# Patient Record
Sex: Female | Born: 1935
Health system: Southern US, Community
[De-identification: ages and names within clinical notes are randomized; demographics above are authoritative.]

## PROBLEM LIST (undated history)

## (undated) DIAGNOSIS — I639 Cerebral infarction, unspecified: Secondary | ICD-10-CM

## (undated) DIAGNOSIS — K3532 Acute appendicitis with perforation and localized peritonitis, without abscess: Secondary | ICD-10-CM

## (undated) DIAGNOSIS — T148XXA Other injury of unspecified body region, initial encounter: Secondary | ICD-10-CM

## (undated) DIAGNOSIS — I4891 Unspecified atrial fibrillation: Secondary | ICD-10-CM

## (undated) DIAGNOSIS — F419 Anxiety disorder, unspecified: Secondary | ICD-10-CM

## (undated) DIAGNOSIS — F329 Major depressive disorder, single episode, unspecified: Secondary | ICD-10-CM

## (undated) DIAGNOSIS — M810 Age-related osteoporosis without current pathological fracture: Secondary | ICD-10-CM

## (undated) HISTORY — DX: Acute appendicitis with perforation and localized peritonitis, without abscess: K35.32

## (undated) HISTORY — DX: Major depressive disorder, single episode, unspecified: F32.9

## (undated) HISTORY — PX: PACEMAKER INSERTION: SHX728

## (undated) HISTORY — DX: Age-related osteoporosis without current pathological fracture: M81.0

## (undated) HISTORY — DX: Other injury of unspecified body region, initial encounter: T14.8XXA

## (undated) HISTORY — PX: INSERT / REPLACE / REMOVE PACEMAKER: SUR710

## (undated) HISTORY — DX: Anxiety disorder, unspecified: F41.9

---
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
## 2006-07-06 DIAGNOSIS — I639 Cerebral infarction, unspecified: Secondary | ICD-10-CM

## 2006-07-06 DIAGNOSIS — I4891 Unspecified atrial fibrillation: Secondary | ICD-10-CM

## 2006-07-06 HISTORY — DX: Cerebral infarction, unspecified: I63.9

## 2006-07-06 HISTORY — DX: Unspecified atrial fibrillation: I48.91

## 2011-05-18 LAB — PROTIME-INR: INR: 2.8 — AB (ref 0.9–1.1)

## 2011-11-21 ENCOUNTER — Inpatient Hospital Stay (HOSPITAL_COMMUNITY)
Admission: EM | Admit: 2011-11-21 | Discharge: 2011-11-27 | DRG: 340 | Disposition: A | Discharge status: Home / Self Care | Payer: Medicare Other | Source: Ambulatory Visit | Attending: Surgery | Admitting: Surgery

## 2011-11-21 ENCOUNTER — Emergency Department (HOSPITAL_COMMUNITY): Payer: Medicare Other

## 2011-11-21 ENCOUNTER — Encounter (HOSPITAL_COMMUNITY): Payer: Self-pay | Admitting: *Deleted

## 2011-11-21 DIAGNOSIS — Z950 Presence of cardiac pacemaker: Secondary | ICD-10-CM

## 2011-11-21 DIAGNOSIS — Z87891 Personal history of nicotine dependence: Secondary | ICD-10-CM

## 2011-11-21 DIAGNOSIS — K37 Unspecified appendicitis: Secondary | ICD-10-CM

## 2011-11-21 DIAGNOSIS — Z79899 Other long term (current) drug therapy: Secondary | ICD-10-CM

## 2011-11-21 DIAGNOSIS — K3532 Acute appendicitis with perforation and localized peritonitis, without abscess: Secondary | ICD-10-CM

## 2011-11-21 DIAGNOSIS — I482 Chronic atrial fibrillation, unspecified: Secondary | ICD-10-CM

## 2011-11-21 DIAGNOSIS — Z7901 Long term (current) use of anticoagulants: Secondary | ICD-10-CM

## 2011-11-21 DIAGNOSIS — I4891 Unspecified atrial fibrillation: Secondary | ICD-10-CM | POA: Diagnosis present

## 2011-11-21 DIAGNOSIS — Z8673 Personal history of transient ischemic attack (TIA), and cerebral infarction without residual deficits: Secondary | ICD-10-CM

## 2011-11-21 DIAGNOSIS — K3533 Acute appendicitis with perforation and localized peritonitis, with abscess: Principal | ICD-10-CM | POA: Diagnosis present

## 2011-11-21 HISTORY — DX: Unspecified atrial fibrillation: I48.91

## 2011-11-21 HISTORY — DX: Cerebral infarction, unspecified: I63.9

## 2011-11-21 LAB — DIFFERENTIAL
Basophils Absolute: 0.1 10*3/uL (ref 0.0–0.1)
Basophils Relative: 0 % (ref 0–1)
Lymphocytes Relative: 14 % (ref 12–46)
Monocytes Absolute: 1.2 10*3/uL — ABNORMAL HIGH (ref 0.1–1.0)
Monocytes Relative: 8 % (ref 3–12)
Neutro Abs: 12.3 10*3/uL — ABNORMAL HIGH (ref 1.7–7.7)
Neutrophils Relative %: 78 % — ABNORMAL HIGH (ref 43–77)

## 2011-11-21 LAB — COMPREHENSIVE METABOLIC PANEL
AST: 17 U/L (ref 0–37)
Albumin: 3.5 g/dL (ref 3.5–5.2)
Alkaline Phosphatase: 62 U/L (ref 39–117)
CO2: 26 mEq/L (ref 19–32)
Chloride: 100 mEq/L (ref 96–112)
GFR calc non Af Amer: 44 mL/min — ABNORMAL LOW (ref >90)
Potassium: 4.1 mEq/L (ref 3.5–5.1)
Total Bilirubin: 0.6 mg/dL (ref 0.3–1.2)

## 2011-11-21 LAB — CBC
HCT: 37.6 % (ref 36.0–46.0)
Hemoglobin: 12.4 g/dL (ref 12.0–15.0)
MCHC: 33 g/dL (ref 30.0–36.0)
RDW: 12.9 % (ref 11.5–15.5)
WBC: 15.8 10*3/uL — ABNORMAL HIGH (ref 4.0–10.5)

## 2011-11-21 LAB — URINALYSIS, ROUTINE W REFLEX MICROSCOPIC
Bilirubin Urine: NEGATIVE
Glucose, UA: NEGATIVE mg/dL
Hgb urine dipstick: NEGATIVE
Ketones, ur: 15 mg/dL — AB
Protein, ur: NEGATIVE mg/dL
pH: 6 (ref 5.0–8.0)

## 2011-11-21 LAB — APTT: aPTT: 38 seconds — ABNORMAL HIGH (ref 24–37)

## 2011-11-21 LAB — DIGOXIN LEVEL: Digoxin Level: 1 ng/mL (ref 0.8–2.0)

## 2011-11-21 LAB — POCT I-STAT TROPONIN I: Troponin i, poc: 0 ng/mL (ref 0.00–0.08)

## 2011-11-21 LAB — URINE MICROSCOPIC-ADD ON

## 2011-11-21 MED ORDER — IOHEXOL 300 MG/ML  SOLN
80.0000 mL | Freq: Once | INTRAMUSCULAR | Status: AC | PRN
  Administered 2011-11-21: 80 mL via INTRAVENOUS

## 2011-11-21 MED ORDER — SODIUM CHLORIDE 0.9 % IV SOLN
1.0000 g | INTRAVENOUS | Status: AC
  Administered 2011-11-21: 1 g via INTRAVENOUS
  Filled 2011-11-21: qty 1

## 2011-11-21 MED ORDER — ONDANSETRON HCL 4 MG/2ML IJ SOLN
4.0000 mg | Freq: Once | INTRAMUSCULAR | Status: AC
  Administered 2011-11-21: 4 mg via INTRAVENOUS
  Filled 2011-11-21: qty 2

## 2011-11-21 MED ORDER — MORPHINE SULFATE 4 MG/ML IJ SOLN
4.0000 mg | Freq: Once | INTRAMUSCULAR | Status: AC
  Administered 2011-11-21: 4 mg via INTRAVENOUS
  Filled 2011-11-21: qty 1

## 2011-11-21 MED ORDER — IOHEXOL 300 MG/ML  SOLN
20.0000 mL | INTRAMUSCULAR | Status: AC
  Administered 2011-11-21: 20 mL via ORAL

## 2011-11-21 MED ORDER — SODIUM CHLORIDE 0.9 % IV BOLUS (SEPSIS)
500.0000 mL | Freq: Once | INTRAVENOUS | Status: AC
  Administered 2011-11-21: 500 mL via INTRAVENOUS

## 2011-11-21 NOTE — ED Provider Notes (Signed)
History     CSN: [id number]  Arrival date & time 11/21/11  1933   First MD Initiated Contact with Patient 11/21/11 1959      Chief Complaint  Patient presents with  . Abdominal Pain    (Consider location/radiation/quality/duration/timing/severity/associated sxs/prior treatment) HPI Pt with abd pain x 3-4 days localizing to RLQ. Anorexia. No N/V/D. No fever. Normal BM w/o blood in stool. No urinary complaints. No prev abd surgeries Past Medical History  Diagnosis Date  . CVA (cerebral infarction)   . Atrial fibrillation     Past Surgical History  Procedure Date  . Pacemaker insertion     History reviewed. No pertinent family history.  History  Substance Use Topics  . Smoking status: Never Smoker   . Smokeless tobacco: Not on file  . Alcohol Use: No    OB History    Grav Para Term Preterm Abortions TAB SAB Ect Mult Living                  Review of Systems  Constitutional: Negative for fever and chills.  Respiratory: Negative for shortness of breath.   Cardiovascular: Negative for chest pain.  Gastrointestinal: Positive for abdominal pain. Negative for nausea, vomiting, diarrhea, constipation and blood in stool.  Genitourinary: Negative for dysuria.  Neurological: Negative for weakness and numbness.    Allergies  Review of patient's allergies indicates no known allergies.  Home Medications   Current Outpatient Rx  Name Route Sig Dispense Refill  . AMIODARONE HCL PO Oral Take 1 tablet by mouth daily.    Marland Kitchen VITAMIN D 1000 UNITS PO TABS Oral Take 1,000 Units by mouth daily.    Marland Kitchen CO-ENZYME Q-10 50 MG PO CAPS Oral Take 50 mg by mouth daily.    Marland Kitchen DIGOXIN PO Oral Take 1 tablet by mouth daily.    Marland Kitchen METOPROLOL TARTRATE PO Oral Take 1 tablet by mouth 2 (two) times daily.    Marland Kitchen VITAMIN C 500 MG PO TABS Oral Take 500 mg by mouth daily.    Tomi Bamberger SODIUM PO Oral Take 1 tablet by mouth daily.      BP 95/84  Pulse 78  Temp(Src) 99.1 F (37.3 C) (Oral)  Resp 25   SpO2 94%  Physical Exam  Nursing note and vitals reviewed. Constitutional: She is oriented to person, place, and time. She appears well-developed and well-nourished. No distress.  HENT:  Head: Normocephalic and atraumatic.  Mouth/Throat: Oropharynx is clear and moist.  Eyes: EOM are normal. Pupils are equal, round, and reactive to light.  Neck: Normal range of motion. Neck supple.  Cardiovascular: Normal rate and regular rhythm.   Pulmonary/Chest: Effort normal and breath sounds normal. No respiratory distress. She has no wheezes. She has no rales.  Abdominal: Soft. Bowel sounds are normal. She exhibits distension. There is tenderness (point tnederness in RLQ without R/G). There is no rebound and no guarding.  Musculoskeletal: Normal range of motion. She exhibits no edema and no tenderness.  Neurological: She is alert and oriented to person, place, and time.  Skin: Skin is warm and dry. No rash noted. No erythema.  Psychiatric: She has a normal mood and affect. Her behavior is normal.    ED Course  Procedures (including critical care time)  Labs Reviewed  CBC - Abnormal; Notable for the following:    WBC 15.8 (*)    All other components within normal limits  DIFFERENTIAL - Abnormal; Notable for the following:    Neutrophils Relative 78 (*)  Neutro Abs 12.3 (*)    Monocytes Absolute 1.2 (*)    All other components within normal limits  COMPREHENSIVE METABOLIC PANEL - Abnormal; Notable for the following:    Glucose, Bld 101 (*)    Creatinine, Ser 1.15 (*)    GFR calc non Af Amer 44 (*)    GFR calc Af Amer 51 (*)    All other components within normal limits  URINALYSIS, ROUTINE W REFLEX MICROSCOPIC - Abnormal; Notable for the following:    Ketones, ur 15 (*)    Leukocytes, UA SMALL (*)    All other components within normal limits  PROTIME-INR - Abnormal; Notable for the following:    Prothrombin Time 21.1 (*)    INR 1.79 (*)    All other components within normal limits    APTT - Abnormal; Notable for the following:    aPTT 38 (*)    All other components within normal limits  URINE MICROSCOPIC-ADD ON - Abnormal; Notable for the following:    Squamous Epithelial / LPF FEW (*)    Bacteria, UA FEW (*)    All other components within normal limits  LIPASE, BLOOD  DIGOXIN LEVEL  POCT I-STAT TROPONIN I  PREPARE FRESH FROZEN PLASMA   Ct Abdomen Pelvis W Contrast  11/21/2011  *RADIOLOGY REPORT*  Clinical Data: Right lower quadrant pain  CT ABDOMEN AND PELVIS WITH CONTRAST  Technique:  Multidetector CT imaging of the abdomen and pelvis was performed following the standard protocol during bolus administration of intravenous contrast.  Contrast: 45mL OMNIPAQUE IOHEXOL 300 MG/ML  SOLN  Comparison: None.  Findings: Minimal dependent scarring versus atelectasis.  Partially imaged pacer wires within the right atrium and right ventricle. Mild cardiomegaly.  No pleural or pericardial effusion.  Unremarkable liver.  Layering high attenuation within the gallbladder may reflect stones or sludge.  Unremarkable spleen, pancreas, adrenal glands.  Lobular renal contours with bilateral too small further characterize hypodensities.  Mild pelvicaliectasis bilaterally without overt hydroureter.  Findings are suggestive of acute appendicitis with perforation. 3.2 x 2.2 cm debris/fluid collection within the right lower quadrant may reflect early abscess formation.  There are reactive ileocolic lymph nodes.  No lymphadenopathy.  There is scattered atherosclerotic calcification of the aorta and its branches. No aneurysmal dilatation.  Thin-walled bladder.  Fat containing right inguinal hernia. Nonspecific CT appearance to the uterus and adnexa. Endometrial cavity appears mildly prominent.  IMPRESSION: Acute appendicitis with perforation.  Extraluminal loculated collection within the right lower quadrant is most in keeping with early abscess formation.  Endometrial prominence, nonspecific on CT. Can be  further evaluated with a non emergent pelvic ultrasound follow-up.  Discussed via telephone with Dr. Lita Mains at 10:20 p.m. on 11/21/2011.  Original Report Authenticated By: Suanne Marker, M.D.     1. Appendicitis       MDM   Reviewed results with pt and will discuss with surgery.   Discussed with Dr Barry Dienes. Asked to have 2 units FFP given and 1 gm Ertapenem in ED. Will see in ED.   Julianne Rice, MD 11/21/11 [phone]

## 2011-11-21 NOTE — ED Notes (Signed)
Pt returned to room from CT

## 2011-11-21 NOTE — ED Notes (Signed)
Patient with abdominal pain for about a week.  Patient abdomen is distended and pain is generalized.  Patient is moving her bowel fine.  Denies diarrhea and her appetite has decreased.  Feels like she has pressure and denies nausea or vomiting

## 2011-11-21 NOTE — ED Notes (Signed)
Pt to CT scan.

## 2011-11-22 ENCOUNTER — Encounter (HOSPITAL_COMMUNITY): Payer: Self-pay | Admitting: Anesthesiology

## 2011-11-22 ENCOUNTER — Encounter (HOSPITAL_COMMUNITY): Admission: EM | Disposition: A | Discharge status: Home / Self Care | Payer: Self-pay | Source: Ambulatory Visit

## 2011-11-22 ENCOUNTER — Emergency Department (HOSPITAL_COMMUNITY): Payer: Medicare Other | Admitting: Anesthesiology

## 2011-11-22 ENCOUNTER — Encounter (HOSPITAL_COMMUNITY): Payer: Self-pay | Admitting: *Deleted

## 2011-11-22 DIAGNOSIS — K352 Acute appendicitis with generalized peritonitis, without abscess: Secondary | ICD-10-CM

## 2011-11-22 DIAGNOSIS — K35209 Acute appendicitis with generalized peritonitis, without abscess, unspecified as to perforation: Secondary | ICD-10-CM

## 2011-11-22 DIAGNOSIS — K3532 Acute appendicitis with perforation and localized peritonitis, without abscess: Secondary | ICD-10-CM

## 2011-11-22 DIAGNOSIS — I482 Chronic atrial fibrillation, unspecified: Secondary | ICD-10-CM

## 2011-11-22 HISTORY — PX: LAPAROSCOPIC APPENDECTOMY: SHX408

## 2011-11-22 HISTORY — DX: Acute appendicitis with perforation, localized peritonitis, and gangrene, without abscess: K35.32

## 2011-11-22 LAB — CBC
HCT: 32.9 % — ABNORMAL LOW (ref 36.0–46.0)
Hemoglobin: 10.6 g/dL — ABNORMAL LOW (ref 12.0–15.0)
MCV: 95.9 fL (ref 78.0–100.0)
WBC: 12.2 10*3/uL — ABNORMAL HIGH (ref 4.0–10.5)

## 2011-11-22 LAB — CREATININE, SERUM
GFR calc Af Amer: 66 mL/min — ABNORMAL LOW (ref >90)
GFR calc non Af Amer: 57 mL/min — ABNORMAL LOW (ref >90)

## 2011-11-22 LAB — TYPE AND SCREEN: Antibody Screen: NEGATIVE

## 2011-11-22 SURGERY — APPENDECTOMY, LAPAROSCOPIC
Anesthesia: General | Site: Abdomen | Wound class: Dirty or Infected

## 2011-11-22 MED ORDER — LORAZEPAM 2 MG/ML IJ SOLN: 1.0000 mg | Freq: Once | INTRAMUSCULAR | Status: DC | PRN

## 2011-11-22 MED ORDER — SUCCINYLCHOLINE CHLORIDE 20 MG/ML IJ SOLN
INTRAMUSCULAR | Status: DC | PRN
  Administered 2011-11-22: 80 mg via INTRAVENOUS

## 2011-11-22 MED ORDER — SODIUM CHLORIDE 0.9 % IV SOLN
1.0000 g | INTRAVENOUS | Status: DC
  Administered 2011-11-23 – 2011-11-26 (×5): 1 g via INTRAVENOUS
  Filled 2011-11-22 (×6): qty 1

## 2011-11-22 MED ORDER — HYDROCODONE-ACETAMINOPHEN 5-325 MG PO TABS
1.0000 | ORAL_TABLET | ORAL | Status: DC | PRN
  Administered 2011-11-22: 1 via ORAL
  Administered 2011-11-23: 2 via ORAL
  Administered 2011-11-24: 1 via ORAL
  Administered 2011-11-25 – 2011-11-26 (×4): 2 via ORAL
  Filled 2011-11-22 (×2): qty 2
  Filled 2011-11-22 (×2): qty 1
  Filled 2011-11-22 (×3): qty 2

## 2011-11-22 MED ORDER — HYDROMORPHONE HCL PF 1 MG/ML IJ SOLN
0.2500 mg | INTRAMUSCULAR | Status: DC | PRN
  Administered 2011-11-22 (×2): 0.5 mg via INTRAVENOUS

## 2011-11-22 MED ORDER — ONDANSETRON HCL 4 MG/2ML IJ SOLN: 4.0000 mg | Freq: Four times a day (QID) | INTRAMUSCULAR | Status: DC | PRN

## 2011-11-22 MED ORDER — BUPIVACAINE-EPINEPHRINE 0.25% -1:200000 IJ SOLN
INTRAMUSCULAR | Status: DC | PRN
  Administered 2011-11-22: 6 mL

## 2011-11-22 MED ORDER — PROPOFOL 10 MG/ML IV BOLUS
INTRAVENOUS | Status: DC | PRN
  Administered 2011-11-22: 100 mg via INTRAVENOUS

## 2011-11-22 MED ORDER — FENTANYL CITRATE 0.05 MG/ML IJ SOLN: 50.0000 ug | INTRAMUSCULAR | Status: DC | PRN

## 2011-11-22 MED ORDER — GLYCOPYRROLATE 0.2 MG/ML IJ SOLN
INTRAMUSCULAR | Status: DC | PRN
  Administered 2011-11-22: .4 mg via INTRAVENOUS

## 2011-11-22 MED ORDER — METOPROLOL TARTRATE 25 MG PO TABS
25.0000 mg | ORAL_TABLET | Freq: Two times a day (BID) | ORAL | Status: DC
  Administered 2011-11-22 – 2011-11-27 (×10): 25 mg via ORAL
  Filled 2011-11-22 (×12): qty 1

## 2011-11-22 MED ORDER — DIGOXIN 125 MCG PO TABS
0.1250 mg | ORAL_TABLET | Freq: Every day | ORAL | Status: DC
  Administered 2011-11-22 – 2011-11-27 (×6): 0.125 mg via ORAL
  Filled 2011-11-22 (×6): qty 1

## 2011-11-22 MED ORDER — ROCURONIUM BROMIDE 100 MG/10ML IV SOLN
INTRAVENOUS | Status: DC | PRN
  Administered 2011-11-22: 40 mg via INTRAVENOUS

## 2011-11-22 MED ORDER — DEXTROSE IN LACTATED RINGERS 5 % IV SOLN: INTRAVENOUS | Status: DC

## 2011-11-22 MED ORDER — NEOSTIGMINE METHYLSULFATE 1 MG/ML IJ SOLN
INTRAMUSCULAR | Status: DC | PRN
  Administered 2011-11-22: 2.5 mg via INTRAVENOUS

## 2011-11-22 MED ORDER — ONDANSETRON HCL 4 MG PO TABS: 4.0000 mg | ORAL_TABLET | Freq: Four times a day (QID) | ORAL | Status: DC | PRN

## 2011-11-22 MED ORDER — DEXTROSE 5 % IV SOLN: 1.0000 g | Freq: Once | INTRAVENOUS | Status: DC

## 2011-11-22 MED ORDER — MIDAZOLAM HCL 2 MG/2ML IJ SOLN: 1.0000 mg | INTRAMUSCULAR | Status: DC | PRN

## 2011-11-22 MED ORDER — ENOXAPARIN SODIUM 40 MG/0.4ML ~~LOC~~ SOLN
40.0000 mg | SUBCUTANEOUS | Status: DC
  Administered 2011-11-23 – 2011-11-27 (×5): 40 mg via SUBCUTANEOUS
  Filled 2011-11-22 (×6): qty 0.4

## 2011-11-22 MED ORDER — SODIUM CHLORIDE 0.9 % IV SOLN
INTRAVENOUS | Status: DC | PRN
  Administered 2011-11-22 (×2): via INTRAVENOUS

## 2011-11-22 MED ORDER — ONDANSETRON HCL 4 MG/2ML IJ SOLN
INTRAMUSCULAR | Status: DC | PRN
  Administered 2011-11-22: 4 mg via INTRAVENOUS

## 2011-11-22 MED ORDER — AMIODARONE HCL 100 MG PO TABS
100.0000 mg | ORAL_TABLET | Freq: Every day | ORAL | Status: DC
  Administered 2011-11-22 – 2011-11-27 (×6): 100 mg via ORAL
  Filled 2011-11-22 (×6): qty 1

## 2011-11-22 MED ORDER — MORPHINE SULFATE 2 MG/ML IJ SOLN
1.0000 mg | INTRAMUSCULAR | Status: DC | PRN
  Administered 2011-11-22 – 2011-11-23 (×3): 2 mg via INTRAVENOUS
  Administered 2011-11-23: 1 mg via INTRAVENOUS
  Filled 2011-11-22 (×4): qty 1

## 2011-11-22 MED ORDER — SODIUM CHLORIDE 0.9 % IR SOLN
Status: DC | PRN
  Administered 2011-11-22: 1000 mL

## 2011-11-22 MED ORDER — ACETAMINOPHEN 10 MG/ML IV SOLN
1000.0000 mg | Freq: Four times a day (QID) | INTRAVENOUS | Status: AC
  Administered 2011-11-22 (×3): 1000 mg via INTRAVENOUS
  Filled 2011-11-22 (×4): qty 100

## 2011-11-22 MED ORDER — PHENYLEPHRINE HCL 10 MG/ML IJ SOLN
INTRAMUSCULAR | Status: DC | PRN
  Administered 2011-11-22: 80 ug via INTRAVENOUS

## 2011-11-22 MED ORDER — KCL IN DEXTROSE-NACL 20-5-0.45 MEQ/L-%-% IV SOLN
INTRAVENOUS | Status: AC
  Filled 2011-11-22: qty 1000

## 2011-11-22 MED ORDER — FENTANYL CITRATE 0.05 MG/ML IJ SOLN
INTRAMUSCULAR | Status: DC | PRN
  Administered 2011-11-22: 50 ug via INTRAVENOUS

## 2011-11-22 MED ORDER — KCL IN DEXTROSE-NACL 20-5-0.45 MEQ/L-%-% IV SOLN
INTRAVENOUS | Status: DC
  Administered 2011-11-22 – 2011-11-24 (×4): via INTRAVENOUS
  Filled 2011-11-22 (×12): qty 1000

## 2011-11-22 SURGICAL SUPPLY — 43 items
APPLICATOR COTTON TIP 6IN STRL (MISCELLANEOUS) ×2 IMPLANT
APPLIER CLIP ROT 10 11.4 M/L (STAPLE)
BLADE SURG ROTATE 9660 (MISCELLANEOUS) IMPLANT
CANISTER SUCTION 2500CC (MISCELLANEOUS) ×2 IMPLANT
CHLORAPREP W/TINT 26ML (MISCELLANEOUS) ×2 IMPLANT
CLIP APPLIE ROT 10 11.4 M/L (STAPLE) IMPLANT
CLOTH BEACON ORANGE TIMEOUT ST (SAFETY) ×2 IMPLANT
COVER SURGICAL LIGHT HANDLE (MISCELLANEOUS) ×2 IMPLANT
CUTTER FLEX LINEAR 45M (STAPLE) ×2 IMPLANT
DERMABOND ADVANCED (GAUZE/BANDAGES/DRESSINGS) ×1
DERMABOND ADVANCED .7 DNX12 (GAUZE/BANDAGES/DRESSINGS) ×1 IMPLANT
DRAPE UTILITY 15X26 W/TAPE STR (DRAPE) ×4 IMPLANT
DRAPE WARM FLUID 44X44 (DRAPE) ×2 IMPLANT
ELECT REM PT RETURN 9FT ADLT (ELECTROSURGICAL) ×2
ELECTRODE REM PT RTRN 9FT ADLT (ELECTROSURGICAL) ×1 IMPLANT
ENDOLOOP SUT PDS II  0 18 (SUTURE)
ENDOLOOP SUT PDS II 0 18 (SUTURE) IMPLANT
EVACUATOR SILICONE 100CC (DRAIN) ×2 IMPLANT
GLOVE BIO SURGEON STRL SZ 6 (GLOVE) ×2 IMPLANT
GLOVE BIOGEL PI IND STRL 6.5 (GLOVE) ×1 IMPLANT
GLOVE BIOGEL PI INDICATOR 6.5 (GLOVE) ×1
GOWN PREVENTION PLUS XXLARGE (GOWN DISPOSABLE) ×4 IMPLANT
GOWN STRL NON-REIN LRG LVL3 (GOWN DISPOSABLE) ×4 IMPLANT
KIT BASIN OR (CUSTOM PROCEDURE TRAY) ×2 IMPLANT
KIT ROOM TURNOVER OR (KITS) ×2 IMPLANT
NS IRRIG 1000ML POUR BTL (IV SOLUTION) ×2 IMPLANT
PAD ARMBOARD 7.5X6 YLW CONV (MISCELLANEOUS) ×4 IMPLANT
POUCH SPECIMEN RETRIEVAL 10MM (ENDOMECHANICALS) ×2 IMPLANT
RELOAD STAPLE TA45 3.5 REG BLU (ENDOMECHANICALS) ×4 IMPLANT
SCALPEL HARMONIC ACE (MISCELLANEOUS) ×2 IMPLANT
SET IRRIG TUBING LAPAROSCOPIC (IRRIGATION / IRRIGATOR) ×2 IMPLANT
SLEEVE ENDOPATH XCEL 5M (ENDOMECHANICALS) ×2 IMPLANT
SPECIMEN JAR SMALL (MISCELLANEOUS) ×2 IMPLANT
SUT ETHILON 2 0 FS 18 (SUTURE) ×2 IMPLANT
SUT MNCRL AB 4-0 PS2 18 (SUTURE) ×2 IMPLANT
TOWEL OR 17X24 6PK STRL BLUE (TOWEL DISPOSABLE) ×2 IMPLANT
TOWEL OR 17X26 10 PK STRL BLUE (TOWEL DISPOSABLE) ×2 IMPLANT
TRAY FOLEY CATH 14FR (SET/KITS/TRAYS/PACK) ×2 IMPLANT
TRAY LAPAROSCOPIC (CUSTOM PROCEDURE TRAY) ×2 IMPLANT
TROCAR BLADELESS 12MM (ENDOMECHANICALS) ×2 IMPLANT
TROCAR XCEL BLUNT TIP 100MML (ENDOMECHANICALS) ×2 IMPLANT
TROCAR XCEL NON-BLD 5MMX100MML (ENDOMECHANICALS) ×2 IMPLANT
WATER STERILE IRR 1000ML POUR (IV SOLUTION) IMPLANT

## 2011-11-22 NOTE — Op Note (Signed)
Laparoscopic Appendectomy Procedure Note  Indications: The patient presented with a history of right-sided abdominal pain. A CT scan revealed findings consistent with acute appendicitis with abscess.  Pre-operative Diagnosis: Acute appendicitis with peritoneal abscess  Post-operative Diagnosis: perforated gangrenous appendicitis with abscess  Surgeon: Stark Klein   Assistants: none  Anesthesia: General endotracheal anesthesia and Local anesthesia 1% buffered lidocaine, 0.25.% bupivacaine, with epinephrine  ASA Class: 3E  Procedure Details  The patient was seen again in the Holding Room. The risks, benefits, complications, treatment options, and expected outcomes were discussed with the patient and/or family. The possibilities of perforation of viscus, bleeding, recurrent infection, the need for additional procedures, failure to diagnose a condition, and creating a complication requiring transfusion or operation were discussed. There was concurrence with the proposed plan and informed consent was obtained. The site of surgery was properly noted. The patient was taken to Operating Room, identified as [name] and the procedure verified as Appendectomy. A Time Out was held and the above information confirmed.  The patient was placed in the supine position and general anesthesia was induced, along with placement of orogastric tube, Venodyne boots, and a Foley catheter. The abdomen was prepped and draped in a sterile fashion. The patient was placed into reverse trendelenburg position and rotated to the right.  Local anesthetic was infiltrated into the left costal margin.  A 5 mm incision was made with the #11 blade.  The 5 mm optiview port was used to access the abdominal cavity.  The pneumoperitoneum was then established to steady pressure of 15 mmHg.  Additional 5 mm cannulas then placed in the left lower quadrant of the abdomen and the suprapubic region under direct visualization. The left  subcostal incision was upsized to a 12 mm port.  A careful evaluation of the entire abdomen was carried out. The patient was placed in Trendelenburg and rotated to the left.  . The small intestines were retracted in the cephalad and left lateral direction away from the pelvis and right lower quadrant. The patient's omentum was adherent to the RLQ abdominal wall.  When the omentum was dissected free with the harmonic scalpel and with blunt dissection, an abscess was entered.  This was aspirated and irrigated.  The appendiceal tip was visible with multiple fecaliths present in the abscess.  The appendiceal tip and appendicaliths were removed in an Endocatch bag.  The appendiceal artery was cauterized with the Harmonic scalpel.  The remainder of the appendix was then identified.   The appendix was carefully dissected, but was very friable.  . The appendix was was skeletonized with the harmonic scalpel.   The appendix was divided close to its base using an endo-GIA stapler. Some necrotic appendiceal stump was still present.  The cecum was dissected free some more and an additional staple load was fired across the base of the appendix. This additional appendiceal tissue was removed from the abdomen with an Endocatch bag through the left subcostal port.  There was no evidence of bleeding, leakage, or complication after division of the appendix. Irrigation was also performed and irrigate suctioned from the abdomen as well. A 19 Fr Blake drain was placed in the RLQ and pulled out the LLQ port site. This was secured with a 2-0 Nylon.  The 5 mm trocars were removed.  The pneumoperitoneum was evacuated from the abdomen.    The trocar site skin wounds were closed with 4-0 Monocryl and dressed with Dermabond.  Instrument, sponge, and needle counts were correct at the  conclusion of the case.   Findings: The appendix was found to be inflamed. There were signs of necrosis.  There was perforation. There was abscess  formation.  Estimated Blood Loss:  less than 50 mL         Drains: 19 Fr Blake         Specimens: Appendix in pieces         Complications:  None; patient tolerated the procedure well.         Disposition: PACU - hemodynamically stable.         Condition: stable

## 2011-11-22 NOTE — Anesthesia Procedure Notes (Signed)
Procedure Name: Intubation Date/Time: 11/22/2011 3:46 AM Performed by: Valetta Fuller Pre-anesthesia Checklist: Patient identified, Emergency Drugs available, Suction available and Patient being monitored Patient Re-evaluated:Patient Re-evaluated prior to inductionOxygen Delivery Method: Circle system utilized Preoxygenation: Pre-oxygenation with 100% oxygen Intubation Type: IV induction, Rapid sequence and Cricoid Pressure applied Laryngoscope Size: Miller and 2 Grade View: Grade II Tube type: Oral Tube size: 7.5 mm Number of attempts: 1 Airway Equipment and Method: Stylet Placement Confirmation: ETT inserted through vocal cords under direct vision,  positive ETCO2 and breath sounds checked- equal and bilateral Secured at: 21 cm Tube secured with: Tape Dental Injury: Teeth and Oropharynx as per pre-operative assessment

## 2011-11-22 NOTE — Anesthesia Postprocedure Evaluation (Signed)
  Anesthesia Post-op Note  Patient: Tracy Vasquez  Procedure(s) Performed: Procedure(s) (LRB): APPENDECTOMY LAPAROSCOPIC (N/A)  Patient Location: PACU  Anesthesia Type: General  Level of Consciousness: awake  Airway and Oxygen Therapy: Patient Spontanous Breathing  Post-op Pain: mild  Post-op Assessment: Post-op Vital signs reviewed, Patient's Cardiovascular Status Stable, Respiratory Function Stable, Patent Airway, No signs of Nausea or vomiting and Pain level controlled  Post-op Vital Signs: stable  Complications: No apparent anesthesia complications

## 2011-11-22 NOTE — Progress Notes (Signed)
Day of Surgery  Subjective: She feels okay today. No nausea. Pain well controlled. She was wondering where the incisions were for her surgery but they're all small since this was done laparoscopically. No particular complaints. No nausea so far.  Objective: Vital signs in last 24 hours: Temp:  [96.8 F (36 C)-99.6 F (37.6 C)] 98 F (36.7 C) (05/19 0645) Pulse Rate:  [70-89] 80  (05/19 0645) Resp:  [11-27] 14  (05/19 0645) BP: (95-133)/(39-84) 131/45 mmHg (05/19 0645) SpO2:  [90 %-100 %] 96 % (05/19 0645)   Intake/Output from previous day: 05/18 0701 - 05/19 0700 In: 1200 [I.V.:900; Blood:300] Out: 360 [Urine:300; Drains:60] Intake/Output this shift: Total I/O In: 120 [P.O.:120] Out: 0    General appearance: alert, cooperative, appears stated age and no distress Resp: clear to auscultation bilaterally GI: he had a soft but somewhat distended. Not particularly tender. Rare bowel sound is heard.  Incision: healing well, Drain has a fair amount of serous material  Lab Results:   Basename 11/22/11 0921 11/21/11 2007  WBC 12.2* 15.8*  HGB 10.6* 12.4  HCT 32.9* 37.6  PLT 205 249   BMET  Basename 11/22/11 0921 11/21/11 2007  NA -- 137  K -- 4.1  CL -- 100  CO2 -- 26  GLUCOSE -- 101*  BUN -- 15  CREATININE 0.93 1.15*  CALCIUM -- 9.1   PT/INR  Basename 11/21/11 2007  LABPROT 21.1*  INR 1.79*   ABG No results found for this basename: PHART:2,PCO2:2,PO2:2,HCO3:2 in the last 72 hours  MEDS, Scheduled    . acetaminophen  1,000 mg Intravenous Q6H  . amiodarone  100 mg Oral Daily  . dextrose 5 % and 0.45 % NaCl with KCl 20 mEq/L      . digoxin  0.125 mg Oral Daily  . enoxaparin  40 mg Subcutaneous Q24H  . ertapenem  1 g Intravenous To Major  . ertapenem [facility]) IV  1 g Intravenous Q24H  . iohexol  20 mL Oral Q1 Hr x 2  . metoprolol tartrate  25 mg Oral BID  .  morphine injection  4 mg Intravenous Once  . ondansetron  4 mg Intravenous Once  . sodium  chloride  500 mL Intravenous Once  . DISCONTD: cefOXitin  1 g Intravenous Once    Studies/Results: Ct Abdomen Pelvis W Contrast  11/21/2011  *RADIOLOGY REPORT*  Clinical Data: Right lower quadrant pain  CT ABDOMEN AND PELVIS WITH CONTRAST  Technique:  Multidetector CT imaging of the abdomen and pelvis was performed following the standard protocol during bolus administration of intravenous contrast.  Contrast: 27mL OMNIPAQUE IOHEXOL 300 MG/ML  SOLN  Comparison: None.  Findings: Minimal dependent scarring versus atelectasis.  Partially imaged pacer wires within the right atrium and right ventricle. Mild cardiomegaly.  No pleural or pericardial effusion.  Unremarkable liver.  Layering high attenuation within the gallbladder may reflect stones or sludge.  Unremarkable spleen, pancreas, adrenal glands.  Lobular renal contours with bilateral too small further characterize hypodensities.  Mild pelvicaliectasis bilaterally without overt hydroureter.  Findings are suggestive of acute appendicitis with perforation. 3.2 x 2.2 cm debris/fluid collection within the right lower quadrant may reflect early abscess formation.  There are reactive ileocolic lymph nodes.  No lymphadenopathy.  There is scattered atherosclerotic calcification of the aorta and its branches. No aneurysmal dilatation.  Thin-walled bladder.  Fat containing right inguinal hernia. Nonspecific CT appearance to the uterus and adnexa. Endometrial cavity appears mildly prominent.  IMPRESSION: Acute appendicitis with perforation.  Extraluminal loculated collection within the right lower quadrant is most in keeping with early abscess formation.  Endometrial prominence, nonspecific on CT. Can be further evaluated with a non emergent pelvic ultrasound follow-up.  Discussed via telephone with Dr. Lita Mains at 10:20 p.m. on 11/21/2011.  Original Report Authenticated By: Suanne Marker, M.D.    Assessment: s/p Procedure(s): APPENDECTOMY  LAPAROSCOPIC Stable postop but need to go slow on diet advancement since she had perforation and continues to have abdominal distention  Plan: no change today. We'll continue her on IV fluids and antibiotics.we will try to ambulate her more today.   LOS: 1 day     Haywood Lasso, MD, [facility], [place] [phone]   11/22/2011 10:41 AM

## 2011-11-22 NOTE — H&P (Signed)
Tracy Vasquez is an 78 y.o. female.   Chief Complaint: Abdominal pain HPI:  78 year old female presents with around 1 week of abdominal symptoms.  She had a "stomach ache" around 7 days ago.  This was accompanied by anorexia and mild nausea.  She denies fevers/ chills.  She has not had vomiting.  She stated that the pain gradually migrated to the right lower quadrant.  She did not want to come in to the hospital, because she never gets sick, but the pain today became unbearable.  She has not had intraabdominal surgery before.    Past Medical History  Diagnosis Date  . CVA (cerebral infarction)   . Atrial fibrillation     Past Surgical History  Procedure Date  . Pacemaker insertion     History reviewed. No pertinent family history. Social History: Pt quit smoking 40 years ago.   She does not have any smokeless tobacco history on file. She reports that she does not drink alcohol. Her drug history not on file.  Allergies: No Known Allergies  MedicationsLong-Term  Prescriptions Show Facility-Administered Medications    AMIODARONE HCL PO   cholecalciferol (VITAMIN D) 1000 UNITS tablet   co-enzyme Q-10 50 MG capsule   DIGOXIN PO   METOPROLOL TARTRATE PO   vitamin C (ASCORBIC ACID) 500 MG tablet   WARFARIN SODIUM PO     (Not in a hospital admission)  Results for orders placed during the hospital encounter of 11/21/11 (from the past 48 hour(s))  CBC     Status: Abnormal   Collection Time   11/21/11  8:07 PM      Component Value Range Comment   WBC 15.8 (*) 4.0 - 10.5 (K/uL)    RBC 3.96  3.87 - 5.11 (MIL/uL)    Hemoglobin 12.4  12.0 - 15.0 (g/dL)    HCT 37.6  36.0 - 46.0 (%)    MCV 94.9  78.0 - 100.0 (fL)    MCH 31.3  26.0 - 34.0 (pg)    MCHC 33.0  30.0 - 36.0 (g/dL)    RDW 12.9  11.5 - 15.5 (%)    Platelets 249  150 - 400 (K/uL)   DIFFERENTIAL     Status: Abnormal   Collection Time   11/21/11  8:07 PM      Component Value Range Comment   Neutrophils Relative 78 (*) 43 - 77  (%)    Neutro Abs 12.3 (*) 1.7 - 7.7 (K/uL)    Lymphocytes Relative 14  12 - 46 (%)    Lymphs Abs 2.1  0.7 - 4.0 (K/uL)    Monocytes Relative 8  3 - 12 (%)    Monocytes Absolute 1.2 (*) 0.1 - 1.0 (K/uL)    Eosinophils Relative 1  0 - 5 (%)    Eosinophils Absolute 0.1  0.0 - 0.7 (K/uL)    Basophils Relative 0  0 - 1 (%)    Basophils Absolute 0.1  0.0 - 0.1 (K/uL)   COMPREHENSIVE METABOLIC PANEL     Status: Abnormal   Collection Time   11/21/11  8:07 PM      Component Value Range Comment   Sodium 137  135 - 145 (mEq/L)    Potassium 4.1  3.5 - 5.1 (mEq/L)    Chloride 100  96 - 112 (mEq/L)    CO2 26  19 - 32 (mEq/L)    Glucose, Bld 101 (*) 70 - 99 (mg/dL)    BUN 15  6 -  23 (mg/dL)    Creatinine, Ser 1.15 (*) 0.50 - 1.10 (mg/dL)    Calcium 9.1  8.4 - 10.5 (mg/dL)    Total Protein 7.5  6.0 - 8.3 (g/dL)    Albumin 3.5  3.5 - 5.2 (g/dL)    AST 17  0 - 37 (U/L)    ALT 16  0 - 35 (U/L)    Alkaline Phosphatase 62  39 - 117 (U/L)    Total Bilirubin 0.6  0.3 - 1.2 (mg/dL)    GFR calc non Af Amer 44 (*) >90 (mL/min)    GFR calc Af Amer 51 (*) >90 (mL/min)   LIPASE, BLOOD     Status: Normal   Collection Time   11/21/11  8:07 PM      Component Value Range Comment   Lipase 23  11 - 59 (U/L)   PROTIME-INR     Status: Abnormal   Collection Time   11/21/11  8:07 PM      Component Value Range Comment   Prothrombin Time 21.1 (*) 11.6 - 15.2 (seconds)    INR 1.79 (*) 0.00 - 1.49    APTT     Status: Abnormal   Collection Time   11/21/11  8:07 PM      Component Value Range Comment   aPTT 38 (*) 24 - 37 (seconds)   DIGOXIN LEVEL     Status: Normal   Collection Time   11/21/11  8:07 PM      Component Value Range Comment   Digoxin Level 1.0  0.8 - 2.0 (ng/mL)   POCT I-STAT TROPONIN I     Status: Normal   Collection Time   11/21/11  8:18 PM      Component Value Range Comment   Troponin i, poc 0.00  0.00 - 0.08 (ng/mL)    Comment 3            URINALYSIS, ROUTINE W REFLEX MICROSCOPIC     Status:  Abnormal   Collection Time   11/21/11  9:14 PM      Component Value Range Comment   Color, Urine YELLOW  YELLOW     APPearance CLEAR  CLEAR     Specific Gravity, Urine 1.012  1.005 - 1.030     pH 6.0  5.0 - 8.0     Glucose, UA NEGATIVE  NEGATIVE (mg/dL)    Hgb urine dipstick NEGATIVE  NEGATIVE     Bilirubin Urine NEGATIVE  NEGATIVE     Ketones, ur 15 (*) NEGATIVE (mg/dL)    Protein, ur NEGATIVE  NEGATIVE (mg/dL)    Urobilinogen, UA 0.2  0.0 - 1.0 (mg/dL)    Nitrite NEGATIVE  NEGATIVE     Leukocytes, UA SMALL (*) NEGATIVE    URINE MICROSCOPIC-ADD ON     Status: Abnormal   Collection Time   11/21/11  9:14 PM      Component Value Range Comment   Squamous Epithelial / LPF FEW (*) RARE     WBC, UA 3-6  <3 (WBC/hpf)    RBC / HPF 0-2  <3 (RBC/hpf)    Bacteria, UA FEW (*) RARE    PREPARE FRESH FROZEN PLASMA     Status: Normal (Preliminary result)   Collection Time   11/21/11 11:00 PM      Component Value Range Comment   Unit Number [id number]      Blood Component Type THAWED PLASMA      Unit division 00  Status of Unit ALLOCATED      Transfusion Status OK TO TRANSFUSE      Unit Number [id number]      Blood Component Type THAWED PLASMA      Unit division 00      Status of Unit ALLOCATED      Transfusion Status OK TO TRANSFUSE     TYPE AND SCREEN     Status: Normal (Preliminary result)   Collection Time   11/21/11 11:13 PM      Component Value Range Comment   ABO/RH(D) O POS      Antibody Screen PENDING      Sample Expiration 11/24/2011      Ct Abdomen Pelvis W Contrast  11/21/2011  *RADIOLOGY REPORT*  Clinical Data: Right lower quadrant pain  CT ABDOMEN AND PELVIS WITH CONTRAST  Technique:  Multidetector CT imaging of the abdomen and pelvis was performed following the standard protocol during bolus administration of intravenous contrast.  Contrast: 48mL OMNIPAQUE IOHEXOL 300 MG/ML  SOLN  Comparison: None.  Findings: Minimal dependent scarring versus atelectasis.  Partially imaged  pacer wires within the right atrium and right ventricle. Mild cardiomegaly.  No pleural or pericardial effusion.  Unremarkable liver.  Layering high attenuation within the gallbladder may reflect stones or sludge.  Unremarkable spleen, pancreas, adrenal glands.  Lobular renal contours with bilateral too small further characterize hypodensities.  Mild pelvicaliectasis bilaterally without overt hydroureter.  Findings are suggestive of acute appendicitis with perforation. 3.2 x 2.2 cm debris/fluid collection within the right lower quadrant may reflect early abscess formation.  There are reactive ileocolic lymph nodes.  No lymphadenopathy.  There is scattered atherosclerotic calcification of the aorta and its branches. No aneurysmal dilatation.  Thin-walled bladder.  Fat containing right inguinal hernia. Nonspecific CT appearance to the uterus and adnexa. Endometrial cavity appears mildly prominent.  IMPRESSION: Acute appendicitis with perforation.  Extraluminal loculated collection within the right lower quadrant is most in keeping with early abscess formation.  Endometrial prominence, nonspecific on CT. Can be further evaluated with a non emergent pelvic ultrasound follow-up.  Discussed via telephone with Dr. Lita Mains at 10:20 p.m. on 11/21/2011.  Original Report Authenticated By: Suanne Marker, M.D.    Review of Systems  Constitutional: Negative.   HENT: Negative.   Eyes: Negative.   Respiratory: Negative.   Cardiovascular: Negative.        Has pacer  Gastrointestinal: Positive for nausea and abdominal pain. Negative for diarrhea and constipation.       Anorexia  Genitourinary: Negative.   Musculoskeletal: Negative.   Skin: Negative.   Neurological: Negative.   Endo/Heme/Allergies:       On coumadin   Psychiatric/Behavioral: Negative.     Blood pressure 109/46, pulse 76, temperature 99.1 F (37.3 C), temperature source Oral, resp. rate 20, SpO2 95.00%. Physical Exam  Constitutional: She  is oriented to person, place, and time. She appears well-developed and well-nourished. She appears distressed (looks uncomfortable).  HENT:  Head: Normocephalic and atraumatic.  Eyes: Pupils are equal, round, and reactive to light. No scleral icterus.       Conjunctiva slightly injected.  Neck: No tracheal deviation present. No thyromegaly present.  Cardiovascular: Normal rate, regular rhythm, normal heart sounds and intact distal pulses.  Exam reveals no gallop and no friction rub.   No murmur heard. Respiratory: Breath sounds normal. No respiratory distress. She has no wheezes. She has no rales. She exhibits no tenderness.  GI: Soft. She exhibits distension (moderate). She exhibits no  mass. There is tenderness (RLQ). There is guarding (voluntary guarding RLQ only). There is no rebound.  Musculoskeletal: Normal range of motion. She exhibits no edema and no tenderness.  Neurological: She is alert and oriented to person, place, and time. Coordination normal.  Skin: Skin is warm and dry. No rash noted. She is not diaphoretic. No erythema. No pallor.  Psychiatric: She has a normal mood and affect. Her behavior is normal. Judgment and thought content normal.     Assessment/Plan 1.  Acute appendicitis  IVF  IV antibiotics  NPO  To OR for lap/open appendectomy. Appendectomy was described to the patient.  The incisions and surgical technique were explained.  The patient was advised that some of the hair on the abdomen would be clipped, and that a foley catheter would be placed.  I advised the patient of the risks of surgery including, but not limited to, bleeding, infection, damage to other structures, risk of an open operation, risk of abscess, and risk of blood clot.  The recovery was also described to the patient.  She was advised that he will have lifting restrictions for 2 weeks.    2.  Anticoagulation  Transfuse 2 u FFP to get INR below 1.5.  3.  Afib - continue home medications.  Pacer  functioning   Posey Petrik 11/22/2011, 12:17 AM

## 2011-11-22 NOTE — Transfer of Care (Signed)
Immediate Anesthesia Transfer of Care Note  Patient: Tracy Vasquez  Procedure(s) Performed: Procedure(s) (LRB): APPENDECTOMY LAPAROSCOPIC (N/A)  Patient Location: PACU  Anesthesia Type: General  Level of Consciousness: sedated  Airway & Oxygen Therapy: Patient connected to nasal cannula oxygen  Post-op Assessment: Report given to PACU RN and Post -op Vital signs reviewed and stable  Post vital signs: Reviewed and stable  Complications: No apparent anesthesia complications

## 2011-11-22 NOTE — Anesthesia Preprocedure Evaluation (Addendum)
Anesthesia Evaluation  Patient identified by MRN, date of birth, ID band Patient awake    Reviewed: Allergy & Precautions, H&P , NPO status , Patient's Chart, lab work & pertinent test results  History of Anesthesia Complications Negative for: history of anesthetic complications  Airway Mallampati: II TM Distance: <3 FB Neck ROM: Full    Dental  (+) Teeth Intact and Dental Advisory Given   Pulmonary neg pulmonary ROS, former smoker breath sounds clear to auscultation        Cardiovascular + dysrhythmias + pacemaker Rhythm:Irregular Rate:Normal + Systolic murmurs    Neuro/Psych CVA, No Residual Symptoms    GI/Hepatic Patient received Oral Contrast Agents,  Endo/Other    Renal/GU      Musculoskeletal   Abdominal (+)  Abdomen: tender.    Peds  Hematology   Anesthesia Other Findings   Reproductive/Obstetrics                         Anesthesia Physical Anesthesia Plan  ASA: III and Emergent  Anesthesia Plan: General   Post-op Pain Management:    Induction: Intravenous, Rapid sequence and Cricoid pressure planned  Airway Management Planned: Oral ETT  Additional Equipment:   Intra-op Plan:   Post-operative Plan: Extubation in OR  Informed Consent: I have reviewed the patients History and Physical, chart, labs and discussed the procedure including the risks, benefits and alternatives for the proposed anesthesia with the patient or authorized representative who has indicated his/her understanding and acceptance.   Dental advisory given  Plan Discussed with: Surgeon and CRNA  Anesthesia Plan Comments:        Anesthesia Quick Evaluation

## 2011-11-23 ENCOUNTER — Encounter (HOSPITAL_COMMUNITY): Payer: Self-pay | Admitting: General Surgery

## 2011-11-23 LAB — PREPARE FRESH FROZEN PLASMA: Unit division: 0

## 2011-11-23 LAB — BASIC METABOLIC PANEL
Chloride: 101 mEq/L (ref 96–112)
GFR calc Af Amer: 66 mL/min — ABNORMAL LOW (ref >90)
Potassium: 4.1 mEq/L (ref 3.5–5.1)
Sodium: 134 mEq/L — ABNORMAL LOW (ref 135–145)

## 2011-11-23 LAB — CBC
HCT: 32.5 % — ABNORMAL LOW (ref 36.0–46.0)
Hemoglobin: 10.5 g/dL — ABNORMAL LOW (ref 12.0–15.0)
RBC: 3.36 MIL/uL — ABNORMAL LOW (ref 3.87–5.11)
RDW: 13.1 % (ref 11.5–15.5)
WBC: 14 10*3/uL — ABNORMAL HIGH (ref 4.0–10.5)

## 2011-11-23 LAB — PROTIME-INR: INR: 1.85 — ABNORMAL HIGH (ref 0.00–1.49)

## 2011-11-23 MED ORDER — ACETAMINOPHEN 10 MG/ML IV SOLN
1000.0000 mg | Freq: Once | INTRAVENOUS | Status: AC
  Administered 2011-11-23: 1000 mg via INTRAVENOUS

## 2011-11-23 NOTE — Progress Notes (Signed)
Utilization review completed.  

## 2011-11-23 NOTE — Progress Notes (Signed)
Patient ID: Tracy Vasquez, female   DOB: 02/28/1930, 78 y.o.   MRN: [id number] 1 Day Post-Op  Subjective: Pt c/o abdominal pain.  Had to take morphine which helped some.  No flatus yet.  Tolerating clear liquids without nausea.  Objective: Vital signs in last 24 hours: Temp:  [97.8 F (36.6 C)-99 F (37.2 C)] 98.1 F (36.7 C) (05/20 0636) Pulse Rate:  [60-78] 60  (05/20 0636) Resp:  [18-20] 20  (05/20 0636) BP: (91-123)/(38-64) 104/39 mmHg (05/20 0636) SpO2:  [95 %-100 %] 95 % (05/20 0636) Weight:  [137 lb 2 oz (62.2 kg)] 137 lb 2 oz (62.2 kg) (05/20 0636) Last BM Date: 11/21/11  Intake/Output from previous day: 05/19 0701 - 05/20 0700 In: 2538.8 [P.O.:940; I.V.:1248.8; IV Piggyback:350] Out: 1360 [Urine:950; Drains:410] Intake/Output this shift: Total I/O In: 0  Out: 30 [Drains:30]  PE: Abd: soft, distended, few BS, tender, JP with serosang Heart: regular Lungs: CTAB  Lab Results:   Basename 11/23/11 0500 11/22/11 0921  WBC 14.0* 12.2*  HGB 10.5* 10.6*  HCT 32.5* 32.9*  PLT 223 205   BMET  Basename 11/23/11 0500 11/22/11 0921 11/21/11 2007  NA 134* -- 137  K 4.1 -- 4.1  CL 101 -- 100  CO2 24 -- 26  GLUCOSE 116* -- 101*  BUN 10 -- 15  CREATININE 0.92 0.93 --  CALCIUM 7.6* -- 9.1   PT/INR  Basename 11/23/11 0500 11/21/11 2007  LABPROT 21.7* 21.1*  INR 1.85* 1.79*   CMP     Component Value Date/Time   NA 134* 11/23/2011 0500   K 4.1 11/23/2011 0500   CL 101 11/23/2011 0500   CO2 24 11/23/2011 0500   GLUCOSE 116* 11/23/2011 0500   BUN 10 11/23/2011 0500   CREATININE 0.92 11/23/2011 0500   CALCIUM 7.6* 11/23/2011 0500   PROT 7.5 11/21/2011 2007   ALBUMIN 3.5 11/21/2011 2007   AST 17 11/21/2011 2007   ALT 16 11/21/2011 2007   ALKPHOS 62 11/21/2011 2007   BILITOT 0.6 11/21/2011 2007   GFRNONAA 57* 11/23/2011 0500   GFRAA 66* 11/23/2011 0500   Lipase     Component Value Date/Time   LIPASE 23 11/21/2011 2007       Studies/Results: Ct Abdomen Pelvis W  Contrast  11/21/2011  *RADIOLOGY REPORT*  Clinical Data: Right lower quadrant pain  CT ABDOMEN AND PELVIS WITH CONTRAST  Technique:  Multidetector CT imaging of the abdomen and pelvis was performed following the standard protocol during bolus administration of intravenous contrast.  Contrast: 41mL OMNIPAQUE IOHEXOL 300 MG/ML  SOLN  Comparison: None.  Findings: Minimal dependent scarring versus atelectasis.  Partially imaged pacer wires within the right atrium and right ventricle. Mild cardiomegaly.  No pleural or pericardial effusion.  Unremarkable liver.  Layering high attenuation within the gallbladder may reflect stones or sludge.  Unremarkable spleen, pancreas, adrenal glands.  Lobular renal contours with bilateral too small further characterize hypodensities.  Mild pelvicaliectasis bilaterally without overt hydroureter.  Findings are suggestive of acute appendicitis with perforation. 3.2 x 2.2 cm debris/fluid collection within the right lower quadrant may reflect early abscess formation.  There are reactive ileocolic lymph nodes.  No lymphadenopathy.  There is scattered atherosclerotic calcification of the aorta and its branches. No aneurysmal dilatation.  Thin-walled bladder.  Fat containing right inguinal hernia. Nonspecific CT appearance to the uterus and adnexa. Endometrial cavity appears mildly prominent.  IMPRESSION: Acute appendicitis with perforation.  Extraluminal loculated collection within the right lower quadrant is most in  keeping with early abscess formation.  Endometrial prominence, nonspecific on CT. Can be further evaluated with a non emergent pelvic ultrasound follow-up.  Discussed via telephone with Dr. Lita Mains at 10:20 p.m. on 11/21/2011.  Original Report Authenticated By: Suanne Marker, M.D.    Anti-infectives: Anti-infectives     Start     Dose/Rate Route Frequency Ordered Stop   11/22/11 2200   ertapenem (INVANZ) 1 g in sodium chloride 0.9 % 50 mL IVPB        1 g 100  mL/hr over 30 Minutes Intravenous Every 24 hours 11/22/11 0650     11/22/11 0700   cefOXitin (MEFOXIN) 1 g in dextrose 5 % 50 mL IVPB  Status:  Discontinued        1 g 100 mL/hr over 30 Minutes Intravenous  Once 11/22/11 0650 11/22/11 0654   11/21/11 2245   ertapenem (INVANZ) 1 g in sodium chloride 0.9 % 50 mL IVPB        1 g 100 mL/hr over 30 Minutes Intra[facility]ous [facility] 11/21/11 2236 11/21/11 2337           Assessment/Plan  1. S/p lap appy for perf appy 2. A fib, currently in NSR  Plan:  1.  Cont on clear liquids for now as patient has not had flatus yet and still has some distention. 2. Cont IV abx therapy.  Will recheck CBC in the morning as her WBC trended back up some today. 3. Cont to mobilize and pulm toilet.  LOS: 2 days    Najmo Pardue E 11/23/2011

## 2011-11-24 LAB — CBC
HCT: 33.4 % — ABNORMAL LOW (ref 36.0–46.0)
Hemoglobin: 10.7 g/dL — ABNORMAL LOW (ref 12.0–15.0)
RBC: 3.46 MIL/uL — ABNORMAL LOW (ref 3.87–5.11)
RDW: 13.1 % (ref 11.5–15.5)
WBC: 14.7 10*3/uL — ABNORMAL HIGH (ref 4.0–10.5)

## 2011-11-24 MED ORDER — WARFARIN - PHARMACIST DOSING INPATIENT
Freq: Every day | Status: DC
  Administered 2011-11-24 – 2011-11-26 (×2)

## 2011-11-24 MED ORDER — WARFARIN 0.5 MG HALF TABLET
1.5000 mg | ORAL_TABLET | Freq: Once | ORAL | Status: AC
  Administered 2011-11-24: 1.5 mg via ORAL
  Filled 2011-11-24: qty 1

## 2011-11-24 NOTE — Progress Notes (Signed)
ANTICOAGULATION CONSULT NOTE - Initial Consult  Pharmacy Consult for Coumadin Indication: afib/stroke  No Known Allergies  Patient Measurements: Weight: 122 lb 12.7 oz (55.7 kg) (a scale)  Vital Signs: Temp: 99.3 F (37.4 C) (05/21 0500) Temp src: Oral (05/21 0500) BP: 131/46 mmHg (05/21 1015) Pulse Rate: 74  (05/21 1015)  Labs:  Basename 11/24/11 0455 11/23/11 0500 11/22/11 0921 11/21/11 2007  HGB 10.7* 10.5* -- --  HCT 33.4* 32.5* 32.9* --  PLT 256 223 205 --  APTT -- -- -- 38*  LABPROT -- 21.7* -- 21.1*  INR -- 1.85* -- 1.79*  HEPARINUNFRC -- -- -- --  CREATININE -- 0.92 0.93 1.15*  CKTOTAL -- -- -- --  CKMB -- -- -- --  TROPONINI -- -- -- --    CrCl is unknown because there is no height on file for the current visit.   Medical History: Past Medical History  Diagnosis Date  . CVA (cerebral infarction)   . Atrial fibrillation   . Pacemaker     Medications:  Prescriptions prior to admission  Medication Sig Dispense Refill  . amiodarone (PACERONE) 200 MG tablet Take 200 mg by mouth daily.      . cholecalciferol (VITAMIN D) 1000 UNITS tablet Take 1,000 Units by mouth daily.      Marland Kitchen co-enzyme Q-10 50 MG capsule Take 50 mg by mouth daily.      . digoxin (LANOXIN) 0.125 MG tablet Take 125 mcg by mouth daily.      . metoprolol succinate (TOPROL-XL) 25 MG 24 hr tablet Take 25 mg by mouth 2 (two) times daily.      . vitamin C (ASCORBIC ACID) 500 MG tablet Take 500 mg by mouth daily.      Marland Kitchen warfarin (COUMADIN) 1 MG tablet Take 1 mg by mouth daily.        Assessment: 78 y/o female patient s/p appendectomy for perforated appendix on chronic coumadin, has been on hold since admission. Resume today, receiving lovenox 40mg  daily.  Goal of Therapy:  INR 2-3 Monitor platelets by anticoagulation protocol: Yes   Plan:  Coumadin 1.5mg  today and f/u daily protime.  Davonna Belling, PharmD, BCPS Pager [phone] 11/24/2011,11:26 AM

## 2011-11-24 NOTE — Progress Notes (Signed)
Patient ID: Tracy Vasquez, female   DOB: [birth year], 78 y.o.   MRN: [id number] 2 Days Post-Op  Subjective: Pt feels much better today.  She is passing flatus.  She is tolerating her clear liquids well without any complaints.  Objective: Vital signs in last 24 hours: Temp:  [98.9 F (37.2 C)-99.3 F (37.4 C)] 99.3 F (37.4 C) (05/21 0500) Pulse Rate:  [71-74] 74  (05/21 1015) Resp:  [18-19] 18  (05/21 0500) BP: (91-131)/(42-65) 131/46 mmHg (05/21 1015) SpO2:  [91 %-93 %] 93 % (05/21 0500) Weight:  [122 lb 12.7 oz (55.7 kg)] 122 lb 12.7 oz (55.7 kg) (05/21 0500) Last BM Date: 11/21/11  Intake/Output from previous day: 05/20 0701 - 05/21 0700 In: 2116.3 [P.O.:490; I.V.:1526.3; IV Piggyback:100] Out: 1316 [Urine:1201; Drains:115] Intake/Output this shift: Total I/O In: 120 [P.O.:120] Out: 250 [Urine:250]  PE: Abd: softer, less distended, incisions c/d/i, JP with serosang output.  Lab Results:   Basename 11/24/11 0455 11/23/11 0500  WBC 14.7* 14.0*  HGB 10.7* 10.5*  HCT 33.4* 32.5*  PLT 256 223   BMET  Basename 11/23/11 0500 11/22/11 0921 11/21/11 2007  NA 134* -- 137  K 4.1 -- 4.1  CL 101 -- 100  CO2 24 -- 26  GLUCOSE 116* -- 101*  BUN 10 -- 15  CREATININE 0.92 0.93 --  CALCIUM 7.6* -- 9.1   PT/INR  Basename 11/23/11 0500 11/21/11 2007  LABPROT 21.7* 21.1*  INR 1.85* 1.79*   CMP     Component Value Date/Time   NA 134* 11/23/2011 0500   K 4.1 11/23/2011 0500   CL 101 11/23/2011 0500   CO2 24 11/23/2011 0500   GLUCOSE 116* 11/23/2011 0500   BUN 10 11/23/2011 0500   CREATININE 0.92 11/23/2011 0500   CALCIUM 7.6* 11/23/2011 0500   PROT 7.5 11/21/2011 2007   ALBUMIN 3.5 11/21/2011 2007   AST 17 11/21/2011 2007   ALT 16 11/21/2011 2007   ALKPHOS 62 11/21/2011 2007   BILITOT 0.6 11/21/2011 2007   GFRNONAA 57* 11/23/2011 0500   GFRAA 66* 11/23/2011 0500   Lipase     Component Value Date/Time   LIPASE 23 11/21/2011 2007       Studies/Results: No results  found.  Anti-infectives: Anti-infectives     Start     Dose/Rate Route Frequency Ordered Stop   11/22/11 2200   ertapenem (INVANZ) 1 g in sodium chloride 0.9 % 50 mL IVPB        1 g 100 mL/hr over 30 Minutes Intravenous Every 24 hours 11/22/11 0650     11/22/11 0700   cefOXitin (MEFOXIN) 1 g in dextrose 5 % 50 mL IVPB  Status:  Discontinued        1 g 100 mL/hr over 30 Minutes Intravenous  Once 11/22/11 0650 11/22/11 0654   11/21/11 2245   ertapenem (INVANZ) 1 g in sodium chloride 0.9 % 50 mL IVPB        1 g 100 mL/hr over 30 Minutes Intra[facility]ous [facility] 11/21/11 2236 11/21/11 2337           Assessment/Plan  1. S/p lap appy for perf appy 2. A fib  Plan: 1. Advance to full liquid diet 2. Check daily PT/INR, will restart coumadin per pharmacy today 3. Check cbc in am to follow WBC. 4. Cont IV abx therapy   LOS: 3 days    Chanel Mckesson E 11/24/2011

## 2011-11-25 LAB — CBC
HCT: 33.4 % — ABNORMAL LOW (ref 36.0–46.0)
Hemoglobin: 10.7 g/dL — ABNORMAL LOW (ref 12.0–15.0)
MCH: 30.6 pg (ref 26.0–34.0)
RBC: 3.5 MIL/uL — ABNORMAL LOW (ref 3.87–5.11)

## 2011-11-25 LAB — PROTIME-INR: Prothrombin Time: 21.2 seconds — ABNORMAL HIGH (ref 11.6–15.2)

## 2011-11-25 MED ORDER — WARFARIN SODIUM 1 MG PO TABS
1.5000 mg | ORAL_TABLET | Freq: Once | ORAL | Status: AC
  Administered 2011-11-25: 1.5 mg via ORAL
  Filled 2011-11-25: qty 1

## 2011-11-25 NOTE — Progress Notes (Signed)
Patient ID: Tracy Vasquez, female   DOB: 11-17-1929, 78 y.o.   MRN: [id number] 3 Days Post-Op  Subjective: Pt feeling much better.  Tolerating full liquids.  Had BM yesterday.  Objective: Vital signs in last 24 hours: Temp:  [98.5 F (36.9 C)-99.7 F (37.6 C)] 99.7 F (37.6 C) (05/22 0527) Pulse Rate:  [73-89] 74  (05/22 0527) Resp:  [18-20] 18  (05/22 0527) BP: (116-144)/(41-73) 144/73 mmHg (05/22 0527) SpO2:  [96 %-97 %] 96 % (05/22 0527) Weight:  [131 lb 6.4 oz (59.603 kg)] 131 lb 6.4 oz (59.603 kg) (05/22 0527) Last BM Date: 11/24/11  Intake/Output from previous day: 05/21 0701 - 05/22 0700 In: 1851.3 [P.O.:360; I.V.:1491.3] Out: 2038 [Urine:2000; Drains:38] Intake/Output this shift: Total I/O In: 120 [P.O.:120] Out: -   PE: Abd: soft, less tender, JP with serous output, this was pulled per Dr. Hassell Done.  +BS, ND, incisions c/d/i Heart: regular  Lab Results:   Basename 11/25/11 0640 11/24/11 0455  WBC 13.7* 14.7*  HGB 10.7* 10.7*  HCT 33.4* 33.4*  PLT 288 256   BMET  Basename 11/23/11 0500  NA 134*  K 4.1  CL 101  CO2 24  GLUCOSE 116*  BUN 10  CREATININE 0.92  CALCIUM 7.6*   PT/INR  Basename 11/25/11 0640 11/23/11 0500  LABPROT 21.2* 21.7*  INR 1.80* 1.85*   CMP     Component Value Date/Time   NA 134* 11/23/2011 0500   K 4.1 11/23/2011 0500   CL 101 11/23/2011 0500   CO2 24 11/23/2011 0500   GLUCOSE 116* 11/23/2011 0500   BUN 10 11/23/2011 0500   CREATININE 0.92 11/23/2011 0500   CALCIUM 7.6* 11/23/2011 0500   PROT 7.5 11/21/2011 2007   ALBUMIN 3.5 11/21/2011 2007   AST 17 11/21/2011 2007   ALT 16 11/21/2011 2007   ALKPHOS 62 11/21/2011 2007   BILITOT 0.6 11/21/2011 2007   GFRNONAA 57* 11/23/2011 0500   GFRAA 66* 11/23/2011 0500   Lipase     Component Value Date/Time   LIPASE 23 11/21/2011 2007       Studies/Results: No results found.  Anti-infectives: Anti-infectives     Start     Dose/Rate Route Frequency Ordered Stop   11/22/11 2200    ertapenem (INVANZ) 1 g in sodium chloride 0.9 % 50 mL IVPB        1 g 100 mL/hr over 30 Minutes Intravenous Every 24 hours 11/22/11 0650     11/22/11 0700   cefOXitin (MEFOXIN) 1 g in dextrose 5 % 50 mL IVPB  Status:  Discontinued        1 g 100 mL/hr over 30 Minutes Intravenous  Once 11/22/11 0650 11/22/11 0654   11/21/11 2245   ertapenem (INVANZ) 1 g in sodium chloride 0.9 % 50 mL IVPB        1 g 100 mL/hr over 30 Minutes Intra[facility]ous [facility] 11/21/11 2236 11/21/11 2337           Assessment/Plan  1. S/p lap appy for perf appy 2. A fib  Plan: 1. Advance diet 2. Dc drain prior to being therapeutic on coumadin per Dr. Earlie Server request.  Drain removed without any difficulties. 3. Hopefully home in 1-2 days.   LOS: 4 days    Dany Harten E 11/25/2011

## 2011-11-25 NOTE — Progress Notes (Signed)
ANTICOAGULATION CONSULT NOTE - Follow Up Consult  Pharmacy Consult for Coumadin Indication: Afib, hx stroke  No Known Allergies  Patient Measurements: Weight: 131 lb 6.4 oz (59.603 kg) (a scale; reweighed pt twice)  Vital Signs: Temp: 99.7 F (37.6 C) (05/22 0527) Temp src: Oral (05/22 0527) BP: 144/73 mmHg (05/22 0527) Pulse Rate: 74  (05/22 0527)  Labs:  Basename 11/25/11 0640 11/24/11 0455 11/23/11 0500 11/22/11 0921  HGB 10.7* 10.7* -- --  HCT 33.4* 33.4* 32.5* --  PLT 288 256 223 --  APTT -- -- -- --  LABPROT 21.2* -- 21.7* --  INR 1.80* -- 1.85* --  HEPARINUNFRC -- -- -- --  CREATININE -- -- 0.92 0.93  CKTOTAL -- -- -- --  CKMB -- -- -- --  TROPONINI -- -- -- --    CrCl is unknown because there is no height on file for the current visit.   Medications:  Scheduled:    . amiodarone  100 mg Oral Daily  . digoxin  0.125 mg Oral Daily  . enoxaparin  40 mg Subcutaneous Q24H  . ertapenem (INVANZ) IV  1 g Intravenous Q24H  . metoprolol tartrate  25 mg Oral BID  . warfarin  1.5 mg Oral ONCE-1800  . Warfarin - Pharmacist Dosing Inpatient   Does not apply q1800    Assessment: 78 yo female s/p appendectomy for perforated appendix on chronic Coumadin for Afib and hx stroke. INR is subtherapeutic. On Lovenox 40 mg/day until INR therapeutic. No bleeding noted and CBC is stable.  Home Coumadin dose: 1 mg daily  Goal of Therapy:  INR 2-3 Monitor platelets by anticoagulation protocol: Yes   Plan:  Coumadin 1.5 mg po tonight INR daily  [facility], [place].D., BCPS Clinical Pharmacist 11/25/2011 8:37 AM

## 2011-11-26 LAB — PROTIME-INR
INR: 1.88 — ABNORMAL HIGH (ref 0.00–1.49)
Prothrombin Time: 21.9 seconds — ABNORMAL HIGH (ref 11.6–15.2)

## 2011-11-26 MED ORDER — WARFARIN SODIUM 2 MG PO TABS
2.0000 mg | ORAL_TABLET | Freq: Once | ORAL | Status: AC
  Administered 2011-11-26: 2 mg via ORAL
  Filled 2011-11-26: qty 1

## 2011-11-26 NOTE — Consult Note (Signed)
ANTICOAGULATION CONSULT NOTE - Follow Up Consult  Pharmacy Consult for Coumadin Indication: atrial fibrillation and stroke  No Known Allergies  Patient Measurements: Height: 5\' 1"  (154.9 cm) Weight: 129 lb 3.2 oz (58.605 kg) (a scale) IBW/kg (Calculated) : 47.8    Vital Signs: Temp: 98.2 F (36.8 C) (05/23 1003) Temp src: Oral (05/23 1003) BP: 109/54 mmHg (05/23 1003) Pulse Rate: 68  (05/23 1003)  Labs:  Basename 11/26/11 0525 11/25/11 0640 11/24/11 0455  HGB -- 10.7* 10.7*  HCT -- 33.4* 33.4*  PLT -- 288 256  APTT -- -- --  LABPROT 21.9* 21.2* --  INR 1.88* 1.80* --  HEPARINUNFRC -- -- --  CREATININE -- -- --  CKTOTAL -- -- --  CKMB -- -- --  TROPONINI -- -- --   Estimated Creatinine Clearance: 40.1 ml/min (by C-G formula based on Cr of 0.92).   Medications:  Scheduled:    . amiodarone  100 mg Oral Daily  . digoxin  0.125 mg Oral Daily  . enoxaparin  40 mg Subcutaneous Q24H  . ertapenem (INVANZ) IV  1 g Intravenous Q24H  . metoprolol tartrate  25 mg Oral BID  . warfarin  1.5 mg Oral ONCE-1800  . Warfarin - Pharmacist Dosing Inpatient   Does not apply q1800   Anti-infectives     Start     Dose/Rate Route Frequency Ordered Stop   11/22/11 2200   ertapenem (INVANZ) 1 g in sodium chloride 0.9 % 50 mL IVPB        1 g 100 mL/hr over 30 Minutes Intravenous Every 24 hours 11/22/11 0650     11/22/11 0700   cefOXitin (MEFOXIN) 1 g in dextrose 5 % 50 mL IVPB  Status:  Discontinued        1 g 100 mL/hr over 30 Minutes Intravenous  Once 11/22/11 0650 11/22/11 0654   11/21/11 2245   ertapenem (INVANZ) 1 g in sodium chloride 0.9 % 50 mL IVPB        1 g 100 mL/hr over 30 Minutes Intra[facility]ous [facility] 11/21/11 2236 11/21/11 2337          Assessment: 4 Days Post-Op appendectomy for perforated appendix. Patient on chronic Coumadin therapy for A-fib and history of stroke. INR SUBtherapeutic 1.88.  Patient remains on Lovenox bridging until INR  therapeutic.  Goal of Therapy:   INR 2-3   Plan:   Continue Lovenox 40 mg sq daily.  Coumadin 2 mg po today. Daily INR's, CBC.  Leianne Callins, Craig Guess, Pharm.D. 11/26/2011 2:26 PM

## 2011-11-26 NOTE — Progress Notes (Signed)
Patient ID: Tracy Vasquez, female   DOB: [birth year], 78 y.o.   MRN: [id number] [facility] Surgery Progress Note:   4 Days Post-Op  Subjective: Mental status is clear. Patient is feeling better but still weak. Objective: Vital signs in last 24 hours: Temp:  [97.6 F (36.4 C)-98 F (36.7 C)] 98 F (36.7 C) (05/23 0459) Pulse Rate:  [70-74] 71  (05/23 0459) Resp:  [18] 18  (05/23 0459) BP: (114-149)/(52-64) 149/64 mmHg (05/23 0459) SpO2:  [95 %-97 %] 97 % (05/23 0459) Weight:  [129 lb 3.2 oz (58.605 kg)] 129 lb 3.2 oz (58.605 kg) (05/23 0459)  Intake/Output from previous day: 05/22 0701 - 05/23 0700 In: 770 [P.O.:720; IV Piggyback:50] Out: 900 [Urine:900] Intake/Output this shift: Total I/O In: 480 [P.O.:480] Out: -   Physical Exam: Work of breathing is  nonlabored. Abdomen less tender. JP drain out  Lab Results:  Results for orders placed during the hospital encounter of 11/21/11 (from the past 48 hour(s))  CBC     Status: Abnormal   Collection Time   11/25/11  6:40 AM      Component Value Range Comment   WBC 13.7 (*) 4.0 - 10.5 (K/uL)    RBC 3.50 (*) 3.87 - 5.11 (MIL/uL)    Hemoglobin 10.7 (*) 12.0 - 15.0 (g/dL)    HCT 33.4 (*) 36.0 - 46.0 (%)    MCV 95.4  78.0 - 100.0 (fL)    MCH 30.6  26.0 - 34.0 (pg)    MCHC 32.0  30.0 - 36.0 (g/dL)    RDW 13.0  11.5 - 15.5 (%)    Platelets 288  150 - 400 (K/uL)   PROTIME-INR     Status: Abnormal   Collection Time   11/25/11  6:40 AM      Component Value Range Comment   Prothrombin Time 21.2 (*) 11.6 - 15.2 (seconds)    INR 1.80 (*) 0.00 - 1.49    PROTIME-INR     Status: Abnormal   Collection Time   11/26/11  5:25 AM      Component Value Range Comment   Prothrombin Time 21.9 (*) 11.6 - 15.2 (seconds)    INR 1.88 (*) 0.00 - 1.49      Radiology/Results: No results found.  Anti-infectives: Anti-infectives     Start     Dose/Rate Route Frequency Ordered Stop   11/22/11 2200   ertapenem (INVANZ) 1 g in sodium chloride 0.9 %  50 mL IVPB        1 g 100 mL/hr over 30 Minutes Intravenous Every 24 hours 11/22/11 0650     11/22/11 0700   cefOXitin (MEFOXIN) 1 g in dextrose 5 % 50 mL IVPB  Status:  Discontinued        1 g 100 mL/hr over 30 Minutes Intravenous  Once 11/22/11 0650 11/22/11 0654   11/21/11 2245   ertapenem (INVANZ) 1 g in sodium chloride 0.9 % 50 mL IVPB        1 g 100 mL/hr over 30 Minutes Intra[facility]ous [facility] 11/21/11 2236 11/21/11 2337          Assessment/Plan: Problem List: Patient Active Problem List  Diagnoses  . Acute gangrenous appendicitis with perforation and peritonitis  . Atrial fibrillation    Will consider discharge tomorrow pending INR and will recheck CBC. 4 Days Post-Op    LOS: 5 days   Matt B. Hassell Done, MD, [facility] [phone] beeper [phone]  11/26/2011  9:54 AM

## 2011-11-27 LAB — CBC
HCT: 35.4 % — ABNORMAL LOW (ref 36.0–46.0)
MCH: 30.4 pg (ref 26.0–34.0)
MCV: 94.4 fL (ref 78.0–100.0)
Platelets: 347 10*3/uL (ref 150–400)
RBC: 3.75 MIL/uL — ABNORMAL LOW (ref 3.87–5.11)
WBC: 10.1 10*3/uL (ref 4.0–10.5)

## 2011-11-27 MED ORDER — SODIUM CHLORIDE 0.9 % IJ SOLN
3.0000 mL | Freq: Two times a day (BID) | INTRAMUSCULAR | Status: DC
  Administered 2011-11-27: 3 mL via INTRAVENOUS

## 2011-11-27 MED ORDER — HYDROCODONE-ACETAMINOPHEN 5-325 MG PO TABS: 1.0000 | ORAL_TABLET | ORAL | Status: AC | PRN

## 2011-11-27 MED ORDER — WARFARIN SODIUM 1 MG PO TABS
1.5000 mg | ORAL_TABLET | Freq: Once | ORAL | Status: DC
  Filled 2011-11-27: qty 1

## 2011-11-27 MED ORDER — AMOXICILLIN-POT CLAVULANATE 875-125 MG PO TABS: 1.0000 | ORAL_TABLET | Freq: Two times a day (BID) | ORAL | Status: AC

## 2011-11-27 NOTE — Discharge Summary (Signed)
  Patient ID: Tracy Vasquez MRN: [id number] DOB/AGE: 76-Feb-1931 76 y.o.  Admit date: 11/21/2011 Discharge date: 11/27/2011  Procedures: lap appy  Consults: None  Reason for Admission: this is a 78 yo female who presented to the [facility] with c/o RLQ abdominal pain.  She was found to have acute appendicitis.  Admission Diagnoses:  1. Acute perforated appendicitis  Hospital Course: The patient was admitted and taken to the operating room where she underwent a laparoscopic appendectomy.  She was found to have a gangrenous ruptured appendix.  She had a JP drain left in place.  Postoperatively, she developed an ileus for a couple of days.  She finally started passing flatus and her diet was able to be advanced as tolerated.  She was ultimately restarted on her coumadin.  At discharge, her INR was 2.3.  Her JP drain remained serous and was discontinued prior to discharge.  Discharge Diagnoses:  Principal Problem:  *Acute gangrenous appendicitis with perforation and peritonitis Active Problems:  Atrial fibrillation s/p lap appy  Discharge Medications: Medication List  As of 11/27/2011 12:15 PM   TAKE these medications         amiodarone 200 MG tablet   Commonly known as: PACERONE   Take 200 mg by mouth daily.      amoxicillin-clavulanate 875-125 MG per tablet   Commonly known as: AUGMENTIN   Take 1 tablet by mouth 2 (two) times daily.      cholecalciferol 1000 UNITS tablet   Commonly known as: VITAMIN D   Take 1,000 Units by mouth daily.      co-enzyme Q-10 50 MG capsule   Take 50 mg by mouth daily.      digoxin 0.125 MG tablet   Commonly known as: LANOXIN   Take 125 mcg by mouth daily.      HYDROcodone-acetaminophen 5-325 MG per tablet   Commonly known as: NORCO   Take 1-2 tablets by mouth every 4 (four) hours as needed.      metoprolol succinate 25 MG 24 hr tablet   Commonly known as: TOPROL-XL   Take 25 mg by mouth 2 (two) times daily.      vitamin C 500 MG tablet   Commonly known as: ASCORBIC ACID   Take 500 mg by mouth daily.      warfarin 1 MG tablet   Commonly known as: COUMADIN   Take 1 mg by mouth daily.            Discharge Instructions: Follow-up Information    Follow up with cardiologist . (next week for INR check)        Dr. Stark Klein in 2 weeks [phone]  Signed: Saverio Danker E 11/27/2011, 12:15 PM

## 2011-11-27 NOTE — Progress Notes (Signed)
ANTICOAGULATION CONSULT NOTE - Follow Up Consult  Pharmacy Consult for Coumadin Indication: Afib, hx stroke  No Known Allergies  Patient Measurements: Height: 5\' 1"  (154.9 cm) Weight: 127 lb 3.3 oz (57.7 kg) IBW/kg (Calculated) : 47.8   Vital Signs: Temp: 97.7 F (36.5 C) (05/24 0940) Temp src: Oral (05/24 0940) BP: 111/45 mmHg (05/24 0940) Pulse Rate: 77  (05/24 0940)  Labs:  Basename 11/27/11 0614 11/26/11 0525 11/25/11 0640  HGB 11.4* -- 10.7*  HCT 35.4* -- 33.4*  PLT 347 -- 288  APTT -- -- --  LABPROT 25.8* 21.9* 21.2*  INR 2.31* 1.88* 1.80*  HEPARINUNFRC -- -- --  CREATININE -- -- --  CKTOTAL -- -- --  CKMB -- -- --  TROPONINI -- -- --    Estimated Creatinine Clearance: 39.9 ml/min (by C-G formula based on Cr of 0.92).   Medications:  Scheduled:     . amiodarone  100 mg Oral Daily  . digoxin  0.125 mg Oral Daily  . enoxaparin  40 mg Subcutaneous Q24H  . ertapenem (INVANZ) IV  1 g Intravenous Q24H  . metoprolol tartrate  25 mg Oral BID  . sodium chloride  3 mL Intravenous Q12H  . warfarin  2 mg Oral ONCE-1800  . Warfarin - Pharmacist Dosing Inpatient   Does not apply q1800    Assessment: 78 yo female s/p appendectomy for perforated appendix on chronic Coumadin for Afib and hx stroke. INR is therapeutic. On Lovenox 40 mg/day until INR therapeutic. No bleeding noted and CBC is stable.  Home Coumadin dose: 1 mg daily- but sub-therapeutic on admission.  Coumadin education completed this admission.   Goal of Therapy:  INR 2-3 Monitor platelets by anticoagulation protocol: Yes (347 this am)  Plan:  Coumadin 1.5 mg po tonight Recommend going home on 1.5mg  po daily of Coumadin 2/2 to INR being sub-therapeutic on admission with dose of 1mg  daily.  Recommend follow-up INR in 1-2 weeks if discharges home.  INR daily  Sloan Leiter, PharmD, BCPS Clinical Pharmacist [phone] 11/27/2011 10:23 AM

## 2011-11-27 NOTE — Progress Notes (Signed)
Patient has been discharged and has left unit________________________________________D. Owens Shark RN

## 2011-11-27 NOTE — Progress Notes (Signed)
Patient's tele and IV has been discontinued for discharge; patient verbalizes understanding of discharge instructions___________________________________________________________________________________D. Owens Shark RN

## 2011-11-27 NOTE — Progress Notes (Signed)
Patient ID: Tracy Vasquez, female   DOB: 06-08-1930, 78 y.o.   MRN: [id number] 5 Days Post-Op  Subjective: Pt without complaints.  Eating well.  Pain tolerated  Objective: Vital signs in last 24 hours: Temp:  [97.7 F (36.5 C)-98.6 F (37 C)] 97.7 F (36.5 C) (05/24 0940) Pulse Rate:  [70-77] 77  (05/24 0940) Resp:  [18-20] 18  (05/24 0940) BP: (111-140)/(45-59) 111/45 mmHg (05/24 0940) SpO2:  [93 %-98 %] 96 % (05/24 0940) Weight:  [127 lb 3.3 oz (57.7 kg)] 127 lb 3.3 oz (57.7 kg) (05/24 0455) Last BM Date: 11/25/11  Intake/Output from previous day: 05/23 0701 - 05/24 0700 In: 1650 [P.O.:1600; IV Piggyback:50] Out: 750 [Urine:750] Intake/Output this shift: Total I/O In: 243 [P.O.:240; I.V.:3] Out: -   PE: Abd: soft, appropriately tender, +BS, ND, incisions c/d/i  Lab Results:   Basename 11/27/11 0614 11/25/11 0640  WBC 10.1 13.7*  HGB 11.4* 10.7*  HCT 35.4* 33.4*  PLT 347 288   BMET No results found for this basename: NA:2,K:2,CL:2,CO2:2,GLUCOSE:2,BUN:2,CREATININE:2,CALCIUM:2 in the last 72 hours PT/INR  Basename 11/27/11 0614 11/26/11 0525  LABPROT 25.8* 21.9*  INR 2.31* 1.88*   CMP     Component Value Date/Time   NA 134* 11/23/2011 0500   K 4.1 11/23/2011 0500   CL 101 11/23/2011 0500   CO2 24 11/23/2011 0500   GLUCOSE 116* 11/23/2011 0500   BUN 10 11/23/2011 0500   CREATININE 0.92 11/23/2011 0500   CALCIUM 7.6* 11/23/2011 0500   PROT 7.5 11/21/2011 2007   ALBUMIN 3.5 11/21/2011 2007   AST 17 11/21/2011 2007   ALT 16 11/21/2011 2007   ALKPHOS 62 11/21/2011 2007   BILITOT 0.6 11/21/2011 2007   GFRNONAA 57* 11/23/2011 0500   GFRAA 66* 11/23/2011 0500   Lipase     Component Value Date/Time   LIPASE 23 11/21/2011 2007       Studies/Results: No results found.  Anti-infectives: Anti-infectives     Start     Dose/Rate Route Frequency Ordered Stop   11/22/11 2200   ertapenem (INVANZ) 1 g in sodium chloride 0.9 % 50 mL IVPB        1 g 100 mL/hr over 30  Minutes Intravenous Every 24 hours 11/22/11 0650     11/22/11 0700   cefOXitin (MEFOXIN) 1 g in dextrose 5 % 50 mL IVPB  Status:  Discontinued        1 g 100 mL/hr over 30 Minutes Intravenous  Once 11/22/11 0650 11/22/11 0654   11/21/11 2245   ertapenem (INVANZ) 1 g in sodium chloride 0.9 % 50 mL IVPB        1 g 100 mL/hr over 30 Minutes Intra[facility]ous [facility] 11/21/11 2236 11/21/11 2337           Assessment/Plan  1. S/p lap appy for perf appy  Plan: 1. Ok to [employer] home. 2. Coumadin therapeutic. Will have her follow up next week for INR check given she will be ok abx therapy.   LOS: 6 days    Tracy Vasquez E 11/27/2011

## 2011-11-27 NOTE — Discharge Instructions (Signed)
[facility] ______[facility] LAPAROSCOPIC SURGERY: POST OP INSTRUCTIONS Always review your discharge instruction sheet given to you by the facility where your surgery was performed. IF YOU HAVE DISABILITY OR FAMILY LEAVE FORMS, YOU MUST BRING THEM TO THE OFFICE FOR PROCESSING.   DO NOT GIVE THEM TO YOUR DOCTOR.  1. A prescription for pain medication may be given to you upon discharge.  Take your pain medication as prescribed, if needed.  If narcotic pain medicine is not needed, then you may take acetaminophen (Tylenol) or ibuprofen (Advil) as needed. 2. Take your usually prescribed medications unless otherwise directed. 3. If you need a refill on your pain medication, please contact your pharmacy.  They will contact our office to request authorization. Prescriptions will not be filled after 5pm or on week-ends. 4. You should follow a light diet the first few days after arrival home, such as soup and crackers, etc.  Be sure to include lots of fluids daily. 5. Most patients will experience some swelling and bruising in the area of the incisions.  Ice packs will help.  Swelling and bruising can take several days to resolve.  6. It is common to experience some constipation if taking pain medication after surgery.  Increasing fluid intake and taking a stool softener (such as Colace) will usually help or prevent this problem from occurring.  A mild laxative (Milk of Magnesia or Miralax) should be taken according to package instructions if there are no bowel movements after 48 hours. 7. Unless discharge instructions indicate otherwise, you may remove your bandages 24-48 hours after surgery, and you may shower at that time.  You may have steri-strips (small skin tapes) in place directly over the incision.  These strips should be left on the skin for 7-10 days.  If your surgeon used skin glue on the incision, you may shower in 24 hours.  The glue will flake off over the next 2-3 weeks.  Any sutures or  staples will be removed at the office during your follow-up visit. 8. ACTIVITIES:  You may resume regular (light) daily activities beginning the next day--such as daily self-care, walking, climbing stairs--gradually increasing activities as tolerated.  You may have sexual intercourse when it is comfortable.  Refrain from any heavy lifting or straining until approved by your doctor. a. You may drive when you are no longer taking prescription pain medication, you can comfortably wear a seatbelt, and you can safely maneuver your car and apply brakes. b. RETURN TO WORK:  __________________________________________________________ 9. You should see your doctor in the office for a follow-up appointment approximately 2-3 weeks after your surgery.  Make sure that you call for this appointment within a day or two after you arrive home to insure a convenient appointment time. 10. OTHER INSTRUCTIONS: __________________________________________________________________________________________________________________________ __________________________________________________________________________________________________________________________ WHEN TO CALL YOUR DOCTOR: 1. Fever over 101.0 2. Inability to urinate 3. Continued bleeding from incision. 4. Increased pain, redness, or drainage from the incision. 5. Increasing abdominal pain  The clinic staff is available to answer your questions during regular business hours.  Please don't hesitate to call and ask to speak to one of the nurses for clinical concerns.  If you have a medical emergency, go to the nearest emergency room or call 911.  A surgeon from [facility] is always on call at the hospital. [address] [place], [place], [place]  [address] ? [address], [place], [place]   [address] [phone] ? [phone] ? FAX [phone] Web site:  [email or web address]   Please call your cardiologist to set up an INR check for  next week

## 2011-12-02 ENCOUNTER — Telehealth (INDEPENDENT_AMBULATORY_CARE_PROVIDER_SITE_OTHER): Payer: Self-pay

## 2011-12-02 NOTE — Telephone Encounter (Signed)
Unsuccessful attempt to reach patient about post op appointment.  Voicemail box is full.

## 2011-12-18 ENCOUNTER — Encounter (INDEPENDENT_AMBULATORY_CARE_PROVIDER_SITE_OTHER): Payer: Self-pay | Admitting: General Surgery

## 2011-12-18 ENCOUNTER — Ambulatory Visit (INDEPENDENT_AMBULATORY_CARE_PROVIDER_SITE_OTHER): Payer: Medicare Other | Admitting: General Surgery

## 2011-12-18 VITALS — BP 106/84 | HR 70 | Temp 97.2°F | Resp 14 | Ht 64.0 in | Wt 124.0 lb

## 2011-12-18 DIAGNOSIS — K35209 Acute appendicitis with generalized peritonitis, without abscess, unspecified as to perforation: Secondary | ICD-10-CM

## 2011-12-18 DIAGNOSIS — K3532 Acute appendicitis with perforation and localized peritonitis, without abscess: Secondary | ICD-10-CM

## 2011-12-18 DIAGNOSIS — K352 Acute appendicitis with generalized peritonitis, without abscess: Secondary | ICD-10-CM

## 2011-12-18 NOTE — Assessment & Plan Note (Signed)
Doing well after lap appy  Has left periumbilical mass/thickening.  Does not seem to be mobile like stool. Will follow this up in 4-6 weeks.  If still present, would work up as potential malignancy.  Likely inflammatory given post surgical nature.

## 2011-12-18 NOTE — Progress Notes (Signed)
HISTORY: Doing well after lap appy for perforated gangrenous appendicitis.  No fevers/ chills.  Appetite still low, but improving.  Energy level low, but improving as well.  She is having normal bowel movements and is off narcotics.      EXAM: General:  Alert and oriented.  Well dressed/ groomed.   Incision:  Incisions well healed.  Left 3 cm periumbilical mass/ abdominal wall thickening.     PATHOLOGY: Acute gangrenous appendicitis.     ASSESSMENT AND PLAN:   Acute gangrenous appendicitis with perforation and peritonitis Doing well after lap appy  Has left periumbilical mass/thickening.  Does not seem to be mobile like stool. Will follow this up in 4-6 weeks.  If still present, would work up as potential malignancy.  Likely inflammatory given post surgical nature.        Milus Height, MD Surgical Oncology, General & Endocrine Surgery [facility]  Provider Not In System No ref. provider found

## 2011-12-18 NOTE — Patient Instructions (Signed)
Ok to liberalize activity.  Follow up in 4-6 weeks if abdominal wall thickening/mass does not go away.  Otherwise follow up as needed.

## 2012-01-22 ENCOUNTER — Encounter (INDEPENDENT_AMBULATORY_CARE_PROVIDER_SITE_OTHER): Payer: Medicare Other | Admitting: General Surgery

## 2012-02-22 ENCOUNTER — Encounter (INDEPENDENT_AMBULATORY_CARE_PROVIDER_SITE_OTHER): Payer: Medicare Other | Admitting: General Surgery

## 2012-05-27 ENCOUNTER — Encounter (INDEPENDENT_AMBULATORY_CARE_PROVIDER_SITE_OTHER): Payer: Medicare Other | Admitting: General Surgery

## 2012-07-11 LAB — PROTIME-INR: INR: 3 — AB (ref 0.9–1.1)

## 2012-08-15 ENCOUNTER — Emergency Department (INDEPENDENT_AMBULATORY_CARE_PROVIDER_SITE_OTHER): Payer: Medicare Other

## 2012-08-15 ENCOUNTER — Ambulatory Visit (HOSPITAL_COMMUNITY)
Admit: 2012-08-15 | Discharge: 2012-08-15 | Disposition: A | Discharge status: Home / Self Care | Payer: Medicare Other | Attending: Emergency Medicine | Admitting: Emergency Medicine

## 2012-08-15 ENCOUNTER — Emergency Department (HOSPITAL_COMMUNITY)
Admission: EM | Admit: 2012-08-15 | Discharge: 2012-08-15 | Disposition: A | Discharge status: Home / Self Care | Payer: Medicare Other | Source: Home / Self Care | Attending: Emergency Medicine | Admitting: Emergency Medicine

## 2012-08-15 ENCOUNTER — Encounter (HOSPITAL_COMMUNITY): Payer: Self-pay | Admitting: *Deleted

## 2012-08-15 DIAGNOSIS — M7989 Other specified soft tissue disorders: Secondary | ICD-10-CM | POA: Insufficient documentation

## 2012-08-15 DIAGNOSIS — T148XXA Other injury of unspecified body region, initial encounter: Secondary | ICD-10-CM

## 2012-08-15 DIAGNOSIS — Z950 Presence of cardiac pacemaker: Secondary | ICD-10-CM

## 2012-08-15 DIAGNOSIS — I4891 Unspecified atrial fibrillation: Secondary | ICD-10-CM

## 2012-08-15 DIAGNOSIS — M79609 Pain in unspecified limb: Secondary | ICD-10-CM | POA: Insufficient documentation

## 2012-08-15 LAB — CBC WITH DIFFERENTIAL/PLATELET
Basophils Absolute: 0.1 10*3/uL (ref 0.0–0.1)
Basophils Relative: 1 % (ref 0–1)
Eosinophils Absolute: 0.1 10*3/uL (ref 0.0–0.7)
Eosinophils Relative: 1 % (ref 0–5)
HCT: 40.3 % (ref 36.0–46.0)
Hemoglobin: 13.4 g/dL (ref 12.0–15.0)
MCH: 30.8 pg (ref 26.0–34.0)
MCHC: 33.3 g/dL (ref 30.0–36.0)
Monocytes Absolute: 0.5 10*3/uL (ref 0.1–1.0)
Monocytes Relative: 6 % (ref 3–12)
Neutro Abs: 5.5 10*3/uL (ref 1.7–7.7)
RDW: 13.8 % (ref 11.5–15.5)

## 2012-08-15 MED ORDER — ACETAMINOPHEN 325 MG PO TABS
650.0000 mg | ORAL_TABLET | Freq: Once | ORAL | Status: AC
  Administered 2012-08-15: 650 mg via ORAL

## 2012-08-15 MED ORDER — HYDROCODONE-ACETAMINOPHEN 5-325 MG PO TABS
1.0000 | ORAL_TABLET | Freq: Once | ORAL | Status: AC
  Administered 2012-08-15: 1 via ORAL

## 2012-08-15 MED ORDER — HYDROCODONE-ACETAMINOPHEN 5-325 MG PO TABS: ORAL_TABLET | ORAL | Status: DC

## 2012-08-15 MED ORDER — HYDROCODONE-ACETAMINOPHEN 5-325 MG PO TABS
ORAL_TABLET | ORAL | Status: AC
  Filled 2012-08-15: qty 1

## 2012-08-15 MED ORDER — PHYTONADIONE 5 MG PO TABS: 2.5000 mg | ORAL_TABLET | Freq: Once | ORAL | Status: DC

## 2012-08-15 MED ORDER — ACETAMINOPHEN 325 MG PO TABS
ORAL_TABLET | ORAL | Status: AC
  Filled 2012-08-15: qty 2

## 2012-08-15 NOTE — Progress Notes (Signed)
*  Preliminary Results* Right upper extremity venous duplex completed. Right upper extremity is negative for deep vein thrombosis. There is a 5.6 x 3.8cm hematoma noted in the right forearm. Preliminary results discussed with Shonna Chock of urgent care. Patient was sent back to urgent care to see Dr.Keller.  08/15/2012 3:20 PM Maudry Mayhew, RDMS, RDCS

## 2012-08-15 NOTE — ED Notes (Signed)
Pt  Has  Redness   And  Swelling  Of  The  r  Arm  X  sev  Days    Began  Becoming  painfull  One  Day   Ago        Pt     Is         Awake  And  Alert        She takes  Coumadin

## 2012-08-15 NOTE — ED Provider Notes (Signed)
Chief Complaint  Patient presents with  . Arm Injury    History of Present Illness:   Tracy Vasquez is an 79 year old female who presents with a two-day history of right arm swelling and pain. She denies any injury to the arm. The swelling involves the proximal forearm. It's tender to touch. It also hurts to move the wrist and elbow. There is no numbness or tingling. No discoloration of the fingers. She is able to move all her digits well. She's had no fever or chills. She is on Coumadin for atrial fibrillation.  Review of Systems:  Other than noted above, the patient denies any of the following symptoms: Systemic:  No fevers, chills, sweats, or aches.  No fatigue or tiredness. Musculoskeletal:  No joint pain, arthritis, bursitis, swelling, back pain, or neck pain. Neurological:  No muscular weakness, paresthesias, headache, or trouble with speech or coordination.  No dizziness.  Vina:  Past medical history, family history, social history, meds, and allergies were reviewed.  Physical Exam:   Vital signs:  BP 123/76  Pulse 87  Temp(Src) 98.6 F (37 C) (Oral)  Resp 16  SpO2 99% Gen:  Alert and oriented times 3.  In no distress. Musculoskeletal: There is swelling, erythema, bruising, and pain to palpation over the dorsal aspect of the proximal forearm extending to the elbow and about halfway down the forearm. This is tender to touch.  Otherwise, all joints had a full a ROM with no swelling, bruising or deformity.  No edema, pulses full. Extremities were warm and pink.  Capillary refill was brisk.  Skin:  Clear, warm and dry.  No rash. Neuro:  Alert and oriented times 3.  Muscle strength was normal.  Sensation was intact to light touch.   Results for orders placed during the hospital encounter of 08/15/12  CBC WITH DIFFERENTIAL      Result Value Range   WBC 8.5  4.0 - 10.5 K/uL   RBC 4.35  3.87 - 5.11 MIL/uL   Hemoglobin 13.4  12.0 - 15.0 g/dL   HCT 40.3  36.0 - 46.0 %   MCV 92.6  78.0  - 100.0 fL   MCH 30.8  26.0 - 34.0 pg   MCHC 33.3  30.0 - 36.0 g/dL   RDW 13.8  11.5 - 15.5 %   Platelets 201  150 - 400 K/uL   Neutrophils Relative 65  43 - 77 %   Neutro Abs 5.5  1.7 - 7.7 K/uL   Lymphocytes Relative 27  12 - 46 %   Lymphs Abs 2.3  0.7 - 4.0 K/uL   Monocytes Relative 6  3 - 12 %   Monocytes Absolute 0.5  0.1 - 1.0 K/uL   Eosinophils Relative 1  0 - 5 %   Eosinophils Absolute 0.1  0.0 - 0.7 K/uL   Basophils Relative 1  0 - 1 %   Basophils Absolute 0.1  0.0 - 0.1 K/uL  PROTIME-INR      Result Value Range   Prothrombin Time 40.4 (*) 11.6 - 15.2 seconds   INR 4.57 (*) 0.00 - 1.49   Radiology:  Dg Forearm Right  08/15/2012  *RADIOLOGY REPORT*  Clinical Data: Right arm injury and forearm pain.  RIGHT FOREARM - 2 VIEW  Comparison: None.  Findings: Soft tissue swelling is present over the dorsum of proximal forearm without an underlying fracture.  The elbow and wrist joints are intact.  There is no significant elbow effusion.  IMPRESSION:  1.  Soft tissue swelling about the proximal forearm without an underlying fracture.   Original Report Authenticated By: San Morelle, M.D.    I reviewed the images independently and personally and concur with the radiologist's findings.  Right Upper Extremity Venous Duplex:  Right upper extremity venous duplex completed.  Right upper extremity is negative for deep vein thrombosis. There is a 5.6 x 3.8cm hematoma noted in the right forearm.  Preliminary results discussed with Shonna Chock of urgent care. Patient was sent back to urgent care to see Dr.Chisom Aust.   Assessment:  The encounter diagnosis was Hematoma.  This is probably due to being over anticoagulated. There is no evidence of compartment syndrome right now, but obviously she is at risk for this.  Plan:   1.  The following meds were prescribed:   Discharge Medication List as of 08/15/2012  3:33 PM    START taking these medications   Details  HYDROcodone-acetaminophen  (NORCO/VICODIN) 5-325 MG per tablet 1 to 2 tabs every 4 to 6 hours as needed for pain., Print    phytonadione (VITAMIN K) 5 MG tablet Take 0.5 tablets (2.5 mg total) by mouth once., Starting 08/15/2012, Normal       2.  The patient was instructed in symptomatic care, including rest and activity, elevation, application of ice and compression.  Appropriate handouts were given. 3.  The patient was told to return if becoming worse in any way, if no better in 3 or 4 days, and given some red flag symptoms that would indicate earlier return.   4.  The patient was told to follow up with her cardiologist, Dr. Crissie Sickles tomorrow. She does have an appointment to see him already. She'll need to have her pro time rechecked. I also outlined with her the symptoms of compartment syndrome, and she was given handouts of the same. I told her if she should develop any of these symptoms to go directly to the emergency room.    Harden Mo, MD 08/15/12 2119

## 2012-08-16 ENCOUNTER — Ambulatory Visit (INDEPENDENT_AMBULATORY_CARE_PROVIDER_SITE_OTHER): Payer: Medicare Other | Admitting: *Deleted

## 2012-08-16 ENCOUNTER — Telehealth: Payer: Self-pay | Admitting: Internal Medicine

## 2012-08-16 ENCOUNTER — Encounter: Payer: Self-pay | Admitting: Internal Medicine

## 2012-08-16 ENCOUNTER — Ambulatory Visit (INDEPENDENT_AMBULATORY_CARE_PROVIDER_SITE_OTHER): Payer: Medicare Other | Admitting: Internal Medicine

## 2012-08-16 ENCOUNTER — Other Ambulatory Visit: Payer: Self-pay | Admitting: Internal Medicine

## 2012-08-16 VITALS — BP 140/82 | HR 70 | Ht 64.0 in | Wt 125.4 lb

## 2012-08-16 DIAGNOSIS — I4891 Unspecified atrial fibrillation: Secondary | ICD-10-CM

## 2012-08-16 DIAGNOSIS — Z950 Presence of cardiac pacemaker: Secondary | ICD-10-CM | POA: Insufficient documentation

## 2012-08-16 DIAGNOSIS — T148XXA Other injury of unspecified body region, initial encounter: Secondary | ICD-10-CM

## 2012-08-16 LAB — PACEMAKER DEVICE OBSERVATION
AL AMPLITUDE: 2.2 mv
AL IMPEDENCE PM: 520 Ohm
ATRIAL PACING PM: 94
RV LEAD IMPEDENCE PM: 520 Ohm
RV LEAD THRESHOLD: 0.5 V
VENTRICULAR PACING PM: 1

## 2012-08-16 MED ORDER — RIVAROXABAN 15 MG PO TABS: 15.0000 mg | ORAL_TABLET | Freq: Every day | ORAL | Status: DC

## 2012-08-16 NOTE — Patient Instructions (Addendum)
Stop Coumadin, in 3 days start Xarelto 15mg  daily with your evening meal.  Your physician wants you to follow-up in: 6 months with Dr. Lovena Le.  You will receive a reminder letter in the mail two months in advance. If you don't receive a letter, please call our office to schedule the follow-up appointment.  Your physician recommends that you schedule a follow-up appointment in: 1 months as a new Xarelto patient in the Coumadin Clinic.

## 2012-08-16 NOTE — Assessment & Plan Note (Signed)
Distal pulses in her right arm are intact. I expect resolution of hematoma over the next several weeks.

## 2012-08-16 NOTE — Progress Notes (Signed)
HPI Tracy Vasquez is referred today by Dr. Chuck Hint in [facility] for ongoing evaluation and management of symptomatic tachycardia bradycardia syndrome, atrial fibrillation and hypertension. She is status post permanent pacemaker insertion. She is recently moved to [place] from [place] times [place] to be closer to family. She is felt well for many years after undergoing permanent pacemaker insertion and initiation of amiodarone therapy. She denies chest pain or shortness of breath. She has been on warfarin, and recently had a spontaneous hematoma in the right forearm in the setting of an INR of 5. Her warfarin has been on hold. The patient is interested in one of the novel agents. She denies syncope or near-syncope. No peripheral edema. No Known Allergies    Current Outpatient Prescriptions  Medication Sig Dispense Refill  . amiodarone (PACERONE) 200 MG tablet Take 200 mg by mouth daily.      . cholecalciferol (VITAMIN D) 1000 UNITS tablet Take 1,000 Units by mouth daily.      Marland Kitchen co-enzyme Q-10 50 MG capsule Take 50 mg by mouth daily.      . digoxin (LANOXIN) 0.125 MG tablet Take 125 mcg by mouth daily.      . metoprolol succinate (TOPROL-XL) 25 MG 24 hr tablet Take 25 mg by mouth 2 (two) times daily.      . phytonadione (VITAMIN K) 5 MG tablet Take 0.5 tablets (2.5 mg total) by mouth once.  0.5 tablet  0  . vitamin C (ASCORBIC ACID) 500 MG tablet Take 500 mg by mouth daily.      Marland Kitchen warfarin (COUMADIN) 1 MG tablet Take 1 mg by mouth daily.      Marland Kitchen HYDROcodone-acetaminophen (NORCO/VICODIN) 5-325 MG per tablet 1 to 2 tabs every 4 to 6 hours as needed for pain.  20 tablet  0   No current facility-administered medications for this visit.     Past Medical History  Diagnosis Date  . CVA (cerebral infarction)   . Atrial fibrillation   . Pacemaker     ROS:   All systems reviewed and negative except as noted in the HPI.   Past Surgical History  Procedure Laterality Date  .  Pacemaker insertion    . Insert / replace / remove pacemaker    . Laparoscopic appendectomy  11/22/2011    Procedure: APPENDECTOMY LAPAROSCOPIC;  Surgeon: Stark Klein, MD;  Location: [facility];  Service: General;  Laterality: N/A;  . Appendectomy       No family history on file.   History   Social History  . Marital Status: Widowed    Spouse Name: N/A    Number of Children: N/A  . Years of Education: N/A   Occupational History  . Not on file.   Social History Main Topics  . Smoking status: Never Smoker   . Smokeless tobacco: Never Used  . Alcohol Use: No  . Drug Use: No  . Sexually Active:    Other Topics Concern  . Not on file   Social History Narrative  . No narrative on file     BP 140/82  Pulse 70  Ht 5\' 4"  (1.626 m)  Wt 125 lb 6.4 oz (56.881 kg)  BMI 21.51 kg/m2  Physical Exam:  Well appearing elderly woman, NAD HEENT: Unremarkable Neck:  No JVD, no thyromegally Lungs:  Clear with no wheezes, rales, or rhonchi. HEART:  Regular rate rhythm, no murmurs, no rubs, no clicks Abd:  soft, positive bowel sounds, no organomegally, no rebound, no guarding  Ext:  2 plus pulses, no edema, no cyanosis, no clubbing, her right arm with modest hematoma under the forearm Skin:  No rashes no nodules Neuro:  CN II through XII intact, motor grossly intact  EKG sinus rhythm with atrial pacing  DEVICE  Normal device function.  See PaceArt for details.   Assess/Plan:

## 2012-08-16 NOTE — Assessment & Plan Note (Signed)
The patient's atrial fibrillation appears to be well-controlled. She has had a spontaneous bleed on warfarin therapy. I've asked the patient to hold warfarin for 3 days. We will start Xarelto 15 mg daily. We'll have her back to her anticoagulation clinic in approximately a month. She will continue low-dose amiodarone. No signs of amiodarone toxicity at this point.

## 2012-08-16 NOTE — Progress Notes (Signed)
Pacer check in clinic  

## 2012-08-16 NOTE — Assessment & Plan Note (Signed)
Pacemaker interrogation demonstrates a IT trainer which is working normally.

## 2012-08-16 NOTE — Telephone Encounter (Signed)
Records REceived From [facility] Gave these to [facility]  08/16/12/KM

## 2012-08-16 NOTE — Telephone Encounter (Signed)
ROI faxed To [facility] @ [phone]  08/16/12/KM

## 2012-08-19 ENCOUNTER — Encounter (HOSPITAL_COMMUNITY): Payer: Self-pay

## 2012-08-19 ENCOUNTER — Emergency Department (HOSPITAL_COMMUNITY)
Admission: EM | Admit: 2012-08-19 | Discharge: 2012-08-19 | Disposition: A | Discharge status: Home / Self Care | Payer: Medicare Other | Attending: Emergency Medicine | Admitting: Emergency Medicine

## 2012-08-19 DIAGNOSIS — Z7982 Long term (current) use of aspirin: Secondary | ICD-10-CM | POA: Insufficient documentation

## 2012-08-19 DIAGNOSIS — R110 Nausea: Secondary | ICD-10-CM | POA: Insufficient documentation

## 2012-08-19 DIAGNOSIS — Y939 Activity, unspecified: Secondary | ICD-10-CM | POA: Insufficient documentation

## 2012-08-19 DIAGNOSIS — Z8673 Personal history of transient ischemic attack (TIA), and cerebral infarction without residual deficits: Secondary | ICD-10-CM | POA: Insufficient documentation

## 2012-08-19 DIAGNOSIS — X58XXXA Exposure to other specified factors, initial encounter: Secondary | ICD-10-CM | POA: Insufficient documentation

## 2012-08-19 DIAGNOSIS — R630 Anorexia: Secondary | ICD-10-CM | POA: Insufficient documentation

## 2012-08-19 DIAGNOSIS — R42 Dizziness and giddiness: Secondary | ICD-10-CM | POA: Insufficient documentation

## 2012-08-19 DIAGNOSIS — T148XXA Other injury of unspecified body region, initial encounter: Secondary | ICD-10-CM

## 2012-08-19 DIAGNOSIS — S40029A Contusion of unspecified upper arm, initial encounter: Secondary | ICD-10-CM | POA: Insufficient documentation

## 2012-08-19 DIAGNOSIS — Z8679 Personal history of other diseases of the circulatory system: Secondary | ICD-10-CM | POA: Insufficient documentation

## 2012-08-19 DIAGNOSIS — Z79899 Other long term (current) drug therapy: Secondary | ICD-10-CM | POA: Insufficient documentation

## 2012-08-19 DIAGNOSIS — Z950 Presence of cardiac pacemaker: Secondary | ICD-10-CM | POA: Insufficient documentation

## 2012-08-19 DIAGNOSIS — E860 Dehydration: Secondary | ICD-10-CM

## 2012-08-19 DIAGNOSIS — R791 Abnormal coagulation profile: Secondary | ICD-10-CM

## 2012-08-19 DIAGNOSIS — Y929 Unspecified place or not applicable: Secondary | ICD-10-CM | POA: Insufficient documentation

## 2012-08-19 LAB — CBC WITH DIFFERENTIAL/PLATELET
Basophils Absolute: 0.1 K/uL (ref 0.0–0.1)
Basophils Relative: 0 % (ref 0–1)
Eosinophils Absolute: 0.1 10*3/uL (ref 0.0–0.7)
Eosinophils Relative: 0 % (ref 0–5)
HCT: 37.2 % (ref 36.0–46.0)
Hemoglobin: 12.2 g/dL (ref 12.0–15.0)
Lymphocytes Relative: 8 % — ABNORMAL LOW (ref 12–46)
Lymphs Abs: 1 10*3/uL (ref 0.7–4.0)
MCH: 30.5 pg (ref 26.0–34.0)
MCHC: 32.8 g/dL (ref 30.0–36.0)
MCV: 93 fL (ref 78.0–100.0)
Monocytes Absolute: 0.5 10*3/uL (ref 0.1–1.0)
Monocytes Relative: 4 % (ref 3–12)
Neutro Abs: 10.8 K/uL — ABNORMAL HIGH (ref 1.7–7.7)
Neutrophils Relative %: 87 % — ABNORMAL HIGH (ref 43–77)
Platelets: 225 K/uL (ref 150–400)
RBC: 4 MIL/uL (ref 3.87–5.11)
RDW: 13.8 % (ref 11.5–15.5)
WBC: 12.4 K/uL — ABNORMAL HIGH (ref 4.0–10.5)

## 2012-08-19 LAB — BASIC METABOLIC PANEL
BUN: 23 mg/dL (ref 6–23)
Calcium: 9.2 mg/dL (ref 8.4–10.5)
Creatinine, Ser: 1.12 mg/dL — ABNORMAL HIGH (ref 0.50–1.10)
GFR calc non Af Amer: 45 mL/min — ABNORMAL LOW (ref >90)
Glucose, Bld: 114 mg/dL — ABNORMAL HIGH (ref 70–99)

## 2012-08-19 LAB — PROTIME-INR
INR: 4.56 — ABNORMAL HIGH (ref 0.00–1.49)
Prothrombin Time: 40.3 s — ABNORMAL HIGH (ref 11.6–15.2)

## 2012-08-19 LAB — HEPATIC FUNCTION PANEL
ALT: 24 U/L (ref 0–35)
AST: 35 U/L (ref 0–37)
Alkaline Phosphatase: 66 U/L (ref 39–117)
Bilirubin, Direct: 0.1 mg/dL (ref 0.0–0.3)

## 2012-08-19 LAB — URINALYSIS, ROUTINE W REFLEX MICROSCOPIC
Bilirubin Urine: NEGATIVE
Glucose, UA: NEGATIVE mg/dL
Hgb urine dipstick: NEGATIVE
Ketones, ur: NEGATIVE mg/dL
Leukocytes, UA: NEGATIVE
pH: 6 (ref 5.0–8.0)

## 2012-08-19 LAB — BASIC METABOLIC PANEL WITH GFR
CO2: 30 meq/L (ref 19–32)
Chloride: 103 meq/L (ref 96–112)
GFR calc Af Amer: 52 mL/min — ABNORMAL LOW (ref >90)
Potassium: 4.9 meq/L (ref 3.5–5.1)
Sodium: 141 meq/L (ref 135–145)

## 2012-08-19 MED ORDER — SODIUM CHLORIDE 0.9 % IV SOLN
INTRAVENOUS | Status: DC
  Administered 2012-08-19: 23:00:00 via INTRAVENOUS

## 2012-08-19 MED ORDER — ONDANSETRON 4 MG PO TBDP
4.0000 mg | ORAL_TABLET | Freq: Once | ORAL | Status: AC
  Administered 2012-08-19: 4 mg via ORAL
  Filled 2012-08-19: qty 1

## 2012-08-19 MED ORDER — HYDROCODONE-ACETAMINOPHEN 5-325 MG PO TABS: 1.0000 | ORAL_TABLET | Freq: Four times a day (QID) | ORAL | Status: DC | PRN

## 2012-08-19 MED ORDER — ONDANSETRON 4 MG PO TBDP: 4.0000 mg | ORAL_TABLET | Freq: Four times a day (QID) | ORAL | Status: DC | PRN

## 2012-08-19 MED ORDER — HYDROCODONE-ACETAMINOPHEN 5-325 MG PO TABS
1.0000 | ORAL_TABLET | Freq: Once | ORAL | Status: AC
  Administered 2012-08-19: 1 via ORAL
  Filled 2012-08-19: qty 1

## 2012-08-19 MED ORDER — SODIUM CHLORIDE 0.9 % IV BOLUS (SEPSIS)
500.0000 mL | Freq: Once | INTRAVENOUS | Status: AC
  Administered 2012-08-19: 500 mL via INTRAVENOUS

## 2012-08-19 NOTE — ED Notes (Signed)
Pt states she noticed swelling to right posterior forearm on Saturday. Pt was seen at [facility] on Monday and diagnosed with a hematoma. Tuesday pt saw cardiologist for routine visit and he discontinued her coumadin and put her on xeralto as he thought it was the cause of the hematoma. Pt noticed increased nausea on Tuesday and has not been able to eat or drink for the past 2 days. Pt also c/o some chest "tightness" and SOB on and off with exertion.

## 2012-08-19 NOTE — ED Notes (Addendum)
Pt was seen at [facility] Monday for bruising and swelling to right forearm since Saturday and has progressively been getting worse. Pt reports nausea and unable to eat anything. Was seen at the cardiologist on Tuesday morning and switched to Xarelto. Reports some chest tightness that started today. Daughter reports pt has been dizzy when she changes position and is unsteady on her feet. Has been very tired recently.

## 2012-08-19 NOTE — ED Provider Notes (Signed)
History     CSN: [id number]  Arrival date & time 08/19/12  1752   First MD Initiated Contact with Patient 08/19/12 1958      Chief Complaint  Patient presents with  . Arm Swelling  . Bleeding/Bruising  . on coumadin     (Consider location/radiation/quality/duration/timing/severity/associated sxs/prior treatment) HPI Comments: Tracy Vasquez is a 79 y.o. female with a history of atrial fibrillation, CVA and pacemaker presents emergency department complaining of worsened right upper extremity swelling and pain.  Patient was recently diagnosed on February 10 via a right upper extremity Doppler with a 5.6 x 3.8 hematoma with likely etiology of super therapeutic INR.  At that time patient was switched from Coumadin to [facility] and started on oral potassium.  Patient was evaluated the next day by new cardiologist Dr. Crissie Sickles who urinated her pacemaker with good findings.  Note the patient just moved to the area from [place] and is currently establishing a relationship with cardiologist and primary care physicians.  Patient states that the swelling of her right upper extremity is slowly worsening and she feels "tiredness in her chest" associated with anorexia and nausea.  Patient denies any stroke like symptoms including weakness, slurred speech, ataxia, disequilibrium and syncope, urinary symptoms melena, hematochezia, emesis, fevers, night sweats or chills.  Patient states she does not want to be admitted to the hospital she is just concerned that she is dehydrated because she feels generally weak all over.  Patient has no other complaints at this time.  Patient verbalizes that she has not been taking her Coumadin for the last 4 days.  The history is provided by the patient.    Past Medical History  Diagnosis Date  . CVA (cerebral infarction)   . Atrial fibrillation   . Pacemaker     Past Surgical History  Procedure Laterality Date  . Pacemaker insertion    . Insert / replace / remove  pacemaker    . Laparoscopic appendectomy  11/22/2011    Procedure: APPENDECTOMY LAPAROSCOPIC;  Surgeon: Stark Klein, MD;  Location: [facility];  Service: General;  Laterality: N/A;  . Appendectomy      No family history on file.  History  Substance Use Topics  . Smoking status: Never Smoker   . Smokeless tobacco: Never Used  . Alcohol Use: No    OB History   Grav Para Term Preterm Abortions TAB SAB Ect Mult Living                  Review of Systems  All other systems reviewed and are negative.    Allergies  Review of patient's allergies indicates no known allergies.  Home Medications   Current Outpatient Rx  Name  Route  Sig  Dispense  Refill  . amiodarone (PACERONE) 200 MG tablet   Oral   Take 200 mg by mouth daily.         Marland Kitchen aspirin EC 81 MG tablet   Oral   Take 81 mg by mouth daily.         . cholecalciferol (VITAMIN D) 1000 UNITS tablet   Oral   Take 1,000 Units by mouth daily.         Marland Kitchen co-enzyme Q-10 50 MG capsule   Oral   Take 50 mg by mouth daily.         . digoxin (LANOXIN) 0.125 MG tablet   Oral   Take 125 mcg by mouth daily.         Marland Kitchen  metoprolol succinate (TOPROL-XL) 25 MG 24 hr tablet   Oral   Take 25 mg by mouth 2 (two) times daily.         . Rivaroxaban (XARELTO) 15 MG TABS tablet   Oral   Take 15 mg by mouth daily.           BP 127/54  Pulse 72  Temp(Src) 98.5 F (36.9 C) (Oral)  Resp 18  SpO2 96%  Physical Exam  Nursing note and vitals reviewed. Constitutional: She is oriented to person, place, and time. She appears well-developed and well-nourished. No distress.  HENT:  Head: Normocephalic and atraumatic.  Eyes: Conjunctivae and EOM are normal.  Neck: Normal range of motion.  Cardiovascular:  Intact distal pulses.  No pitting edema.  Pulmonary/Chest: Effort normal.  Musculoskeletal: Normal range of motion.  Inability to perform a complete flexion and extension do to pain of right upper extremity.  Tenderness to  palpation throughout.  Normal range of motion of fingers  Neurological: She is alert and oriented to person, place, and time.  Skin: Skin is warm and dry. No rash noted. She is not diaphoretic.  Hyperpigmentation of skin located in the right upper extremity. Primarily swelling over hematoma location of the dorsal elbow.  Psychiatric: She has a normal mood and affect. Her behavior is normal.    ED Course  Procedures (including critical care time)  Labs Reviewed  CBC WITH DIFFERENTIAL - Abnormal; Notable for the following:    WBC 12.4 (*)    Neutrophils Relative 87 (*)    Neutro Abs 10.8 (*)    Lymphocytes Relative 8 (*)    All other components within normal limits  BASIC METABOLIC PANEL - Abnormal; Notable for the following:    Glucose, Bld 114 (*)    Creatinine, Ser 1.12 (*)    GFR calc non Af Amer 45 (*)    GFR calc Af Amer 52 (*)    All other components within normal limits  PROTIME-INR - Abnormal; Notable for the following:    Prothrombin Time 40.3 (*)    INR 4.56 (*)    All other components within normal limits  URINALYSIS, ROUTINE W REFLEX MICROSCOPIC  HEPATIC FUNCTION PANEL   No results found.   No diagnosis found.   Consult with our cardiology: Discussed patient's supratherapeutic INR he regardless of stopping Coumadin x4 days.  Patient will be advised to discontinue taking his or although over the weekend with close clinical followup first thing Monday or Tuesday morning for PT INR recheck and possible restart of Xeralto.   MDM  Right upper extremity hematoma Supratherapeutic INR  79 year old female presents emergency department complaining of right upper trauma the worsened pain and swelling from the hematoma that was diagnosed on February 10.  She stopped eating her Coumadin for 4 days and still has a supratherapeutic INR.  Consult Mary[facility] Shape cardiology as above.  Case discussed with attending who was advised to check liver function tests as patient's INR is still  supratherapeutic.  Patient has been given IV fluids for rehydration.  A likely plan if liver function tests are normal is to advised patient to discontinue taking Xeralto and have strict followup for outpatient clinic with cardiologist Monday or Tuesday morning.      Verl Dicker, [place] 08/19/12 2250

## 2012-08-20 NOTE — ED Provider Notes (Signed)
Medical screening examination/treatment/procedure(s) were performed by non-physician practitioner and as supervising physician I was immediately available for consultation/collaboration.   Saddie Benders. Trustin Chapa, MD 08/20/12 0040

## 2012-08-22 ENCOUNTER — Encounter: Payer: Self-pay | Admitting: Internal Medicine

## 2012-08-23 ENCOUNTER — Ambulatory Visit (INDEPENDENT_AMBULATORY_CARE_PROVIDER_SITE_OTHER): Payer: Medicare Other | Admitting: *Deleted

## 2012-08-23 DIAGNOSIS — I4891 Unspecified atrial fibrillation: Secondary | ICD-10-CM

## 2012-08-23 DIAGNOSIS — Z950 Presence of cardiac pacemaker: Secondary | ICD-10-CM

## 2012-08-23 NOTE — Patient Instructions (Addendum)
PT instructed to restart Xarelto 15mg  daily on 08/24/2012 and appt made for her to return to clinic on 09/20/2012 at  11:15am

## 2012-08-23 NOTE — Progress Notes (Signed)
Pt was started on Xarelto  15mg  daily  for Atrial Fib on 08/16/2012.    Reviewed patients medication list.  Pt is not currently on any combined P-gp and strong CYP3A4 inhibitors/inducers (ketoconazole, traconazole, ritonavir, carbamazepine, phenytoin, rifampin, St. John's wort).  Reviewed labs.  SCr 1.12, Weight  56.88 kg, CrCl 36.01.  Dose is  appropriate based on CrCl.   Hgb  12.2 and HCT 37.2  A full discussion of the nature of anticoagulants has been carried out.  A benefit/risk analysis has been presented to the patient, so that they understand the justification for choosing anticoagulation with Xarelto at this time.  The need for compliance is stressed.  Pt is aware to take the medication once daily with the largest meal of the day.  Side effects of potential bleeding are discussed, including unusual colored urine or stools, coughing up blood or coffee ground emesis, nose bleeds or serious fall or head trauma.  Discussed signs and symptoms of stroke. The patient should avoid any OTC items containing aspirin or ibuprofen.  Avoid alcohol consumption.   Call if any signs of abnormal bleeding.  Discussed financial obligations and resolved any difficulty in obtaining medication.  Next lab test  in 1 month Appt made  Pt states that she was seen  By Dr Lovena Le on 08/16/2012 had  large hematoma right arm and INR was 5  coumadin on hold for 3 days and instructed  to start Xarelto 15mg  daily  on 08/16/2012. Pt states her arm became more painful and she went to ER on 08/19/2012 and was instructed by ER physician to hold Xarelto until seen in clinic on 08/23/2012, Bonnita Nasuti Pharmacist talked with Richardson Dopp and he states to have her to hold Xarelto one more day making total of 5 days and then to restart Xarelto 15mg  daily .Pt given these instructions and she verbalized understanding. Marland Kitchen

## 2012-09-19 ENCOUNTER — Other Ambulatory Visit: Payer: Self-pay | Admitting: *Deleted

## 2012-09-19 MED ORDER — DIGOXIN 125 MCG PO TABS: 125.0000 ug | ORAL_TABLET | Freq: Every day | ORAL | Status: DC

## 2012-09-19 MED ORDER — RIVAROXABAN 15 MG PO TABS: 15.0000 mg | ORAL_TABLET | Freq: Every day | ORAL | Status: DC

## 2012-09-19 MED ORDER — AMIODARONE HCL 200 MG PO TABS: 200.0000 mg | ORAL_TABLET | Freq: Every day | ORAL | Status: DC

## 2012-09-19 MED ORDER — METOPROLOL SUCCINATE ER 25 MG PO TB24: 25.0000 mg | ORAL_TABLET | Freq: Two times a day (BID) | ORAL | Status: DC

## 2012-09-21 ENCOUNTER — Ambulatory Visit (INDEPENDENT_AMBULATORY_CARE_PROVIDER_SITE_OTHER): Payer: Medicare Other | Admitting: *Deleted

## 2012-09-21 DIAGNOSIS — I4891 Unspecified atrial fibrillation: Secondary | ICD-10-CM

## 2012-09-21 LAB — BASIC METABOLIC PANEL
BUN: 23 mg/dL (ref 6–23)
Creatinine, Ser: 1.6 mg/dL — ABNORMAL HIGH (ref 0.4–1.2)
GFR: 32.4 mL/min — ABNORMAL LOW (ref >60.00)
Potassium: 4.8 mEq/L (ref 3.5–5.1)

## 2012-09-21 LAB — CBC
MCHC: 33.4 g/dL (ref 30.0–36.0)
MCV: 92.3 fl (ref 78.0–100.0)
Platelets: 222 10*3/uL (ref 150.0–400.0)
RDW: 14.6 % (ref 11.5–14.6)

## 2012-09-21 NOTE — Patient Instructions (Signed)
   A full discussion of the nature of anticoagulants has been carried out.  A benefit/risk analysis has been presented to the patient, so that they understand the justification for choosing anticoagulation with Xarelto at this time.  The need for compliance is stressed.  Pt is aware to take the medication once daily with the largest meal of the day.  Side effects of potential bleeding are discussed, including unusual colored urine or stools, coughing up blood or coffee ground emesis, nose bleeds or serious fall or head trauma.  Discussed signs and symptoms of stroke. The patient should avoid any OTC items containing aspirin or ibuprofen.  Avoid alcohol consumption.   Call if any signs of abnormal bleeding.  Discussed financial obligations and resolved any difficulty in obtaining medication.  Next lab test test in 6 months. Call [phone] with any problems

## 2012-09-21 NOTE — Progress Notes (Signed)
Pt was started on Xarelto for Atrrial Fib on 08/16/2012.    Reviewed patients medication list.  Pt is not  currently on any combined P-gp and strong CYP3A4 inhibitors/inducers (ketoconazole, traconazole, ritonavir, carbamazepine, phenytoin, rifampin, St. John's wort).  Reviewed labs.  SCr 1.6, Weight 56.36kg , CrCl  24.53.  Dose is appropriate  based on CrCl   Xarelto 15mg  daily   Hgb 12.2  and HCT 36.5  A full discussion of the nature of anticoagulants has been carried out.  A benefit/risk analysis has been presented to the patient, so that they understand the justification for choosing anticoagulation with Xarelto at this time.  The need for compliance is stressed.  Pt is aware to take the medication once daily with the largest meal of the day.  Side effects of potential bleeding are discussed, including unusual colored urine or stools, coughing up blood or coffee ground emesis, nose bleeds or serious fall or head trauma.  Discussed signs and symptoms of stroke. The patient should avoid any OTC items containing aspirin or ibuprofen.  Avoid alcohol consumption.   Call if any signs of abnormal bleeding.  Discussed financial obligations and resolved any difficulty in obtaining medication.  Next lab test test in 6 months. CBC and BMET done on this visit 09/22/2012  Called and spoke with pt and informed of labs and that she is on correct dose of Xarelto regarding creatinine clearance and staff message sent to Dr Asa Lente for her to note  pts bmet done on 09/21/2012.

## 2012-10-03 ENCOUNTER — Telehealth: Payer: Self-pay | Admitting: *Deleted

## 2012-10-03 NOTE — Telephone Encounter (Signed)
Talked with pt and made her an appt to be seen last of April to recheck her bmet and cbc  Due to increase in creatinine and pt states understanding

## 2012-11-02 ENCOUNTER — Ambulatory Visit (INDEPENDENT_AMBULATORY_CARE_PROVIDER_SITE_OTHER): Payer: Medicare Other | Admitting: *Deleted

## 2012-11-02 DIAGNOSIS — K3532 Acute appendicitis with perforation and localized peritonitis, without abscess: Secondary | ICD-10-CM

## 2012-11-02 DIAGNOSIS — I4891 Unspecified atrial fibrillation: Secondary | ICD-10-CM

## 2012-11-02 DIAGNOSIS — K352 Acute appendicitis with generalized peritonitis, without abscess: Secondary | ICD-10-CM

## 2012-11-02 DIAGNOSIS — T148XXA Other injury of unspecified body region, initial encounter: Secondary | ICD-10-CM

## 2012-11-02 LAB — CBC WITH DIFFERENTIAL/PLATELET
Basophils Absolute: 0 10*3/uL (ref 0.0–0.1)
Hemoglobin: 13 g/dL (ref 12.0–15.0)
Lymphocytes Relative: 24 % (ref 12.0–46.0)
Monocytes Relative: 7.4 % (ref 3.0–12.0)
Platelets: 209 10*3/uL (ref 150.0–400.0)
RDW: 13.6 % (ref 11.5–14.6)

## 2012-11-02 LAB — BASIC METABOLIC PANEL
CO2: 31 mEq/L (ref 19–32)
Chloride: 104 mEq/L (ref 96–112)
Potassium: 5.6 mEq/L — ABNORMAL HIGH (ref 3.5–5.1)
Sodium: 139 mEq/L (ref 135–145)

## 2012-11-02 NOTE — Progress Notes (Signed)
Pt was started on Xarelto for  15mg  daily on 08/16/2012 for Atrial Fib.    Reviewed patients medication list.  Pt is not  currently on any combined P-gp and strong CYP3A4 inhibitors/inducers (ketoconazole, traconazole, ritonavir, carbamazepine, phenytoin, rifampin, St. John's wort).  Reviewed labs.  SCr 1.2, Weight  56.09kg, CrCl-32.55.  Dose is appropriate based on CrCl.   Hgb 13.0 and HCT 38.6  A full discussion of the nature of anticoagulants has been carried out.  A benefit/risk analysis has been presented to the patient, so that they understand the justification for choosing anticoagulation with Xarelto at this time.  The need for compliance is stressed.  Pt is aware to take the medication once daily with the largest meal of the day.  Side effects of potential bleeding are discussed, including unusual colored urine or stools, coughing up blood or coffee ground emesis, nose bleeds or serious fall or head trauma.  Discussed signs and symptoms of stroke. The patient should avoid any OTC items containing aspirin or ibuprofen.  Avoid alcohol consumption.   Call if any signs of abnormal bleeding.  Discussed financial obligations and resolved any difficulty in obtaining medication.  Next lab test test in 6 months. 11/02/2012 called pt and informed her that her Xarelto dose is correct based on CrCl  But her potassium is 5.6 and that this nurse had talked with Dr Lovena Le and he instructed her to avoid foods with potassium and that she is to have repeat potassium in one week and appt made and pt states understanding

## 2012-11-03 ENCOUNTER — Telehealth: Payer: Self-pay | Admitting: *Deleted

## 2012-11-03 NOTE — Telephone Encounter (Signed)
Pt called and informed nurse that she had been eating a lot of strawberries this past week and she states they are rich in potassium so she wanted to inform us of this

## 2012-11-08 NOTE — Progress Notes (Signed)
Reduce potassium in diet and repeat BMP in 2 weeks.

## 2012-11-09 ENCOUNTER — Other Ambulatory Visit: Payer: Medicare Other

## 2012-11-09 ENCOUNTER — Ambulatory Visit: Payer: Medicare Other | Admitting: Lab

## 2012-11-09 ENCOUNTER — Other Ambulatory Visit: Payer: Self-pay

## 2012-11-09 DIAGNOSIS — E875 Hyperkalemia: Secondary | ICD-10-CM

## 2012-11-09 DIAGNOSIS — T148XXA Other injury of unspecified body region, initial encounter: Secondary | ICD-10-CM

## 2012-11-09 DIAGNOSIS — I4891 Unspecified atrial fibrillation: Secondary | ICD-10-CM

## 2012-11-09 DIAGNOSIS — K3532 Acute appendicitis with perforation and localized peritonitis, without abscess: Secondary | ICD-10-CM

## 2012-11-10 ENCOUNTER — Other Ambulatory Visit (INDEPENDENT_AMBULATORY_CARE_PROVIDER_SITE_OTHER): Payer: Medicare Other

## 2012-11-10 DIAGNOSIS — E875 Hyperkalemia: Secondary | ICD-10-CM

## 2012-11-10 LAB — BASIC METABOLIC PANEL
CO2: 27 mEq/L (ref 19–32)
GFR: 40.67 mL/min — ABNORMAL LOW (ref >60.00)
Glucose, Bld: 88 mg/dL (ref 70–99)
Potassium: 5 mEq/L (ref 3.5–5.1)
Sodium: 138 mEq/L (ref 135–145)

## 2012-11-30 ENCOUNTER — Telehealth: Payer: Self-pay | Admitting: Internal Medicine

## 2012-11-30 NOTE — Telephone Encounter (Signed)
New Prob      Pt would like to know when she needs to come in again for a coumadin check. Please call.

## 2012-11-30 NOTE — Telephone Encounter (Signed)
Called pt and talked with her and she is on Xarelto and needs 6 months appt and an appt made for May 01, 2013 at 11:30am and pt states understanding

## 2012-12-28 ENCOUNTER — Encounter: Payer: Self-pay | Admitting: Internal Medicine

## 2012-12-29 ENCOUNTER — Ambulatory Visit (INDEPENDENT_AMBULATORY_CARE_PROVIDER_SITE_OTHER): Payer: Medicare Other | Admitting: Internal Medicine

## 2012-12-29 ENCOUNTER — Encounter: Payer: Self-pay | Admitting: Internal Medicine

## 2012-12-29 VITALS — BP 132/78 | HR 71 | Temp 97.5°F | Ht 64.0 in | Wt 124.1 lb

## 2012-12-29 DIAGNOSIS — Z0000 Encounter for general adult medical examination without abnormal findings: Secondary | ICD-10-CM

## 2012-12-29 DIAGNOSIS — I4891 Unspecified atrial fibrillation: Secondary | ICD-10-CM

## 2012-12-29 DIAGNOSIS — M810 Age-related osteoporosis without current pathological fracture: Secondary | ICD-10-CM

## 2012-12-29 MED ORDER — ALENDRONATE SODIUM 70 MG PO TABS: 70.0000 mg | ORAL_TABLET | ORAL | Status: DC

## 2012-12-29 NOTE — Patient Instructions (Signed)
It was good to see you today. We have reviewed your prior records including labs and tests today Health Maintenance reviewed - all recommended immunizations and age-appropriate screenings are up-to-date or declined. we will send to your prior provider(s) for "release of records" as discussed today. Medications reviewed and updated, no changes recommended at this time. Please schedule followup in 12 months for annual visit, call sooner if problems.  Health Maintenance, Females A healthy lifestyle and preventative care can promote health and wellness.  Maintain regular health, dental, and eye exams.  Eat a healthy diet. Foods like vegetables, fruits, whole grains, low-fat dairy products, and lean protein foods contain the nutrients you need without too many calories. Decrease your intake of foods high in solid fats, added sugars, and salt. Get information about a proper diet from your caregiver, if necessary.  Regular physical exercise is one of the most important things you can do for your health. Most adults should get at least 150 minutes of moderate-intensity exercise (any activity that increases your heart rate and causes you to sweat) each week. In addition, most adults need muscle-strengthening exercises on 2 or more days a week.   Maintain a healthy weight. The body mass index (BMI) is a screening tool to identify possible weight problems. It provides an estimate of body fat based on height and weight. Your caregiver can help determine your BMI, and can help you achieve or maintain a healthy weight. For adults 20 years and older:  A BMI below 18.5 is considered underweight.  A BMI of 18.5 to 24.9 is normal.  A BMI of 25 to 29.9 is considered overweight.  A BMI of 30 and above is considered obese.  Maintain normal blood lipids and cholesterol by exercising and minimizing your intake of saturated fat. Eat a balanced diet with plenty of fruits and vegetables. Blood tests for lipids and  cholesterol should begin at age 10 and be repeated every 5 years. If your lipid or cholesterol levels are high, you are over 50, or you are a high risk for heart disease, you may need your cholesterol levels checked more frequently.Ongoing high lipid and cholesterol levels should be treated with medicines if diet and exercise are not effective.  If you smoke, find out from your caregiver how to quit. If you do not use tobacco, do not start.  If you are pregnant, do not drink alcohol. If you are breastfeeding, be very cautious about drinking alcohol. If you are not pregnant and choose to drink alcohol, do not exceed 1 drink per day. One drink is considered to be 12 ounces (355 mL) of beer, 5 ounces (148 mL) of wine, or 1.5 ounces (44 mL) of liquor.  Avoid use of street drugs. Do not share needles with anyone. Ask for help if you need support or instructions about stopping the use of drugs.  High blood pressure causes heart disease and increases the risk of stroke. Blood pressure should be checked at least every 1 to 2 years. Ongoing high blood pressure should be treated with medicines, if weight loss and exercise are not effective.  If you are 33 to 77 years old, ask your caregiver if you should take aspirin to prevent strokes.  Diabetes screening involves taking a blood sample to check your fasting blood sugar level. This should be done once every 3 years, after age 84, if you are within normal weight and without risk factors for diabetes. Testing should be considered at a younger age or  be carried out more frequently if you are overweight and have at least 1 risk factor for diabetes.  Breast cancer screening is essential preventative care for women. You should practice "breast self-awareness." This means understanding the normal appearance and feel of your breasts and may include breast self-examination. Any changes detected, no matter how small, should be reported to a caregiver. Women in their 58s  and 30s should have a clinical breast exam (CBE) by a caregiver as part of a regular health exam every 1 to 3 years. After age 13, women should have a CBE every year. Starting at age 30, women should consider having a mammogram (breast X-ray) every year. Women who have a family history of breast cancer should talk to their caregiver about genetic screening. Women at a high risk of breast cancer should talk to their caregiver about having an MRI and a mammogram every year.  The Pap test is a screening test for cervical cancer. Women should have a Pap test starting at age 34. Between ages 12 and 86, Pap tests should be repeated every 2 years. Beginning at age 2, you should have a Pap test every 3 years as long as the past 3 Pap tests have been normal. If you had a hysterectomy for a problem that was not cancer or a condition that could lead to cancer, then you no longer need Pap tests. If you are between ages 84 and 83, and you have had normal Pap tests going back 10 years, you no longer need Pap tests. If you have had past treatment for cervical cancer or a condition that could lead to cancer, you need Pap tests and screening for cancer for at least 20 years after your treatment. If Pap tests have been discontinued, risk factors (such as a new sexual partner) need to be reassessed to determine if screening should be resumed. Some women have medical problems that increase the chance of getting cervical cancer. In these cases, your caregiver may recommend more frequent screening and Pap tests.  The human papillomavirus (HPV) test is an additional test that may be used for cervical cancer screening. The HPV test looks for the virus that can cause the cell changes on the cervix. The cells collected during the Pap test can be tested for HPV. The HPV test could be used to screen women aged 18 years and older, and should be used in women of any age who have unclear Pap test results. After the age of 45, women should  have HPV testing at the same frequency as a Pap test.  Colorectal cancer can be detected and often prevented. Most routine colorectal cancer screening begins at the age of 69 and continues through age 74. However, your caregiver may recommend screening at an earlier age if you have risk factors for colon cancer. On a yearly basis, your caregiver may provide home test kits to check for hidden blood in the stool. Use of a small camera at the end of a tube, to directly examine the colon (sigmoidoscopy or colonoscopy), can detect the earliest forms of colorectal cancer. Talk to your caregiver about this at age 44, when routine screening begins. Direct examination of the colon should be repeated every 5 to 10 years through age 9, unless early forms of pre-cancerous polyps or small growths are found.  Hepatitis C blood testing is recommended for all people born from [birth year] through 1965 and any individual with known risks for hepatitis C.  Practice safe sex. Use  condoms and avoid high-risk sexual practices to reduce the spread of sexually transmitted infections (STIs). Sexually active women aged 57 and younger should be checked for Chlamydia, which is a common sexually transmitted infection. Older women with new or multiple partners should also be tested for Chlamydia. Testing for other STIs is recommended if you are sexually active and at increased risk.  Osteoporosis is a disease in which the bones lose minerals and strength with aging. This can result in serious bone fractures. The risk of osteoporosis can be identified using a bone density scan. Women ages 40 and over and women at risk for fractures or osteoporosis should discuss screening with their caregivers. Ask your caregiver whether you should be taking a calcium supplement or vitamin D to reduce the rate of osteoporosis.  Menopause can be associated with physical symptoms and risks. Hormone replacement therapy is available to decrease symptoms and  risks. You should talk to your caregiver about whether hormone replacement therapy is right for you.  Use sunscreen with a sun protection factor (SPF) of 30 or greater. Apply sunscreen liberally and repeatedly throughout the day. You should seek shade when your shadow is shorter than you. Protect yourself by wearing long sleeves, pants, a wide-brimmed hat, and sunglasses year round, whenever you are outdoors.  Notify your caregiver of new moles or changes in moles, especially if there is a change in shape or color. Also notify your caregiver if a mole is larger than the size of a pencil eraser.  Stay current with your immunizations. Document Released: 01/05/2011 Document Revised: 09/14/2011 Document Reviewed: 01/05/2011 [facility] Patient Information 2014 [place].

## 2012-12-29 NOTE — Progress Notes (Signed)
Subjective:    Patient ID: Tracy Vasquez, female    DOB: [birth year], 79 y.o.   MRN: [id number]  HPI  New patient to me and our division, here to establish with primary care provider Known to our cardiology division   Here for medicare wellness  Diet: heart healthy  Physical activity: active Depression/mood screen: negative Hearing: intact to whispered voice Visual acuity: grossly normal, performs annual eye exam  ADLs: capable Fall risk: none Home safety: good Cognitive evaluation: intact to orientation, naming, recall and repetition EOL planning: adv directives, full code/ I agree  I have personally reviewed and have noted 1. The patient's medical and social history 2. Their use of alcohol, tobacco or illicit drugs 3. Their current medications and supplements 4. The patient's functional ability including ADL's, fall risks, home safety risks and hearing or visual impairment. 5. Diet and physical activities 6. Evidence for depression or mood disorders   Reviewed chronic medical issues today:  History of stroke 2008 - no residual deficits, transient L HP at time of embolic event - no recurrence or symptoms -   Atrial fibrillation - on chronic anticoagulation, complicated by spontaneous hematoma/bleed February 2014 prompting change from warfarin to Xarelto. Rate controlled on amiodarone, digoxin and beta blocker - follows with cardiology for same  Osteoporosis. Takes Fosamax weekly - no history of fracture   Past Medical History  Diagnosis Date  . CVA (cerebral infarction)   . Atrial fibrillation     chronic anticoag, amio and dig  . Hematoma 08/16/2012    spont bleed on warfarin 08/2012  . Acute gangrenous appendicitis with perforation and peritonitis 11/22/2011  . Osteoporosis    History reviewed. No pertinent family history.  History  Substance Use Topics  . Smoking status: Never Smoker   . Smokeless tobacco: Never Used  . Alcohol Use: No    Review of  Systems Constitutional: Negative for fever or weight change.  Respiratory: Negative for cough and shortness of breath.   Cardiovascular: Negative for chest pain or palpitations.  Gastrointestinal: Negative for abdominal pain, no bowel changes.  Musculoskeletal: Negative for gait problem or joint swelling.  Skin: Negative for rash.  Neurological: Negative for dizziness or headache.  No other specific complaints in a complete review of systems (except as listed in HPI above).     Objective:   Physical Exam BP 132/78  Pulse 71  Temp(Src) 97.5 F (36.4 C) (Oral)  Ht 5\' 4"  (1.626 m)  Wt 124 lb 1.9 oz (56.3 kg)  BMI 21.29 kg/m2  SpO2 97% Wt Readings from Last 3 Encounters:  12/29/12 124 lb 1.9 oz (56.3 kg)  08/16/12 125 lb 6.4 oz (56.881 kg)  12/18/11 124 lb (56.246 kg)   Constitutional: She appears well-developed and well-nourished. No distress.  HENT: Head: Normocephalic and atraumatic. Ears: B TMs ok, no erythema or effusion; Nose: Nose normal. Mouth/Throat: Oropharynx is clear and moist. No oropharyngeal exudate.  Eyes: Conjunctivae and EOM are normal. Pupils are equal, round, and reactive to light. No scleral icterus.  Neck: Normal range of motion. Neck supple. No JVD present. No thyromegaly present.  Cardiovascular: Normal rate, regular rhythm and normal heart sounds.  No murmur heard. No BLE edema. Pulmonary/Chest: Effort normal and breath sounds normal. No respiratory distress. She has no wheezes.  Abdominal: Soft. Bowel sounds are normal. She exhibits no distension. There is no tenderness. no masses Musculoskeletal: Normal range of motion, no joint effusions. No gross deformities Neurological: She is alert and oriented to person,  place, and time. No cranial nerve deficit. Coordination, balance, strength, speech and gait are normal.  Skin: Skin is warm and dry. No rash noted. No erythema.  Psychiatric: She has a normal mood and affect. Her behavior is normal. Judgment and  thought content normal.   Lab Results  Component Value Date   WBC 9.3 11/02/2012   HGB 13.0 11/02/2012   HCT 38.6 11/02/2012   PLT 209.0 11/02/2012   GLUCOSE 88 11/10/2012   ALT 24 08/19/2012   AST 35 08/19/2012   NA 138 11/10/2012   K 5.0 11/10/2012   CL 104 11/10/2012   CREATININE 1.3* 11/10/2012   BUN 21 11/10/2012   CO2 27 11/10/2012   INR 4.56* 08/19/2012        Assessment & Plan:   AWV/CPX -v70.0 - Today patient counseled on age appropriate routine health concerns for screening and prevention, each reviewed and up to date or declined. Immunizations reviewed and up to date or declined. Labs reviewed. Risk factors for depression reviewed and negative. Hearing function and visual acuity are intact. ADLs screened and addressed as needed. Functional ability and level of safety reviewed and appropriate. Education, counseling and referrals performed based on assessed risks today. Patient provided with a copy of personalized plan for preventive services.  See problem list. Medications and labs reviewed today.  Time spent with pt today 45 minutes, greater than 50% time spent counseling patient on CVA hx, AF and osteoporosis; also medication review and review of prior records + plans to obtain ROI from prior PCP

## 2013-02-06 ENCOUNTER — Encounter: Payer: Self-pay | Admitting: Internal Medicine

## 2013-03-01 ENCOUNTER — Encounter: Payer: Medicare Other | Admitting: Internal Medicine

## 2013-03-14 ENCOUNTER — Encounter: Payer: Self-pay | Admitting: Internal Medicine

## 2013-03-17 ENCOUNTER — Encounter: Payer: Medicare Other | Admitting: Internal Medicine

## 2013-04-04 ENCOUNTER — Ambulatory Visit (INDEPENDENT_AMBULATORY_CARE_PROVIDER_SITE_OTHER): Payer: Medicare Other | Admitting: Cardiology

## 2013-04-04 ENCOUNTER — Encounter: Payer: Self-pay | Admitting: Cardiology

## 2013-04-04 VITALS — BP 133/60 | HR 70 | Ht 64.0 in | Wt 126.0 lb

## 2013-04-04 DIAGNOSIS — I4891 Unspecified atrial fibrillation: Secondary | ICD-10-CM

## 2013-04-04 DIAGNOSIS — Z950 Presence of cardiac pacemaker: Secondary | ICD-10-CM

## 2013-04-04 DIAGNOSIS — I495 Sick sinus syndrome: Secondary | ICD-10-CM

## 2013-04-04 LAB — PACEMAKER DEVICE OBSERVATION
AL IMPEDENCE PM: 540 Ohm
AL THRESHOLD: 0.6 V

## 2013-04-04 LAB — HEPATIC FUNCTION PANEL
AST: 23 U/L (ref 0–37)
Alkaline Phosphatase: 60 U/L (ref 39–117)
Total Bilirubin: 0.8 mg/dL (ref 0.3–1.2)

## 2013-04-04 LAB — TSH: TSH: 2.29 u[IU]/mL (ref 0.35–5.50)

## 2013-04-04 NOTE — Progress Notes (Signed)
ELECTROPHYSIOLOGY OFFICE NOTE  Patient ID: Tracy Vasquez MRN: [id number], DOB/AGE: 1930/04/02   Date of Visit: 04/04/2013  Primary Physician: Gwendolyn Grant, MD Primary Cardiologist: Cristopher Peru, MD Reason for Visit: EP/device follow-up  History of Present Illness  Tracy Vasquez is a 79 y.o. female with tachy-brady syndrome s/p PPM implant, PAF and prior CVA who presents today for routine electrophysiology followup. Since last being seen in our clinic, she reports she is doing well and has no complaints. She denies chest pain or shortness of breath. She denies palpitations, dizziness, near syncope or syncope. She denies LE swelling, orthopnea, PND or recent weight gain. She is compliant and tolerating medications without difficulty.  Past Medical History Past Medical History  Diagnosis Date  . CVA (cerebral infarction) 2008    no residual deficits  . Atrial fibrillation     chronic anticoag, amio and dig  . Hematoma 08/16/2012    spont bleed on warfarin 08/2012  . Acute gangrenous appendicitis with perforation and peritonitis 11/22/2011  . Osteoporosis   . Anxiety   . Depression     Past Surgical History Past Surgical History  Procedure Laterality Date  . Pacemaker insertion    . Insert / replace / remove pacemaker    . Laparoscopic appendectomy  11/22/2011    Procedure: APPENDECTOMY LAPAROSCOPIC;  Surgeon: Stark Klein, MD;  Location: [facility];  Service: General;  Laterality: N/A;  . Appendectomy      Allergies/Intolerances No Known Allergies  Current Home Medications Current Outpatient Prescriptions  Medication Sig Dispense Refill  . alendronate (FOSAMAX) 70 MG tablet Take 1 tablet (70 mg total) by mouth every 7 (seven) days. Take with a full glass of water on an empty stomach.  12 tablet  3  . amiodarone (PACERONE) 200 MG tablet Take 1 tablet (200 mg total) by mouth daily.  90 tablet  3  . aspirin EC 81 MG tablet Take 81 mg by mouth daily.      . cholecalciferol (VITAMIN  D) 1000 UNITS tablet Take 1,000 Units by mouth daily.      Marland Kitchen co-enzyme Q-10 50 MG capsule Take 50 mg by mouth daily.      . digoxin (LANOXIN) 0.125 MG tablet Take 1 tablet (125 mcg total) by mouth daily.  90 tablet  3  . metoprolol succinate (TOPROL-XL) 25 MG 24 hr tablet Take 1 tablet (25 mg total) by mouth 2 (two) times daily.  90 tablet  3  . Rivaroxaban (XARELTO) 15 MG TABS tablet Take 1 tablet (15 mg total) by mouth daily.  90 tablet  3   No current facility-administered medications for this visit.    Social History Social History  . Marital Status: Widowed   Social History Main Topics  . Smoking status: Never Smoker   . Smokeless tobacco: Never Used  . Alcohol Use: No  . Drug Use: No   Social History Narrative   Lives with dtr, moved to [place] in [place], widowed early 2013 (12 years married)    Review of Systems General: No chills, fever, night sweats or weight changes Cardiovascular: No chest pain, dyspnea on exertion, edema, orthopnea, palpitations, paroxysmal nocturnal dyspnea Dermatological: No rash, lesions or masses Respiratory: No cough, dyspnea Urologic: No hematuria, dysuria Abdominal: No nausea, vomiting, diarrhea, bright red blood per rectum, melena, or hematemesis Neurologic: No visual changes, weakness, changes in mental status All other systems reviewed and are otherwise negative except as noted above.  Physical Exam Vitals: Blood pressure  133/60, pulse 70, height 5\' 4"  (1.626 m), weight 126 lb (57.153 kg).  General: Well developed, well appearing 79 y.o. female in no acute distress. HEENT: Normocephalic, atraumatic. EOMs intact. Sclera nonicteric. Oropharynx clear.  Neck: Supple. No JVD. Lungs: Respirations regular and unlabored, CTA bilaterally. No wheezes, rales or rhonchi. Heart: RRR. S1, S2 present. No murmurs, rub, S3 or S4. Abdomen: Soft, non-distended.  Extremities: No clubbing, cyanosis or edema. PT/Radials 2+ and equal  bilaterally. Psych: Normal affect. Neuro: Alert and oriented X 3. Moves all extremities spontaneously.   Diagnostics BMET    Component Value Date/Time   NA 138 11/10/2012 1010   K 5.0 11/10/2012 1010   CL 104 11/10/2012 1010   CO2 27 11/10/2012 1010   GLUCOSE 88 11/10/2012 1010   BUN 21 11/10/2012 1010   CREATININE 1.3* 11/10/2012 1010   CALCIUM 9.0 11/10/2012 1010   GFRNONAA 45* 08/19/2012 1836   GFRAA 52* 08/19/2012 1836    Device interrogation today - Normal device function. Thresholds, sensing, impedances consistent with previous measurements. Device programmed to maximize longevity. Mode switch 1% of time, longest 1.2 hours. No high ventricular rates noted. Device programmed at appropriate safety margins. Histogram distribution appropriate for patient activity level. Device programmed to optimize intrinsic conduction. Estimated longevity 3 years.   Assessment and Plan 1. Tachy-brady syndrome s/p PPM implant - normal device function - no programming changes made - return for follow-up with Dr. Lovena Le in 6 months 2. PAF - stable - well controlled by device interrogation today - continue amiodarone and will check surveillance labs today (LFTs, TSH) - continue rate control with digoxin and metoprolol - continue Xarelto as directed by Dr. Lovena Le and routine follow-up in anticoagulation clinic  Signed, Ileene Hutchinson, PA-C 04/04/2013, 9:53 AM

## 2013-04-04 NOTE — Patient Instructions (Signed)
Your physician recommends that you return for lab work in: TODAY (TSH, LFT)   Your physician recommends that you schedule a follow-up appointment in: DR. Lovena Le IN 6 MONTHS  Your physician recommends that you continue on your current medications as directed. Please refer to the Current Medication list given to you today.

## 2013-04-06 ENCOUNTER — Telehealth: Payer: Self-pay | Admitting: *Deleted

## 2013-04-06 NOTE — Telephone Encounter (Signed)
Called pt with lab results. Pt showed understanding. Pt states she is taking 200 mg of her Amiodarone QOD and 100 mg on the other days. Pt states it's been this way since 2013.

## 2013-04-19 ENCOUNTER — Other Ambulatory Visit: Payer: Self-pay | Admitting: Internal Medicine

## 2013-04-20 ENCOUNTER — Ambulatory Visit (INDEPENDENT_AMBULATORY_CARE_PROVIDER_SITE_OTHER): Payer: Medicare Other

## 2013-04-20 DIAGNOSIS — Z23 Encounter for immunization: Secondary | ICD-10-CM

## 2013-04-26 ENCOUNTER — Encounter: Payer: Self-pay | Admitting: Internal Medicine

## 2013-05-01 ENCOUNTER — Ambulatory Visit (INDEPENDENT_AMBULATORY_CARE_PROVIDER_SITE_OTHER): Payer: Medicare Other | Admitting: Pharmacist

## 2013-05-01 DIAGNOSIS — I4891 Unspecified atrial fibrillation: Secondary | ICD-10-CM

## 2013-05-01 LAB — CBC
Hemoglobin: 12.4 g/dL (ref 12.0–15.0)
MCV: 91.8 fl (ref 78.0–100.0)
Platelets: 206 10*3/uL (ref 150.0–400.0)
RBC: 3.99 Mil/uL (ref 3.87–5.11)
RDW: 13.7 % (ref 11.5–14.6)
WBC: 8.8 10*3/uL (ref 4.5–10.5)

## 2013-05-01 LAB — BASIC METABOLIC PANEL
BUN: 22 mg/dL (ref 6–23)
GFR: 42.46 mL/min — ABNORMAL LOW (ref >60.00)
Glucose, Bld: 83 mg/dL (ref 70–99)
Potassium: 5 mEq/L (ref 3.5–5.1)
Sodium: 140 mEq/L (ref 135–145)

## 2013-05-01 NOTE — Progress Notes (Signed)
Pt was started on Xarelto 15 mg daily for Atrial fibrillation on 08/16/2012   Reviewed patients medication list.  Pt is not currently on any combined P-gp and strong CYP3A4 inhibitors/inducers (ketoconazole, traconazole, ritonavir, carbamazepine, phenytoin, rifampin, St. John's wort).  Reviewed labs.  SCr 1.3, Weight 54 kg, CrCl- 30 mL/min.  Dose appropriate based on CrCl.   Hgb and HCT Within Normal Limits  A full discussion of the nature of anticoagulants has been carried out.  A benefit/risk analysis has been presented to the patient, so that they understand the justification for choosing anticoagulation with Xarelto at this time.  The need for compliance is stressed.  Pt is aware to take the medication once daily with the largest meal of the day.  Side effects of potential bleeding are discussed, including unusual colored urine or stools, coughing up blood or coffee ground emesis, nose bleeds or serious fall or head trauma.  Discussed signs and symptoms of stroke. The patient should avoid any OTC items containing aspirin or ibuprofen.  Avoid alcohol consumption.   Call if any signs of abnormal bleeding.  Discussed financial obligations and resolved any difficulty in obtaining medication.  Next lab test test in 6 months.

## 2013-05-17 ENCOUNTER — Other Ambulatory Visit: Payer: Self-pay | Admitting: *Deleted

## 2013-05-17 ENCOUNTER — Other Ambulatory Visit: Payer: Self-pay

## 2013-05-17 MED ORDER — METOPROLOL SUCCINATE ER 25 MG PO TB24: 25.0000 mg | ORAL_TABLET | Freq: Two times a day (BID) | ORAL | Status: DC

## 2013-06-02 MED ORDER — METOPROLOL SUCCINATE ER 25 MG PO TB24: 25.0000 mg | ORAL_TABLET | Freq: Two times a day (BID) | ORAL | Status: DC

## 2013-06-02 NOTE — Addendum Note (Signed)
Addended by: Merlene Laughter on: 06/02/2013 02:00 PM   Modules accepted: Orders

## 2013-06-19 ENCOUNTER — Other Ambulatory Visit: Payer: Self-pay | Admitting: *Deleted

## 2013-06-19 MED ORDER — DIGOXIN 125 MCG PO TABS: 125.0000 ug | ORAL_TABLET | Freq: Every day | ORAL | Status: DC

## 2013-06-19 MED ORDER — RIVAROXABAN 15 MG PO TABS: 15.0000 mg | ORAL_TABLET | Freq: Every day | ORAL | Status: DC

## 2013-08-23 ENCOUNTER — Encounter: Payer: Self-pay | Admitting: *Deleted

## 2013-10-05 ENCOUNTER — Encounter: Payer: Self-pay | Admitting: Internal Medicine

## 2013-10-05 ENCOUNTER — Ambulatory Visit (INDEPENDENT_AMBULATORY_CARE_PROVIDER_SITE_OTHER): Payer: Medicare Other | Admitting: Internal Medicine

## 2013-10-05 VITALS — BP 131/67 | HR 70 | Ht 64.0 in | Wt 123.0 lb

## 2013-10-05 DIAGNOSIS — I4891 Unspecified atrial fibrillation: Secondary | ICD-10-CM

## 2013-10-05 DIAGNOSIS — Z950 Presence of cardiac pacemaker: Secondary | ICD-10-CM

## 2013-10-05 LAB — MDC_IDC_ENUM_SESS_TYPE_INCLINIC
Brady Statistic RV Percent Paced: 1 %
Lead Channel Impedance Value: 520 Ohm
Lead Channel Impedance Value: 540 Ohm
Lead Channel Pacing Threshold Amplitude: 0.5 V
Lead Channel Pacing Threshold Pulse Width: 0.5 ms
Lead Channel Setting Pacing Amplitude: 2 V
Lead Channel Setting Pacing Amplitude: 2.4 V
Lead Channel Setting Sensing Sensitivity: 2.5 mV
MDC IDC MSMT LEADCHNL RA PACING THRESHOLD AMPLITUDE: 0.5 V
MDC IDC MSMT LEADCHNL RA PACING THRESHOLD PULSEWIDTH: 0.5 ms
MDC IDC MSMT LEADCHNL RA SENSING INTR AMPL: 3.6 mV
MDC IDC MSMT LEADCHNL RV SENSING INTR AMPL: 12 mV — AB
MDC IDC PG SERIAL: 571172
MDC IDC SESS DTM: 20150402040000
MDC IDC SET LEADCHNL RV PACING PULSEWIDTH: 0.5 ms
MDC IDC STAT BRADY RA PERCENT PACED: 94 %

## 2013-10-05 NOTE — Assessment & Plan Note (Signed)
She is maintaining NSR very nicely. Will recheck in several months

## 2013-10-05 NOTE — Patient Instructions (Signed)
Your physician wants you to follow-up in: 6 months in the device clinic and 12 months with Dr Tracy Vasquez will receive a reminder letter in the mail two months in advance. If you don't receive a letter, please call our office to schedule the follow-up appointment.

## 2013-10-05 NOTE — Progress Notes (Signed)
HPI Tracy Vasquez is referred today by Dr. Chuck Hint in [facility] for ongoing evaluation and management of symptomatic tachycardia bradycardia syndrome, atrial fibrillation and hypertension. She is status post permanent pacemaker insertion. She is recently moved to [place] from [place] to be closer to family. She is felt well for many years after undergoing permanent pacemaker insertion and initiation of amiodarone therapy. She denies chest pain or shortness of breath. She has been on warfarin, and recently had a spontaneous hematoma in the right forearm in the setting of an INR of 5. Her warfarin has been on hold. The patient was placed Xarelto several months ago. She denies syncope or near-syncope. No peripheral edema. She notes some mild aching in her abdominal area but has had no bleeding No Known Allergies    Current Outpatient Prescriptions  Medication Sig Dispense Refill  . alendronate (FOSAMAX) 70 MG tablet Take 1 tablet (70 mg total) by mouth every 7 (seven) days. Take with a full glass of water on an empty stomach.  12 tablet  3  . amiodarone (PACERONE) 200 MG tablet Taking 200 mg every other day and Taking 100 mg every other day      . aspirin EC 81 MG tablet Take 81 mg by mouth daily.      Marland Kitchen co-enzyme Q-10 50 MG capsule Take 50 mg by mouth daily.      . digoxin (LANOXIN) 0.125 MG tablet Take 1 tablet (125 mcg total) by mouth daily.  30 tablet  0  . metoprolol succinate (TOPROL-XL) 25 MG 24 hr tablet Take 1 tablet (25 mg total) by mouth 2 (two) times daily.  180 tablet  1  . Rivaroxaban (XARELTO) 15 MG TABS tablet Take 1 tablet (15 mg total) by mouth daily.  30 tablet  0   No current facility-administered medications for this visit.     Past Medical History  Diagnosis Date  . CVA (cerebral infarction) 2008    no residual deficits  . Atrial fibrillation     chronic anticoag, amio and dig  . Hematoma 08/16/2012    spont bleed on warfarin 08/2012  . Acute  gangrenous appendicitis with perforation and peritonitis 11/22/2011  . Osteoporosis   . Anxiety   . Depression     ROS:   All systems reviewed and negative except as noted in the HPI.   Past Surgical History  Procedure Laterality Date  . Pacemaker insertion    . Insert / replace / remove pacemaker    . Laparoscopic appendectomy  11/22/2011    Procedure: APPENDECTOMY LAPAROSCOPIC;  Surgeon: Stark Klein, MD;  Location: [facility];  Service: General;  Laterality: N/A;  . Appendectomy       No family history on file.   History   Social History  . Marital Status: Widowed    Spouse Name: N/A    Number of Children: N/A  . Years of Education: N/A   Occupational History  . Not on file.   Social History Main Topics  . Smoking status: Never Smoker   . Smokeless tobacco: Never Used  . Alcohol Use: No  . Drug Use: No  . Sexual Activity: Not on file   Other Topics Concern  . Not on file   Social History Narrative   Lives with dtr, moved to [place] from [facility] in 2014, widowed early 2013 (60 years married)     BP 131/67  Pulse 70  Ht 5\' 4"  (1.626 m)  Wt 123  lb (55.792 kg)  BMI 21.10 kg/m2  Physical Exam:  Well appearing elderly woman, NAD HEENT: Unremarkable Neck:  No JVD, no thyromegally Lungs:  Clear with no wheezes, rales, or rhonchi. HEART:  Regular rate rhythm, no murmurs, no rubs, no clicks Abd:  soft, positive bowel sounds, no organomegally, no rebound, no guarding Ext:  2 plus pulses, no edema, no cyanosis, no clubbing, her right arm with modest hematoma under the forearm Skin:  No rashes no nodules Neuro:  CN II through XII intact, motor grossly intact   DEVICE  Normal device function.  See PaceArt for details.   Assess/Plan:

## 2013-10-05 NOTE — Assessment & Plan Note (Signed)
Her [employer] PPM is working normally. Will recheck in several months.

## 2013-10-12 ENCOUNTER — Encounter: Payer: Self-pay | Admitting: Internal Medicine

## 2013-10-23 ENCOUNTER — Other Ambulatory Visit: Payer: Self-pay

## 2013-10-23 ENCOUNTER — Other Ambulatory Visit: Payer: Self-pay | Admitting: Internal Medicine

## 2013-10-23 MED ORDER — DIGOXIN 125 MCG PO TABS: ORAL_TABLET | ORAL | Status: DC

## 2013-10-25 ENCOUNTER — Other Ambulatory Visit: Payer: Medicare Other

## 2013-10-25 ENCOUNTER — Ambulatory Visit (INDEPENDENT_AMBULATORY_CARE_PROVIDER_SITE_OTHER): Payer: Medicare Other | Admitting: Pharmacist

## 2013-10-25 DIAGNOSIS — I4891 Unspecified atrial fibrillation: Secondary | ICD-10-CM

## 2013-10-25 DIAGNOSIS — Z79899 Other long term (current) drug therapy: Secondary | ICD-10-CM

## 2013-10-25 NOTE — Progress Notes (Signed)
Pt was started on Xarelto15 mg qd for Afib on 08/16/2012.    Reviewed patients medication list.  Pt is not currently on any combined P-gp and strong CYP3A4 inhibitors/inducers (ketoconazole, traconazole, ritonavir, carbamazepine, phenytoin, rifampin, St. John's wort).  Reviewed labs.  SCr 1.4, Weight 124 lbs (56 kg), CrCl- 30 ml/min.  Dose appropriate based on CrCl.   Renal function is stable.  Hgb and HCT acceptable, with hemoglobin of 12.8.  Patient is also on aspirin 81 mg daily.  Given her history, I discussed with Dr. Lovena Le, and he would like to discontinue aspirin, and continue Xarelto 15 mg qPM only.   Blood work stable so will recheck BMET / CBC and see [place] in clinic in 6 months. Patient notified of lab work results, and understands to d/c aspirin.  Med list updated.  A full discussion of the nature of anticoagulants has been carried out.  A benefit/risk analysis has been presented to the patient, so that they understand the justification for choosing anticoagulation with Xarelto at this time.  The need for compliance is stressed.  Pt is aware to take the medication once daily with the largest meal of the day.  Side effects of potential bleeding are discussed, including unusual colored urine or stools, coughing up blood or coffee ground emesis, nose bleeds or serious fall or head trauma.  Discussed signs and symptoms of stroke. The patient should avoid any OTC items containing aspirin or ibuprofen.  Avoid alcohol consumption.   Call if any signs of abnormal bleeding.  Discussed financial obligations and resolved any difficulty in obtaining medication.  Next lab test test in 6 months.

## 2013-10-26 ENCOUNTER — Other Ambulatory Visit: Payer: Self-pay | Admitting: Internal Medicine

## 2013-10-27 ENCOUNTER — Other Ambulatory Visit (INDEPENDENT_AMBULATORY_CARE_PROVIDER_SITE_OTHER): Payer: Medicare Other

## 2013-10-27 DIAGNOSIS — Z79899 Other long term (current) drug therapy: Secondary | ICD-10-CM

## 2013-10-27 DIAGNOSIS — I4891 Unspecified atrial fibrillation: Secondary | ICD-10-CM

## 2013-10-27 LAB — CBC
HCT: 38.7 % (ref 36.0–46.0)
Hemoglobin: 12.8 g/dL (ref 12.0–15.0)
MCHC: 33 g/dL (ref 30.0–36.0)
MCV: 93.5 fl (ref 78.0–100.0)
PLATELETS: 217 10*3/uL (ref 150.0–400.0)
RBC: 4.14 Mil/uL (ref 3.87–5.11)
RDW: 13.9 % (ref 11.5–14.6)
WBC: 8.5 10*3/uL (ref 4.5–10.5)

## 2013-10-27 LAB — BASIC METABOLIC PANEL
BUN: 24 mg/dL — ABNORMAL HIGH (ref 6–23)
CO2: 27 mEq/L (ref 19–32)
Calcium: 9.7 mg/dL (ref 8.4–10.5)
Chloride: 106 mEq/L (ref 96–112)
Creatinine, Ser: 1.4 mg/dL — ABNORMAL HIGH (ref 0.4–1.2)
GFR: 38.56 mL/min — ABNORMAL LOW (ref >60.00)
Glucose, Bld: 76 mg/dL (ref 70–99)
Potassium: 5 mEq/L (ref 3.5–5.1)
Sodium: 139 mEq/L (ref 135–145)

## 2013-10-30 ENCOUNTER — Other Ambulatory Visit: Payer: Self-pay | Admitting: *Deleted

## 2013-10-30 ENCOUNTER — Other Ambulatory Visit: Payer: Self-pay | Admitting: Internal Medicine

## 2013-10-30 ENCOUNTER — Telehealth: Payer: Self-pay

## 2013-10-30 MED ORDER — AMIODARONE HCL 200 MG PO TABS: ORAL_TABLET | ORAL | Status: DC

## 2013-10-30 MED ORDER — RIVAROXABAN 15 MG PO TABS: 15.0000 mg | ORAL_TABLET | Freq: Every day | ORAL | Status: DC

## 2013-10-30 NOTE — Patient Instructions (Signed)

## 2013-10-31 NOTE — Telephone Encounter (Signed)
Patient notified of her BMET and CBC and told to continue XArelto 15 mg qd, but to stop aspirin per Dr. Lovena Le.  She was agreeable to this.

## 2013-11-01 ENCOUNTER — Other Ambulatory Visit: Payer: Self-pay

## 2013-11-01 MED ORDER — AMIODARONE HCL 200 MG PO TABS: ORAL_TABLET | ORAL | Status: DC

## 2013-11-04 ENCOUNTER — Other Ambulatory Visit: Payer: Self-pay | Admitting: Internal Medicine

## 2013-11-06 ENCOUNTER — Other Ambulatory Visit: Payer: Self-pay

## 2013-11-06 MED ORDER — DIGOXIN 125 MCG PO TABS: ORAL_TABLET | ORAL | Status: DC

## 2013-11-09 ENCOUNTER — Telehealth: Payer: Self-pay | Admitting: Internal Medicine

## 2013-11-09 NOTE — Telephone Encounter (Signed)
New message    patient calling C/O dizziness on yesterday .    Patient stated she was doing some extra hard work on yesterday.

## 2013-11-09 NOTE — Telephone Encounter (Signed)
The day before yesterday, pt had worked hard for about 2 to 3 hours. Yesterday she took two tylenol for muscle pain and felt tired. then after taking the tylenol she felt  Dizzy and felt like she was going to faint, then she vomited x 2.  Pt states her  Daughter read an article which had  these symptoms,  Daughter   think what pt  had was a TIA. Pt is feeling fine today. Pt is aware to call back if this happened again. Pt verbalized understanding.

## 2013-12-11 ENCOUNTER — Other Ambulatory Visit: Payer: Self-pay

## 2013-12-11 MED ORDER — METOPROLOL SUCCINATE ER 25 MG PO TB24: 25.0000 mg | ORAL_TABLET | Freq: Two times a day (BID) | ORAL | Status: DC

## 2014-01-01 ENCOUNTER — Encounter: Payer: Self-pay | Admitting: Internal Medicine

## 2014-01-01 ENCOUNTER — Ambulatory Visit (INDEPENDENT_AMBULATORY_CARE_PROVIDER_SITE_OTHER): Payer: Medicare Other | Admitting: Internal Medicine

## 2014-01-01 ENCOUNTER — Encounter: Payer: Medicare Other | Admitting: Internal Medicine

## 2014-01-01 VITALS — BP 130/72 | HR 71 | Temp 97.9°F | Ht 64.0 in | Wt 121.1 lb

## 2014-01-01 DIAGNOSIS — M810 Age-related osteoporosis without current pathological fracture: Secondary | ICD-10-CM

## 2014-01-01 DIAGNOSIS — I482 Chronic atrial fibrillation, unspecified: Secondary | ICD-10-CM

## 2014-01-01 DIAGNOSIS — Z79899 Other long term (current) drug therapy: Secondary | ICD-10-CM

## 2014-01-01 DIAGNOSIS — Z0000 Encounter for general adult medical examination without abnormal findings: Secondary | ICD-10-CM

## 2014-01-01 NOTE — Progress Notes (Signed)
Pre visit review using our clinic review tool, if applicable. No additional management support is needed unless otherwise documented below in the visit note. 

## 2014-01-01 NOTE — Progress Notes (Signed)
Subjective:    Patient ID: Tracy Vasquez, female    DOB: 1929-07-14, 79 y.o.   MRN: [id number]  HPI   Here for medicare wellness  Diet: heart healthy  Physical activity: sedentary Depression/mood screen: negative Hearing: intact to whispered voice Visual acuity: grossly normal, performs annual eye exam  ADLs: capable Fall risk: none Home safety: good Cognitive evaluation: intact to orientation, naming, recall and repetition EOL planning: adv directives, full code/ I agree  I have personally reviewed and have noted 1. The patient's medical and social history 2. Their use of alcohol, tobacco or illicit drugs 3. Their current medications and supplements 4. The patient's functional ability including ADL's, fall risks, home safety risks and hearing or visual impairment. 5. Diet and physical activities 6. Evidence for depression or mood disorders  Also reviewed chronic medical issues and interval medical events  Past Medical History  Diagnosis Date  . CVA (cerebral infarction) 2008    no residual deficits  . Atrial fibrillation     chronic anticoag, amio and dig  . Hematoma 08/16/2012    spont bleed on warfarin 08/2012  . Acute gangrenous appendicitis with perforation and peritonitis 11/22/2011  . Osteoporosis   . Anxiety   . Depression    Family History  Problem Relation Age of Onset  . Coronary artery disease Mother   . Atrial fibrillation Father    History  Substance Use Topics  . Smoking status: Never Smoker   . Smokeless tobacco: Never Used  . Alcohol Use: No    Review of Systems  Constitutional: Negative for fatigue and unexpected weight change.  Respiratory: Negative for cough, shortness of breath and wheezing.   Cardiovascular: Negative for chest pain, palpitations and leg swelling.  Gastrointestinal: Negative for nausea, abdominal pain and diarrhea.  Neurological: Negative for dizziness, weakness, light-headedness and headaches.  Psychiatric/Behavioral:  Negative for dysphoric mood. The patient is not nervous/anxious.   All other systems reviewed and are negative.      Objective:   Physical Exam  BP 130/72  Pulse 71  Temp(Src) 97.9 F (36.6 C) (Oral)  Ht 5\' 4"  (1.626 m)  Wt 121 lb 1.9 oz (54.94 kg)  BMI 20.78 kg/m2  SpO2 94% Wt Readings from Last 3 Encounters:  01/01/14 121 lb 1.9 oz (54.94 kg)  10/05/13 123 lb (55.792 kg)  04/04/13 126 lb (57.153 kg)   Constitutional: She appears well-developed and well-nourished. No distress.  HENT: Head: Normocephalic and atraumatic. Ears: B TMs ok, no erythema or effusion; Nose: Nose normal. Mouth/Throat: Oropharynx is clear and moist. No oropharyngeal exudate.  Eyes: Conjunctivae and EOM are normal. Pupils are equal, round, and reactive to light. No scleral icterus.  Neck: Normal range of motion. Neck supple. No JVD present. No thyromegaly present.  Cardiovascular: Normal rate, regular rhythm and normal heart sounds.  No murmur heard. No BLE edema. Pulmonary/Chest: Effort normal and breath sounds normal. No respiratory distress. She has no wheezes.  Abdominal: Soft. Bowel sounds are normal. She exhibits no distension. There is no tenderness. no masses Musculoskeletal: Normal range of motion, no joint effusions. No gross deformities Neurological: She is alert and oriented to person, place, and time. No cranial nerve deficit. Coordination, balance, strength, speech and gait are normal.  Skin: varicose veins BLE - Skin is warm and dry. No rash noted. No erythema.  Psychiatric: She has a normal mood and affect. Her behavior is normal. Judgment and thought content normal.    Lab Results  Component Value Date  WBC 8.5 10/27/2013   HGB 12.8 10/27/2013   HCT 38.7 10/27/2013   PLT 217.0 10/27/2013   GLUCOSE 76 10/27/2013   ALT 20 04/04/2013   AST 23 04/04/2013   NA 139 10/27/2013   K 5.0 10/27/2013   CL 106 10/27/2013   CREATININE 1.4* 10/27/2013   BUN 24* 10/27/2013   CO2 27 10/27/2013   TSH 2.29  04/04/2013   INR 4.56* 08/19/2012    No results found.     Assessment & Plan:   AWV/CPX/v70.0 - Today patient counseled on age appropriate routine health concerns for screening and prevention, each reviewed and up to date or declined. Immunizations reviewed and up to date or declined. Labs ordered and reviewed. Risk factors for depression reviewed and negative. Hearing function and visual acuity are intact. ADLs screened and addressed as needed. Functional ability and level of safety reviewed and appropriate. Education, counseling and referrals performed based on assessed risks today. Patient provided with a copy of personalized plan for preventive services.  Problem List Items Addressed This Visit   Atrial fibrillation (Chronic)     Hx tachy-brady s/p ablation and PPM 2008 Also CVA 2008 with transient L HP - no recurrent symptoms On beta-blocker, amio, dig and anticoag -  check LFTs, TSH and Dig level now Follows with cards for same    Osteoporosis     DEXA 12/2012 reviewed On fosamax Check Vit D     Other Visit Diagnoses   Routine general medical examination at a health care facility    -  Primary    Encounter for long-term (current) use of other medications

## 2014-01-01 NOTE — Assessment & Plan Note (Signed)
Hx tachy-brady s/p ablation and PPM 2008 Also CVA 2008 with transient L HP - no recurrent symptoms On beta-blocker, amio, dig and anticoag -  check LFTs, TSH and Dig level now Follows with cards for same

## 2014-01-01 NOTE — Assessment & Plan Note (Signed)
DEXA 12/2012 reviewed On fosamax Check Vit D

## 2014-01-01 NOTE — Patient Instructions (Addendum)
It was good to see you today.  We have reviewed your prior records including labs and tests today  Health Maintenance reviewed - check on date of last Tetnus -this is recommended every 10 years -all other recommended immunizations and age-appropriate screenings are up-to-date.  Test(s) ordered today. Return when fasting. Your results will be released to [place] (or called to you) after review, usually within 72hours after test completion. If any changes need to be made, you will be notified at that same time.  Medications reviewed and updated, no changes recommended at this time. Will refill your skin cream as requested once known  Please schedule followup in 12 months for annual exam and labs, call sooner if problems.  Health Maintenance, Female A healthy lifestyle and preventative care can promote health and wellness.  Maintain regular health, dental, and eye exams.  Eat a healthy diet. Foods like vegetables, fruits, whole grains, low-fat dairy products, and lean protein foods contain the nutrients you need without too many calories. Decrease your intake of foods high in solid fats, added sugars, and salt. Get information about a proper diet from your caregiver, if necessary.  Regular physical exercise is one of the most important things you can do for your health. Most adults should get at least 150 minutes of moderate-intensity exercise (any activity that increases your heart rate and causes you to sweat) each week. In addition, most adults need muscle-strengthening exercises on 2 or more days a week.   Maintain a healthy weight. The body mass index (BMI) is a screening tool to identify possible weight problems. It provides an estimate of body fat based on height and weight. Your caregiver can help determine your BMI, and can help you achieve or maintain a healthy weight. For adults 20 years and older:  A BMI below 18.5 is considered underweight.  A BMI of 18.5 to 24.9 is normal.  A  BMI of 25 to 29.9 is considered overweight.  A BMI of 30 and above is considered obese.  Maintain normal blood lipids and cholesterol by exercising and minimizing your intake of saturated fat. Eat a balanced diet with plenty of fruits and vegetables. Blood tests for lipids and cholesterol should begin at age 15 and be repeated every 5 years. If your lipid or cholesterol levels are high, you are over 50, or you are a high risk for heart disease, you may need your cholesterol levels checked more frequently.Ongoing high lipid and cholesterol levels should be treated with medicines if diet and exercise are not effective.  If you smoke, find out from your caregiver how to quit. If you do not use tobacco, do not start.  Lung cancer screening is recommended for adults aged 48-80 years who are at high risk for developing lung cancer because of a history of smoking. Yearly low-dose computed tomography (CT) is recommended for people who have at least a 30-pack-year history of smoking and are a current smoker or have quit within the past 15 years. A pack year of smoking is smoking an average of 1 pack of cigarettes a day for 1 year (for example: 1 pack a day for 30 years or 2 packs a day for 15 years). Yearly screening should continue until the smoker has stopped smoking for at least 15 years. Yearly screening should also be stopped for people who develop a health problem that would prevent them from having lung cancer treatment.  If you are pregnant, do not drink alcohol. If you are breastfeeding, be  very cautious about drinking alcohol. If you are not pregnant and choose to drink alcohol, do not exceed 1 drink per day. One drink is considered to be 12 ounces (355 mL) of beer, 5 ounces (148 mL) of wine, or 1.5 ounces (44 mL) of liquor.  Avoid use of street drugs. Do not share needles with anyone. Ask for help if you need support or instructions about stopping the use of drugs.  High blood pressure causes heart  disease and increases the risk of stroke. Blood pressure should be checked at least every 1 to 2 years. Ongoing high blood pressure should be treated with medicines, if weight loss and exercise are not effective.  If you are 50 to 78 years old, ask your caregiver if you should take aspirin to prevent strokes.  Diabetes screening involves taking a blood sample to check your fasting blood sugar level. This should be done once every 3 years, after age 76, if you are within normal weight and without risk factors for diabetes. Testing should be considered at a younger age or be carried out more frequently if you are overweight and have at least 1 risk factor for diabetes.  Breast cancer screening is essential preventative care for women. You should practice "breast self-awareness." This means understanding the normal appearance and feel of your breasts and may include breast self-examination. Any changes detected, no matter how small, should be reported to a caregiver. Women in their 24s and 30s should have a clinical breast exam (CBE) by a caregiver as part of a regular health exam every 1 to 3 years. After age 75, women should have a CBE every year. Starting at age 65, women should consider having a mammogram (breast X-ray) every year. Women who have a family history of breast cancer should talk to their caregiver about genetic screening. Women at a high risk of breast cancer should talk to their caregiver about having an MRI and a mammogram every year.  Breast cancer gene (BRCA)-related cancer risk assessment is recommended for women who have family members with BRCA-related cancers. BRCA-related cancers include breast, ovarian, tubal, and peritoneal cancers. Having family members with these cancers may be associated with an increased risk for harmful changes (mutations) in the breast cancer genes BRCA1 and BRCA2. Results of the assessment will determine the need for genetic counseling and BRCA1 and BRCA2  testing.  The Pap test is a screening test for cervical cancer. Women should have a Pap test starting at age 17. Between ages 23 and 8, Pap tests should be repeated every 2 years. Beginning at age 29, you should have a Pap test every 3 years as long as the past 3 Pap tests have been normal. If you had a hysterectomy for a problem that was not cancer or a condition that could lead to cancer, then you no longer need Pap tests. If you are between ages 59 and 70, and you have had normal Pap tests going back 10 years, you no longer need Pap tests. If you have had past treatment for cervical cancer or a condition that could lead to cancer, you need Pap tests and screening for cancer for at least 20 years after your treatment. If Pap tests have been discontinued, risk factors (such as a new sexual partner) need to be reassessed to determine if screening should be resumed. Some women have medical problems that increase the chance of getting cervical cancer. In these cases, your caregiver may recommend more frequent screening and Pap  tests.  The human papillomavirus (HPV) test is an additional test that may be used for cervical cancer screening. The HPV test looks for the virus that can cause the cell changes on the cervix. The cells collected during the Pap test can be tested for HPV. The HPV test could be used to screen women aged 63 years and older, and should be used in women of any age who have unclear Pap test results. After the age of 29, women should have HPV testing at the same frequency as a Pap test.  Colorectal cancer can be detected and often prevented. Most routine colorectal cancer screening begins at the age of 82 and continues through age 55. However, your caregiver may recommend screening at an earlier age if you have risk factors for colon cancer. On a yearly basis, your caregiver may provide home test kits to check for hidden blood in the stool. Use of a small camera at the end of a tube, to  directly examine the colon (sigmoidoscopy or colonoscopy), can detect the earliest forms of colorectal cancer. Talk to your caregiver about this at age 67, when routine screening begins. Direct examination of the colon should be repeated every 5 to 10 years through age 10, unless early forms of pre-cancerous polyps or small growths are found.  Hepatitis C blood testing is recommended for all people born from [birth year] through 1963 and any individual with known risks for hepatitis C.  Practice safe sex. Use condoms and avoid high-risk sexual practices to reduce the spread of sexually transmitted infections (STIs). Sexually active women aged 82 and younger should be checked for Chlamydia, which is a common sexually transmitted infection. Older women with new or multiple partners should also be tested for Chlamydia. Testing for other STIs is recommended if you are sexually active and at increased risk.  Osteoporosis is a disease in which the bones lose minerals and strength with aging. This can result in serious bone fractures. The risk of osteoporosis can be identified using a bone density scan. Women ages 41 and over and women at risk for fractures or osteoporosis should discuss screening with their caregivers. Ask your caregiver whether you should be taking a calcium supplement or vitamin D to reduce the rate of osteoporosis.  Menopause can be associated with physical symptoms and risks. Hormone replacement therapy is available to decrease symptoms and risks. You should talk to your caregiver about whether hormone replacement therapy is right for you.  Use sunscreen. Apply sunscreen liberally and repeatedly throughout the day. You should seek shade when your shadow is shorter than you. Protect yourself by wearing long sleeves, pants, a wide-brimmed hat, and sunglasses year round, whenever you are outdoors.  Notify your caregiver of new moles or changes in moles, especially if there is a change in shape or  color. Also notify your caregiver if a mole is larger than the size of a pencil eraser.  Stay current with your immunizations. Document Released: 01/05/2011 Document Revised: 10/17/2012 Document Reviewed: 05/24/2013 [facility] Patient Information 2015 [place], [place]. This information is not intended to replace advice given to you by your health care provider. Make sure you discuss any questions you have with your health care provider.

## 2014-01-03 ENCOUNTER — Telehealth: Payer: Self-pay | Admitting: Internal Medicine

## 2014-01-03 ENCOUNTER — Other Ambulatory Visit (INDEPENDENT_AMBULATORY_CARE_PROVIDER_SITE_OTHER): Payer: Medicare Other

## 2014-01-03 DIAGNOSIS — M810 Age-related osteoporosis without current pathological fracture: Secondary | ICD-10-CM

## 2014-01-03 DIAGNOSIS — Z0000 Encounter for general adult medical examination without abnormal findings: Secondary | ICD-10-CM

## 2014-01-03 DIAGNOSIS — I4891 Unspecified atrial fibrillation: Secondary | ICD-10-CM

## 2014-01-03 DIAGNOSIS — Z79899 Other long term (current) drug therapy: Secondary | ICD-10-CM

## 2014-01-03 DIAGNOSIS — I482 Chronic atrial fibrillation, unspecified: Secondary | ICD-10-CM

## 2014-01-03 LAB — LIPID PANEL
Cholesterol: 211 mg/dL — ABNORMAL HIGH (ref 0–200)
HDL: 58 mg/dL (ref >39.00)
LDL Cholesterol: 120 mg/dL — ABNORMAL HIGH (ref 0–99)
NONHDL: 153
TRIGLYCERIDES: 166 mg/dL — AB (ref 0.0–149.0)
Total CHOL/HDL Ratio: 4
VLDL: 33.2 mg/dL (ref 0.0–40.0)

## 2014-01-03 LAB — HEPATIC FUNCTION PANEL
ALT: 34 U/L (ref 0–35)
AST: 32 U/L (ref 0–37)
Albumin: 4 g/dL (ref 3.5–5.2)
Alkaline Phosphatase: 56 U/L (ref 39–117)
Bilirubin, Direct: 0.1 mg/dL (ref 0.0–0.3)
TOTAL PROTEIN: 7.5 g/dL (ref 6.0–8.3)
Total Bilirubin: 0.5 mg/dL (ref 0.2–1.2)

## 2014-01-03 LAB — TSH: TSH: 2.24 u[IU]/mL (ref 0.35–4.50)

## 2014-01-03 LAB — VITAMIN D 25 HYDROXY (VIT D DEFICIENCY, FRACTURES): VITD: 19.43 ng/mL

## 2014-01-03 MED ORDER — MOMETASONE FUROATE 0.1 % EX SOLN: Freq: Every day | CUTANEOUS | Status: DC

## 2014-01-03 MED ORDER — MOMETASONE FUROATE 0.1 % EX CREA: 1.0000 "application " | TOPICAL_CREAM | Freq: Every day | CUTANEOUS | Status: DC

## 2014-01-03 NOTE — Telephone Encounter (Signed)
Pt came in stating that she needs a new Rx for Mometasone Furoate Cream. Pt also states she wants to update Pharmacy info so that Rx is [facility] on [address] of [address] (not [facility]). Please update pharmacy choice and contact pt when request is reviewed.

## 2014-01-03 NOTE — Telephone Encounter (Signed)
Thanks - rx done as requested

## 2014-01-03 NOTE — Telephone Encounter (Signed)
Pt came in request for Mometasone Furoate cream for her skin, pt stated Dr. Asa Lente told her to bring this so we can prescribe this to her. Please advise. [facility] on [address]. Please check the pharmacy in system

## 2014-01-03 NOTE — Telephone Encounter (Signed)
Called pt to verify msg pt is wanting the 1% lotion instead of the cream sent [facility]. Inform pt will send...Tracy Vasquez

## 2014-01-04 LAB — DIGOXIN LEVEL: Digoxin Level: 2.5 ng/mL — ABNORMAL HIGH (ref 0.8–2.0)

## 2014-01-04 NOTE — Progress Notes (Signed)
Results called. No questions at this time.

## 2014-01-29 ENCOUNTER — Other Ambulatory Visit: Payer: Self-pay

## 2014-01-29 MED ORDER — RIVAROXABAN 15 MG PO TABS: 15.0000 mg | ORAL_TABLET | Freq: Every day | ORAL | Status: DC

## 2014-02-21 ENCOUNTER — Telehealth: Payer: Self-pay | Admitting: Internal Medicine

## 2014-02-21 MED ORDER — ALENDRONATE SODIUM 70 MG PO TABS: ORAL_TABLET | ORAL | Status: DC

## 2014-02-21 NOTE — Telephone Encounter (Signed)
Patient is calling to request a refill on her alendronate (FOSAMAX) 70 MG tablet medication. [facility] [address] [facility] is no [facility] using [facility]. Please advise.

## 2014-02-21 NOTE — Telephone Encounter (Signed)
Notified pt [facility].../lmb

## 2014-04-04 ENCOUNTER — Ambulatory Visit (INDEPENDENT_AMBULATORY_CARE_PROVIDER_SITE_OTHER): Payer: Medicare Other | Admitting: *Deleted

## 2014-04-04 DIAGNOSIS — I495 Sick sinus syndrome: Secondary | ICD-10-CM

## 2014-04-04 LAB — MDC_IDC_ENUM_SESS_TYPE_INCLINIC
Brady Statistic RV Percent Paced: 0 %
Implantable Pulse Generator Serial Number: 571172
Lead Channel Impedance Value: 580 Ohm
Lead Channel Pacing Threshold Amplitude: 0.5 V
Lead Channel Pacing Threshold Amplitude: 0.8 V
Lead Channel Pacing Threshold Pulse Width: 0.5 ms
Lead Channel Pacing Threshold Pulse Width: 0.5 ms
Lead Channel Sensing Intrinsic Amplitude: 12 mV
Lead Channel Sensing Intrinsic Amplitude: 4 mV
Lead Channel Setting Pacing Amplitude: 2 V
Lead Channel Setting Sensing Sensitivity: 2.5 mV
MDC IDC MSMT BATTERY REMAINING LONGEVITY: 30 mo
MDC IDC MSMT LEADCHNL RA IMPEDANCE VALUE: 520 Ohm
MDC IDC SET LEADCHNL RV PACING AMPLITUDE: 2.4 V
MDC IDC SET LEADCHNL RV PACING PULSEWIDTH: 0.5 ms
MDC IDC STAT BRADY RA PERCENT PACED: 96 %

## 2014-04-04 NOTE — Progress Notes (Signed)
Pacemaker check in clinic. Normal device function. Thresholds, sensing, impedances consistent with previous measurements. Device programmed to maximize longevity. 24 mode switches the longest 6 minutes, + xarelto.  Total burden 0%.  No high ventricular rates noted. Device programmed at appropriate safety margins. Histogram distribution appropriate for patient activity level. Device programmed to optimize intrinsic conduction. Estimated longevity 2.5 years.  Patient education completed.  ROV 6 months with Dr. Lovena Le.

## 2014-04-09 ENCOUNTER — Encounter: Payer: Self-pay | Admitting: Internal Medicine

## 2014-04-09 ENCOUNTER — Ambulatory Visit (INDEPENDENT_AMBULATORY_CARE_PROVIDER_SITE_OTHER): Payer: Medicare Other | Admitting: Internal Medicine

## 2014-04-09 VITALS — BP 118/78 | HR 71 | Temp 97.6°F | Ht 64.0 in | Wt 123.0 lb

## 2014-04-09 DIAGNOSIS — L309 Dermatitis, unspecified: Secondary | ICD-10-CM

## 2014-04-09 DIAGNOSIS — Z23 Encounter for immunization: Secondary | ICD-10-CM

## 2014-04-09 DIAGNOSIS — H6123 Impacted cerumen, bilateral: Secondary | ICD-10-CM

## 2014-04-09 DIAGNOSIS — I482 Chronic atrial fibrillation, unspecified: Secondary | ICD-10-CM

## 2014-04-09 MED ORDER — HYDROCORTISONE 1 % EX OINT: 1.0000 "application " | TOPICAL_OINTMENT | Freq: Two times a day (BID) | CUTANEOUS | Status: DC

## 2014-04-09 NOTE — Progress Notes (Signed)
Pre visit review using our clinic review tool, if applicable. No additional management support is needed unless otherwise documented below in the visit note. 

## 2014-04-09 NOTE — Progress Notes (Signed)
Subjective:    Patient ID: Tracy Vasquez, female    DOB: [birth year], 80 y.o.   MRN: [id number]  HPI  Patient is here for follow up  Reviewed chronic medical issues and interval medical events  Past Medical History  Diagnosis Date  . CVA (cerebral infarction) 2008    no residual deficits  . Atrial fibrillation     chronic anticoag, amio and dig  . Hematoma 08/16/2012    spont bleed on warfarin 08/2012  . Acute gangrenous appendicitis with perforation and peritonitis 11/22/2011  . Osteoporosis   . Anxiety   . Depression     Review of Systems  Constitutional: Negative for fever and fatigue.  HENT: Positive for hearing loss (mild, ?cerumen impaction as reports hx same). Negative for ear discharge, ear pain and tinnitus.   Respiratory: Negative for cough and shortness of breath.   Cardiovascular: Negative for chest pain and leg swelling.  Skin: Positive for rash (B temple x 1 mo, mild itch, improved but not resolved with mosturizer). Negative for color change.       Objective:   Physical Exam  BP 118/78  Pulse 71  Temp(Src) 97.6 F (36.4 C) (Oral)  Ht 5\' 4"  (1.626 m)  Wt 123 lb (55.792 kg)  BMI 21.10 kg/m2  SpO2 93% Wt Readings from Last 3 Encounters:  04/09/14 123 lb (55.792 kg)  01/01/14 121 lb 1.9 oz (54.94 kg)  10/05/13 123 lb (55.792 kg)   Constitutional: She appears well-developed and well-nourished. No distress.  HENT: B cerumen impaction R>L ear - after irrigation, B TMs clear without effusion or erythema -Canals mildly abraded, no ulceration or laceration Neck: Normal range of motion. Neck supple. No JVD present. No thyromegaly present.  Cardiovascular: Normal rate, regular rhythm and normal heart sounds.  No murmur heard. No BLE edema. Pulmonary/Chest: Effort normal and breath sounds normal. No respiratory distress. She has no wheezes.  Skin: B dime sized eczema patch B temple above cheekbones - no vesicles, ulceration or cellulitis Psychiatric: She has a  normal mood and affect. Her behavior is normal. Judgment and thought content normal.   Lab Results  Component Value Date   WBC 8.5 10/27/2013   HGB 12.8 10/27/2013   HCT 38.7 10/27/2013   PLT 217.0 10/27/2013   GLUCOSE 76 10/27/2013   CHOL 211* 01/03/2014   TRIG 166.0* 01/03/2014   HDL 58.00 01/03/2014   LDLCALC 120* 01/03/2014   ALT 34 01/03/2014   AST 32 01/03/2014   NA 139 10/27/2013   K 5.0 10/27/2013   CL 106 10/27/2013   CREATININE 1.4* 10/27/2013   BUN 24* 10/27/2013   CO2 27 10/27/2013   TSH 2.24 01/03/2014   INR 4.56* 08/19/2012    No results found.  Procedure: wax removal Reason: wax impaction Risks and benefits of procedure discussed with the patient who agrees to proceed. Ear(s) irrigated with warm water. Large amount of wax removed. Instrumentation with ear loop was performed to accomplish wax removal. the patient tolerated procedure well.     Assessment & Plan:   Eczema, facial - B upper cheekbone near temple, R>L - mild itch, erythema and scaling recommended OTC hydrocortisone 1% BID x 1 week, then prn - Also continue Mositurizing as needed Denies contact or new exposure No other body affected - Pt will call if worse or unimproved in next few days  B cerumen impaction - irrigation and disimpaction today as noted above -   Problem List Items Addressed This Visit  Atrial fibrillation (Chronic)     Hx tachy-brady s/p ablation and PPM 2008 Also CVA 2008 with transient L HP - no recurrent symptoms On beta-blocker, amio, dig and anticoag -  check LFTs, TSH and Dig level periodically - good levels 12/2013 Follows with cards for same     Other Visit Diagnoses   Facial eczema    -  Primary    Need for prophylactic vaccination and inoculation against influenza        Relevant Orders       Flu vaccine HIGH DOSE PF (Fluzone Tri High dose) (Completed)    Cerumen impaction, bilateral

## 2014-04-09 NOTE — Patient Instructions (Signed)
It was good to see you today.  We have reviewed your prior records including labs and tests today  Your annual flu shot was given and/or updated today.  Your ears have been irrigated of wax today -let us know if continued hearing problems persist for referral to audiologist and hearing testing  Medications reviewed and updated Use over-the-counter hydrocortisone to eczema twice daily, call if unimproved or worse in the next 2 weeks No other medication changes recommended at this time.  Please keep scheduled followup in 3-4 months as planned, call sooner if problems.

## 2014-04-09 NOTE — Assessment & Plan Note (Signed)
Hx tachy-brady s/p ablation and PPM 2008 Also CVA 2008 with transient L HP - no recurrent symptoms On beta-blocker, amio, dig and anticoag -  check LFTs, TSH and Dig level periodically - good levels 12/2013 Follows with cards for same

## 2014-04-20 ENCOUNTER — Encounter: Payer: Self-pay | Admitting: Internal Medicine

## 2014-04-25 ENCOUNTER — Ambulatory Visit (INDEPENDENT_AMBULATORY_CARE_PROVIDER_SITE_OTHER): Payer: Medicare Other | Admitting: *Deleted

## 2014-04-25 DIAGNOSIS — I4891 Unspecified atrial fibrillation: Secondary | ICD-10-CM

## 2014-04-25 LAB — BASIC METABOLIC PANEL
BUN: 21 mg/dL (ref 6–23)
CHLORIDE: 106 meq/L (ref 96–112)
CO2: 31 mEq/L (ref 19–32)
Calcium: 9.2 mg/dL (ref 8.4–10.5)
Creatinine, Ser: 1.5 mg/dL — ABNORMAL HIGH (ref 0.4–1.2)
GFR: 35.27 mL/min — AB (ref >60.00)
Glucose, Bld: 86 mg/dL (ref 70–99)
Potassium: 5.1 mEq/L (ref 3.5–5.1)
Sodium: 141 mEq/L (ref 135–145)

## 2014-04-25 LAB — CBC
HCT: 40.4 % (ref 36.0–46.0)
Hemoglobin: 13.1 g/dL (ref 12.0–15.0)
MCHC: 32.4 g/dL (ref 30.0–36.0)
MCV: 94.3 fl (ref 78.0–100.0)
Platelets: 216 10*3/uL (ref 150.0–400.0)
RBC: 4.29 Mil/uL (ref 3.87–5.11)
RDW: 13.8 % (ref 11.5–15.5)
WBC: 9.6 10*3/uL (ref 4.0–10.5)

## 2014-05-01 NOTE — Progress Notes (Signed)
Pt was started on Xarelto for Afib on 08/16/2012.    Reviewed patients medication list.  Pt is not  currently on any combined P-gp and strong CYP3A4 inhibitors/inducers (ketoconazole, traconazole, ritonavir, carbamazepine, phenytoin, rifampin, St. John's wort).  Reviewed labs.  SCr 1.5, Weight 56Kg, CrCl- 30. Pt presently taking Xarelto 15mg s QD with largest meal of the day.   Dose  appropriate based on CrCl.   Hgb and HCT WNL. Pt aware of labs.   A full discussion of the nature of anticoagulants has been carried out.  A benefit/risk analysis has been presented to the patient, so that they understand the justification for choosing anticoagulation with Xarelto at this time.  The need for compliance is stressed.  Pt is aware to take the medication once daily with the largest meal of the day.  Side effects of potential bleeding are discussed, including unusual colored urine or stools, coughing up blood or coffee ground emesis, nose bleeds or serious fall or head trauma.  Discussed signs and symptoms of stroke. The patient should avoid any OTC items containing aspirin or ibuprofen.  Avoid alcohol consumption.   Call if any signs of abnormal bleeding.  Discussed financial obligations and resolved any difficulty in obtaining medication.  Next lab test test in 6 months on 10/29/14.

## 2014-05-28 ENCOUNTER — Other Ambulatory Visit: Payer: Self-pay | Admitting: Internal Medicine

## 2014-07-11 ENCOUNTER — Telehealth: Payer: Self-pay | Admitting: Internal Medicine

## 2014-07-11 NOTE — Telephone Encounter (Signed)
New msg        Pt calling, she will be moving and would like to ask some questions. No details provided.    Please call.

## 2014-07-11 NOTE — Telephone Encounter (Signed)
Spoke with patient and she is moving to [place] with family.  We will fill her medications until she gets settled in [place].  She will have her pharmacy transfer her medications to the new pharmacy.

## 2014-07-17 ENCOUNTER — Other Ambulatory Visit: Payer: Self-pay

## 2014-07-17 ENCOUNTER — Other Ambulatory Visit: Payer: Self-pay | Admitting: *Deleted

## 2014-07-17 MED ORDER — AMIODARONE HCL 200 MG PO TABS: ORAL_TABLET | ORAL | Status: DC

## 2014-07-17 MED ORDER — DIGOXIN 125 MCG PO TABS: ORAL_TABLET | ORAL | Status: DC

## 2014-09-25 ENCOUNTER — Other Ambulatory Visit: Payer: Self-pay | Admitting: Internal Medicine

## 2014-09-26 NOTE — Telephone Encounter (Signed)
Ok to refill? I saw that the patient was moving to [place]. Please advise. Thanks, MI

## 2014-09-26 NOTE — Telephone Encounter (Signed)
Okay to give 3 refills, so patient has enough time to find a new cardiologist.

## 2014-11-05 ENCOUNTER — Other Ambulatory Visit: Payer: Self-pay | Admitting: Internal Medicine

## 2015-05-06 ENCOUNTER — Ambulatory Visit (INDEPENDENT_AMBULATORY_CARE_PROVIDER_SITE_OTHER): Payer: Medicare Other | Admitting: Internal Medicine

## 2015-05-06 ENCOUNTER — Encounter: Payer: Self-pay | Admitting: Internal Medicine

## 2015-05-06 VITALS — BP 128/70 | HR 74 | Ht 63.0 in | Wt 125.8 lb

## 2015-05-06 DIAGNOSIS — I480 Paroxysmal atrial fibrillation: Secondary | ICD-10-CM | POA: Diagnosis not present

## 2015-05-06 DIAGNOSIS — Z950 Presence of cardiac pacemaker: Secondary | ICD-10-CM | POA: Diagnosis not present

## 2015-05-06 MED ORDER — DIGOXIN 125 MCG PO TABS: 0.1250 mg | ORAL_TABLET | Freq: Every day | ORAL | Status: DC

## 2015-05-06 NOTE — Patient Instructions (Addendum)
Medication Instructions:  Your physician recommends that you continue on your current medications as directed. Please refer to the Current Medication list given to you today.   Labwork: None ordered   Testing/Procedures: None ordered   Follow-Up:  Your physician recommends that you schedule a follow-up appointment in: 6 months with the Device Clinic and in 12 months with Dr.Taylor  Any Other Special Instructions Will Be Listed Below (If Applicable).     If you need a refill on your cardiac medications before your next appointment, please call your pharmacy.

## 2015-05-06 NOTE — Assessment & Plan Note (Signed)
She is maintaining NSR. She will likely develop reccurent atrial fib. Will undergo watchful waiting for now. Could consider Tikosyn if she has recurrent, symptomatic fib in the future.

## 2015-05-06 NOTE — Assessment & Plan Note (Signed)
Her [employer] PPM is working normally. Will recheck in several months.

## 2015-05-06 NOTE — Progress Notes (Signed)
HPI Tracy Vasquez returns today for ongoing evaluation and management of symptomatic tachycardia bradycardia syndrome, atrial fibrillation and hypertension. She is status post permanent pacemaker insertion. She has felt well for many years after undergoing permanent pacemaker insertion and initiation of amiodarone therapy. She denies chest pain or shortness of breath. The patient was placed Xarelto after experiencing a hematoma with a supratherapeutic INR. She denies syncope or near-syncope. No peripheral edema. She was visiting her son [place] and had her amiodarone stopped. She feels well. She has not yet had recurrent atrial fib.  No Known Allergies    Current Outpatient Prescriptions  Medication Sig Dispense Refill  . alendronate (FOSAMAX) 70 MG tablet Take 70 mg by mouth once a week. Take with a full glass of water on an empty stomach.    . digoxin (DIGITEK) 0.125 MG tablet Take 0.125 mg by mouth daily.    . metoprolol succinate (TOPROL-XL) 25 MG 24 hr tablet TAKE 1 TABLET BY MOUTH TWICE DAILY 60 tablet 3  . XARELTO 15 MG TABS tablet TAKE 1 TABLET BY MOUTH EVERY DAY 90 tablet 0  . co-enzyme Q-10 50 MG capsule Take 50 mg by mouth daily.     No current facility-administered medications for this visit.     Past Medical History  Diagnosis Date  . CVA (cerebral infarction) 2008    no residual deficits  . Atrial fibrillation (HCC)     chronic anticoag, amio and dig  . Hematoma 08/16/2012    spont bleed on warfarin 08/2012  . Acute gangrenous appendicitis with perforation and peritonitis 11/22/2011  . Osteoporosis   . Anxiety   . Depression     ROS:   All systems reviewed and negative except as noted in the HPI.   Past Surgical History  Procedure Laterality Date  . Pacemaker insertion    . Insert / replace / remove pacemaker    . Laparoscopic appendectomy  11/22/2011    Procedure: APPENDECTOMY LAPAROSCOPIC;  Surgeon: Stark Klein, MD;  Location: [facility] OR;  Service: General;   Laterality: N/A;     Family History  Problem Relation Age of Onset  . Coronary artery disease Mother   . Atrial fibrillation Father      Social History   Social History  . Marital Status: Widowed    Spouse Name: N/A  . Number of Children: N/A  . Years of Education: N/A   Occupational History  . Not on file.   Social History Main Topics  . Smoking status: Never Smoker   . Smokeless tobacco: Never Used  . Alcohol Use: No  . Drug Use: No  . Sexual Activity: Not on file   Other Topics Concern  . Not on file   Social History Narrative   Lives with dtr, moved to [place] from [facility] in 2014, widowed early 2013 (21 years married)     BP 128/70 mmHg  Pulse 74  Ht 5\' 3"  (1.6 m)  Wt 125 lb 12.8 oz (57.063 kg)  BMI 22.29 kg/m2  Physical Exam:  Well appearing elderly woman, NAD HEENT: Unremarkable Neck:  No JVD, no thyromegally Lungs:  Clear with no wheezes, rales, or rhonchi. HEART:  Regular rate rhythm, no murmurs, no rubs, no clicks Abd:  soft, positive bowel sounds, no organomegally, no rebound, no guarding Ext:  2 plus pulses, no edema, no cyanosis, no clubbing, her right arm with modest hematoma under the forearm Skin:  No rashes no nodules Neuro:  CN II through XII intact,  motor grossly intact   DEVICE  Normal device function.  See PaceArt for details.   Assess/Plan:

## 2015-11-04 ENCOUNTER — Encounter: Payer: Self-pay | Admitting: Internal Medicine

## 2015-11-04 ENCOUNTER — Ambulatory Visit (INDEPENDENT_AMBULATORY_CARE_PROVIDER_SITE_OTHER): Payer: Medicare Other | Admitting: *Deleted

## 2015-11-04 VITALS — BP 121/54 | HR 95 | Resp 16

## 2015-11-04 DIAGNOSIS — Z950 Presence of cardiac pacemaker: Secondary | ICD-10-CM

## 2015-11-04 LAB — CUP PACEART INCLINIC DEVICE CHECK
Brady Statistic RA Percent Paced: 9 %
Brady Statistic RV Percent Paced: 11 %
Date Time Interrogation Session: 20170501040000
Implantable Lead Implant Date: 20080827
Implantable Lead Implant Date: 20080827
Implantable Lead Location: 753859
Implantable Lead Location: 753860
Implantable Lead Model: 4135
Implantable Lead Model: 4136
Lead Channel Impedance Value: 490 Ohm
Lead Channel Impedance Value: 580 Ohm
Lead Channel Pacing Threshold Amplitude: 0.6 V
Lead Channel Sensing Intrinsic Amplitude: 1.5 mV
Lead Channel Setting Pacing Amplitude: 2 V
Lead Channel Setting Pacing Amplitude: 2.4 V
Lead Channel Setting Pacing Pulse Width: 0.5 ms
MDC IDC LEAD SERIAL: 28356762
MDC IDC LEAD SERIAL: 28365470
MDC IDC MSMT LEADCHNL RV PACING THRESHOLD PULSEWIDTH: 0.5 ms
MDC IDC MSMT LEADCHNL RV SENSING INTR AMPL: 12 mV — AB
MDC IDC SET LEADCHNL RV SENSING SENSITIVITY: 2.5 mV
Pulse Gen Serial Number: 571172

## 2015-11-04 NOTE — Progress Notes (Signed)
Pacemaker check in clinic. Normal device function. Thresholds, sensing, impedances consistent with previous measurements. Device programmed to maximize longevity. 241 mode switches (67% burden), AF +Xarelto. No high ventricular rates noted. Device programmed at appropriate safety margins; reprogrammed RA sensitivity from 0.40mV to 0.11mV for better detection of AF. Histogram distribution suggests right-shift, +digoxin and metoprolol. Device programmed to optimize intrinsic conduction. Estimated longevity 1.5 years. Patient education completed. Patient reports fatigue, correlates this with forgetting to take meds on schedule, agreeable to appointment with GT in 3 months (rather than 6 months), encouraged compliance with meds. ROV with GT on 02/18/16.

## 2015-12-04 ENCOUNTER — Other Ambulatory Visit: Payer: Self-pay | Admitting: Internal Medicine

## 2015-12-05 ENCOUNTER — Telehealth: Payer: Self-pay | Admitting: *Deleted

## 2015-12-05 NOTE — Telephone Encounter (Signed)
Patient called and would like to know if Dr Lovena Le would be willing to refill the fosamax for her until she can establish with a new pcp. Please advise. Thanks, MI

## 2015-12-05 NOTE — Telephone Encounter (Signed)
No.  She needs to try and get it from whomever she has been getting from and then establish.

## 2015-12-05 NOTE — Telephone Encounter (Signed)
Patient aware. She asked me if I could suggest a pcp for her and I made her aware that I am not able to make any suggestions.

## 2015-12-27 ENCOUNTER — Other Ambulatory Visit: Payer: Self-pay | Admitting: *Deleted

## 2015-12-27 MED ORDER — METOPROLOL SUCCINATE ER 25 MG PO TB24: 25.0000 mg | ORAL_TABLET | Freq: Two times a day (BID) | ORAL | Status: DC

## 2016-01-06 DIAGNOSIS — G451 Carotid artery syndrome (hemispheric): Secondary | ICD-10-CM | POA: Insufficient documentation

## 2016-01-06 DIAGNOSIS — M810 Age-related osteoporosis without current pathological fracture: Secondary | ICD-10-CM | POA: Insufficient documentation

## 2016-01-17 ENCOUNTER — Other Ambulatory Visit: Payer: Self-pay | Admitting: Internal Medicine

## 2016-02-18 ENCOUNTER — Encounter: Payer: Medicare Other | Admitting: Internal Medicine

## 2016-04-07 ENCOUNTER — Ambulatory Visit (INDEPENDENT_AMBULATORY_CARE_PROVIDER_SITE_OTHER): Payer: Medicare Other | Admitting: Internal Medicine

## 2016-04-07 ENCOUNTER — Encounter: Payer: Self-pay | Admitting: Internal Medicine

## 2016-04-07 ENCOUNTER — Encounter (INDEPENDENT_AMBULATORY_CARE_PROVIDER_SITE_OTHER): Payer: Self-pay

## 2016-04-07 VITALS — BP 142/90 | HR 99 | Ht 63.0 in | Wt 125.4 lb

## 2016-04-07 DIAGNOSIS — I480 Paroxysmal atrial fibrillation: Secondary | ICD-10-CM

## 2016-04-07 DIAGNOSIS — Z950 Presence of cardiac pacemaker: Secondary | ICD-10-CM | POA: Diagnosis not present

## 2016-04-07 LAB — CUP PACEART INCLINIC DEVICE CHECK
Brady Statistic RA Percent Paced: 0 %
Brady Statistic RV Percent Paced: 8 %
Date Time Interrogation Session: 20171003040000
Implantable Lead Implant Date: 20080827
Implantable Lead Location: 753859
Implantable Lead Model: 4135
Implantable Lead Serial Number: 28365470
Lead Channel Impedance Value: 490 Ohm
Lead Channel Setting Pacing Amplitude: 2.4 V
Lead Channel Setting Pacing Pulse Width: 0.5 ms
Lead Channel Setting Sensing Sensitivity: 2.5 mV
MDC IDC LEAD IMPLANT DT: 20080827
MDC IDC LEAD LOCATION: 753860
MDC IDC LEAD MODEL: 4136
MDC IDC LEAD SERIAL: 28356762
MDC IDC MSMT LEADCHNL RV IMPEDANCE VALUE: 580 Ohm
MDC IDC MSMT LEADCHNL RV PACING THRESHOLD AMPLITUDE: 0.8 V
MDC IDC MSMT LEADCHNL RV PACING THRESHOLD PULSEWIDTH: 0.5 ms
MDC IDC MSMT LEADCHNL RV SENSING INTR AMPL: 12 mV — AB
Pulse Gen Serial Number: 571172

## 2016-04-07 MED ORDER — METOPROLOL TARTRATE 50 MG PO TABS: 50.0000 mg | ORAL_TABLET | Freq: Two times a day (BID) | ORAL | 3 refills | Status: DC

## 2016-04-07 NOTE — Patient Instructions (Signed)
Medication Instructions:  Your physician has recommended you make the following change in your medication:  1) Stop Metoprolol Succ 2) Start Metoprolol Tartrate 50 mg twice daily    Labwork: None ordered   Testing/Procedures: None ordered   Follow-Up: Your physician wants you to follow-up in:6 months in the device clinic and 12 months with Dr Knox Saliva will receive a reminder letter in the mail two months in advance. If you don't receive a letter, please call our office to schedule the follow-up appointment.   Any Other Special Instructions Will Be Listed Below (If Applicable).     If you need a refill on your cardiac medications before your next appointment, please call your pharmacy.

## 2016-04-07 NOTE — Progress Notes (Signed)
HPI Tracy Vasquez returns today for ongoing evaluation and management of symptomatic tachy- brady syndrome, atrial fibrillation and hypertension. She is status post permanent pacemaker insertion. She has felt well for many years after undergoing permanent pacemaker insertion and initiation of amiodarone therapy. She denies chest pain or shortness of breath. The patient was placed Xarelto after experiencing a hematoma with a supratherapeutic INR. She denies syncope or near-syncope. No peripheral edema.  She feels well. She has gone back to atrial fib. Her blood pressure and her HR are up a bit. She denies medical non-compliance. No Known Allergies    Current Outpatient Prescriptions  Medication Sig Dispense Refill  . alendronate (FOSAMAX) 70 MG tablet Take 70 mg by mouth once a week. Take with a full glass of water on an empty stomach.    . digoxin (DIGITEK) 0.125 MG tablet Take 1 tablet (0.125 mg total) by mouth daily. 90 tablet 3  . XARELTO 15 MG TABS tablet TAKE 1 TABLET BY MOUTH ONCE DAILY WITH DINNER 30 tablet 2  . metoprolol (LOPRESSOR) 50 MG tablet Take 1 tablet (50 mg total) by mouth 2 (two) times daily. 180 tablet 3   No current facility-administered medications for this visit.      Past Medical History:  Diagnosis Date  . Acute gangrenous appendicitis with perforation and peritonitis 11/22/2011  . Anxiety   . Atrial fibrillation (HCC)    chronic anticoag, amio and dig  . CVA (cerebral infarction) 2008   no residual deficits  . Depression   . Hematoma 08/16/2012   spont bleed on warfarin 08/2012  . Osteoporosis     ROS:   All systems reviewed and negative except as noted in the HPI.   Past Surgical History:  Procedure Laterality Date  . INSERT / REPLACE / REMOVE PACEMAKER    . LAPAROSCOPIC APPENDECTOMY  11/22/2011   Procedure: APPENDECTOMY LAPAROSCOPIC;  Surgeon: Stark Klein, MD;  Location: [facility] OR;  Service: General;  Laterality: N/A;  . PACEMAKER INSERTION       Family  History  Problem Relation Age of Onset  . Coronary artery disease Mother   . Atrial fibrillation Father      Social History   Social History  . Marital status: Widowed    Spouse name: N/A  . Number of children: N/A  . Years of education: N/A   Occupational History  . Not on file.   Social History Main Topics  . Smoking status: Never Smoker  . Smokeless tobacco: Never Used  . Alcohol use No  . Drug use: No  . Sexual activity: Not Currently   Other Topics Concern  . Not on file   Social History Narrative   Lives with dtr, moved to [place] from [facility] in 2014, widowed early 2013 (60 years married)     BP (!) 142/90   Pulse 99   Ht 5\' 3"  (1.6 m)   Wt 125 lb 6.4 oz (56.9 kg)   SpO2 90%   BMI 22.21 kg/m   Physical Exam:  Well appearing elderly woman, NAD HEENT: Unremarkable Neck:  No JVD, no thyromegally Lungs:  Clear with no wheezes, rales, or rhonchi. HEART:  Regular rate rhythm, no murmurs, no rubs, no clicks Abd:  soft, positive bowel sounds, no organomegally, no rebound, no guarding Ext:  2 plus pulses, no edema, no cyanosis, no clubbing, her right arm with modest hematoma under the forearm Skin:  No rashes no nodules Neuro:  CN II through XII intact, motor  grossly intact   DEVICE  Normal device function.  See PaceArt for details.   Assess/Plan: 1. Chronic atrial fib - her ventricular rates are up some today. I have asked her to increase her metoprolol. She will continue anti-coag. 2. PPM - her device is working normally except for minimal undersensing of atrial fib. We have reprogrammed her to VVIR. Will recheck in several months. 3. HTN - her blood pressure is up. Will increase the dose of her metoprolol. 4. Stroke - she is tolerating her Xarelto nicely. No residual deficits.  Mikle Bosworth.D.

## 2016-04-13 ENCOUNTER — Other Ambulatory Visit: Payer: Self-pay | Admitting: Internal Medicine

## 2016-04-22 ENCOUNTER — Encounter: Payer: Medicare Other | Admitting: Internal Medicine

## 2016-06-25 ENCOUNTER — Other Ambulatory Visit: Payer: Self-pay | Admitting: Internal Medicine

## 2016-06-25 DIAGNOSIS — I480 Paroxysmal atrial fibrillation: Secondary | ICD-10-CM

## 2016-09-03 ENCOUNTER — Other Ambulatory Visit: Payer: Self-pay | Admitting: Internal Medicine

## 2016-09-03 ENCOUNTER — Other Ambulatory Visit: Payer: Self-pay | Admitting: *Deleted

## 2016-09-03 DIAGNOSIS — I480 Paroxysmal atrial fibrillation: Secondary | ICD-10-CM

## 2016-09-03 MED ORDER — METOPROLOL TARTRATE 50 MG PO TABS: 50.0000 mg | ORAL_TABLET | Freq: Two times a day (BID) | ORAL | 2 refills | Status: DC

## 2016-09-03 MED ORDER — DIGOXIN 125 MCG PO TABS: 125.0000 ug | ORAL_TABLET | Freq: Every day | ORAL | 2 refills | Status: DC

## 2016-09-07 ENCOUNTER — Encounter (INDEPENDENT_AMBULATORY_CARE_PROVIDER_SITE_OTHER): Payer: Self-pay

## 2016-09-07 ENCOUNTER — Other Ambulatory Visit: Payer: Medicare PPO | Admitting: *Deleted

## 2016-09-07 DIAGNOSIS — I480 Paroxysmal atrial fibrillation: Secondary | ICD-10-CM

## 2016-09-08 ENCOUNTER — Other Ambulatory Visit: Payer: Self-pay | Admitting: *Deleted

## 2016-09-08 LAB — CBC
Hematocrit: 41.9 % (ref 34.0–46.6)
Hemoglobin: 13.6 g/dL (ref 11.1–15.9)
MCH: 29.8 pg (ref 26.6–33.0)
MCHC: 32.5 g/dL (ref 31.5–35.7)
MCV: 92 fL (ref 79–97)
PLATELETS: 181 10*3/uL (ref 150–379)
RBC: 4.56 x10E6/uL (ref 3.77–5.28)
RDW: 14.3 % (ref 12.3–15.4)
WBC: 8.4 10*3/uL (ref 3.4–10.8)

## 2016-09-08 LAB — BASIC METABOLIC PANEL
BUN / CREAT RATIO: 22 (ref 12–28)
BUN: 27 mg/dL (ref 8–27)
CHLORIDE: 101 mmol/L (ref 96–106)
CO2: 24 mmol/L (ref 18–29)
Calcium: 9.7 mg/dL (ref 8.7–10.3)
Creatinine, Ser: 1.25 mg/dL — ABNORMAL HIGH (ref 0.57–1.00)
GFR calc Af Amer: 45 mL/min/{1.73_m2} — ABNORMAL LOW (ref >59)
GFR calc non Af Amer: 39 mL/min/{1.73_m2} — ABNORMAL LOW (ref >59)
GLUCOSE: 83 mg/dL (ref 65–99)
Potassium: 5 mmol/L (ref 3.5–5.2)
SODIUM: 142 mmol/L (ref 134–144)

## 2016-09-08 MED ORDER — RIVAROXABAN 15 MG PO TABS: 15.0000 mg | ORAL_TABLET | Freq: Every day | ORAL | 1 refills | Status: DC

## 2016-09-08 NOTE — Telephone Encounter (Signed)
Age 83  Wt 56.9kg (04/07/2016) Saw Dr Lovena Le on 04/07/2016  09/07/2016 Hgb 13.6 HCT41.9 09/07/2016 SrCr 1.25 CrCl 29.55 Refill done for Xarelto 15mg  daily sent to Mail order Pharm[facility] and refill cancelled at [facility] notified

## 2016-10-08 ENCOUNTER — Ambulatory Visit (INDEPENDENT_AMBULATORY_CARE_PROVIDER_SITE_OTHER): Payer: Medicare PPO | Admitting: *Deleted

## 2016-10-08 DIAGNOSIS — I480 Paroxysmal atrial fibrillation: Secondary | ICD-10-CM | POA: Diagnosis not present

## 2016-10-08 LAB — CUP PACEART INCLINIC DEVICE CHECK
Brady Statistic RA Percent Paced: 0 %
Brady Statistic RV Percent Paced: 20 %
Date Time Interrogation Session: 20180405040000
Implantable Lead Implant Date: 20080827
Implantable Lead Location: 753859
Implantable Lead Location: 753860
Implantable Lead Serial Number: 28356762
Implantable Lead Serial Number: 28365470
Lead Channel Pacing Threshold Amplitude: 0.4 V
Lead Channel Pacing Threshold Pulse Width: 0.5 ms
Lead Channel Setting Pacing Amplitude: 2.4 V
Lead Channel Setting Pacing Pulse Width: 0.5 ms
Lead Channel Setting Sensing Sensitivity: 2.5 mV
MDC IDC LEAD IMPLANT DT: 20080827
MDC IDC MSMT LEADCHNL RV IMPEDANCE VALUE: 540 Ohm
MDC IDC MSMT LEADCHNL RV SENSING INTR AMPL: 12 mV — AB
MDC IDC PG IMPLANT DT: 20080827
Pulse Gen Serial Number: 571172

## 2016-10-08 NOTE — Progress Notes (Signed)
Pacemaker check in clinic. Normal device function. Threshold, sensing, impedance consistent with previous measurements. Device programmed to maximize longevity. 1 high ventricular rates noted, ECG appears variable R-R intervals-- possible AFib RVR, Max V rate 220. Device programmed at appropriate safety margins. Histogram distribution appropriate for patient activity level. Device programmed to optimize intrinsic conduction. Estimated longevity 1 year. Patient education completed. ROV with GT in 6 months.

## 2016-12-31 DIAGNOSIS — R600 Localized edema: Secondary | ICD-10-CM | POA: Insufficient documentation

## 2016-12-31 DIAGNOSIS — I8393 Asymptomatic varicose veins of bilateral lower extremities: Secondary | ICD-10-CM | POA: Insufficient documentation

## 2017-01-04 DIAGNOSIS — G4700 Insomnia, unspecified: Secondary | ICD-10-CM | POA: Insufficient documentation

## 2017-01-06 DIAGNOSIS — N184 Chronic kidney disease, stage 4 (severe): Secondary | ICD-10-CM | POA: Insufficient documentation

## 2017-01-11 ENCOUNTER — Telehealth: Payer: Self-pay | Admitting: Internal Medicine

## 2017-01-11 DIAGNOSIS — I482 Chronic atrial fibrillation, unspecified: Secondary | ICD-10-CM

## 2017-01-11 NOTE — Telephone Encounter (Signed)
New message       Pt c/o medication issue:  1. Name of Medication: digoxin  2. How are you currently taking this medication (dosage and times per day)? 0.125mg  daily 3. Are you having a reaction (difficulty breathing--STAT)? no 4. What is your medication issue? Pt saw her pcp last week because of trouble sleeping and ankle swelling.  He told her to stop taking the digoxin and he want her to have an echo.  Pt is in a panic about stopping her digoxin and did not do that until she gets the ok from dr taylor.  Please call and let her know what to do

## 2017-01-11 NOTE — Telephone Encounter (Signed)
Called, spoke with pt. Informed pt Dr. Lovena Le stated it is okay to stop Digoxin. Okay to schedule pt for echo here. Placed order for echo and informed someone will contact to schedule pt for echo and recall appt after echo complete. Pt verbalized understanding.

## 2017-01-19 ENCOUNTER — Encounter: Payer: Self-pay | Admitting: *Deleted

## 2017-01-21 ENCOUNTER — Other Ambulatory Visit: Payer: Self-pay | Admitting: Internal Medicine

## 2017-01-21 NOTE — Telephone Encounter (Signed)
Age 83 years Wt 56.9kg 04/07/2016 Saw Dr Lovena Le on 04/07/2016  09/07/2016 Hgb 13.6 HCT 41.9   SrCr 1.25 CrCl 29.55  Refill done for Xarelto 15mg  daily

## 2017-01-22 ENCOUNTER — Ambulatory Visit (HOSPITAL_COMMUNITY): Payer: Medicare PPO | Attending: Cardiovascular Disease

## 2017-01-22 ENCOUNTER — Other Ambulatory Visit: Payer: Self-pay

## 2017-01-22 DIAGNOSIS — I482 Chronic atrial fibrillation, unspecified: Secondary | ICD-10-CM

## 2017-01-22 DIAGNOSIS — I083 Combined rheumatic disorders of mitral, aortic and tricuspid valves: Secondary | ICD-10-CM | POA: Insufficient documentation

## 2017-01-22 DIAGNOSIS — Z950 Presence of cardiac pacemaker: Secondary | ICD-10-CM | POA: Diagnosis not present

## 2017-01-27 ENCOUNTER — Telehealth: Payer: Self-pay | Admitting: Internal Medicine

## 2017-01-27 ENCOUNTER — Other Ambulatory Visit: Payer: Self-pay

## 2017-01-27 ENCOUNTER — Inpatient Hospital Stay (HOSPITAL_COMMUNITY)
Admission: EM | Admit: 2017-01-27 | Discharge: 2017-02-03 | DRG: 286 | Disposition: A | Discharge status: Home / Self Care | Payer: Medicare PPO | Attending: Internal Medicine | Admitting: Internal Medicine

## 2017-01-27 ENCOUNTER — Encounter (HOSPITAL_COMMUNITY): Payer: Self-pay | Admitting: *Deleted

## 2017-01-27 ENCOUNTER — Emergency Department (HOSPITAL_COMMUNITY): Payer: Medicare PPO

## 2017-01-27 DIAGNOSIS — I5022 Chronic systolic (congestive) heart failure: Secondary | ICD-10-CM | POA: Diagnosis present

## 2017-01-27 DIAGNOSIS — Z8673 Personal history of transient ischemic attack (TIA), and cerebral infarction without residual deficits: Secondary | ICD-10-CM | POA: Diagnosis not present

## 2017-01-27 DIAGNOSIS — Z7901 Long term (current) use of anticoagulants: Secondary | ICD-10-CM

## 2017-01-27 DIAGNOSIS — R0602 Shortness of breath: Secondary | ICD-10-CM | POA: Diagnosis not present

## 2017-01-27 DIAGNOSIS — J449 Chronic obstructive pulmonary disease, unspecified: Secondary | ICD-10-CM | POA: Diagnosis present

## 2017-01-27 DIAGNOSIS — I248 Other forms of acute ischemic heart disease: Secondary | ICD-10-CM | POA: Diagnosis not present

## 2017-01-27 DIAGNOSIS — F419 Anxiety disorder, unspecified: Secondary | ICD-10-CM | POA: Diagnosis present

## 2017-01-27 DIAGNOSIS — I482 Chronic atrial fibrillation: Secondary | ICD-10-CM | POA: Diagnosis not present

## 2017-01-27 DIAGNOSIS — I420 Dilated cardiomyopathy: Secondary | ICD-10-CM | POA: Diagnosis not present

## 2017-01-27 DIAGNOSIS — Z950 Presence of cardiac pacemaker: Secondary | ICD-10-CM

## 2017-01-27 DIAGNOSIS — I428 Other cardiomyopathies: Secondary | ICD-10-CM | POA: Diagnosis not present

## 2017-01-27 DIAGNOSIS — M810 Age-related osteoporosis without current pathological fracture: Secondary | ICD-10-CM | POA: Diagnosis not present

## 2017-01-27 DIAGNOSIS — I5023 Acute on chronic systolic (congestive) heart failure: Secondary | ICD-10-CM | POA: Diagnosis present

## 2017-01-27 DIAGNOSIS — I700 Atherosclerosis of aorta: Secondary | ICD-10-CM | POA: Diagnosis not present

## 2017-01-27 DIAGNOSIS — N184 Chronic kidney disease, stage 4 (severe): Secondary | ICD-10-CM | POA: Diagnosis not present

## 2017-01-27 DIAGNOSIS — I4891 Unspecified atrial fibrillation: Secondary | ICD-10-CM

## 2017-01-27 DIAGNOSIS — Z79899 Other long term (current) drug therapy: Secondary | ICD-10-CM

## 2017-01-27 DIAGNOSIS — I340 Nonrheumatic mitral (valve) insufficiency: Secondary | ICD-10-CM | POA: Diagnosis present

## 2017-01-27 DIAGNOSIS — G4700 Insomnia, unspecified: Secondary | ICD-10-CM | POA: Diagnosis present

## 2017-01-27 DIAGNOSIS — I447 Left bundle-branch block, unspecified: Secondary | ICD-10-CM | POA: Diagnosis not present

## 2017-01-27 LAB — CBC
HEMATOCRIT: 42.3 % (ref 36.0–46.0)
HEMOGLOBIN: 13.8 g/dL (ref 12.0–15.0)
MCH: 30.5 pg (ref 26.0–34.0)
MCHC: 32.6 g/dL (ref 30.0–36.0)
MCV: 93.6 fL (ref 78.0–100.0)
Platelets: 189 10*3/uL (ref 150–400)
RBC: 4.52 MIL/uL (ref 3.87–5.11)
RDW: 12.8 % (ref 11.5–15.5)
WBC: 10.7 10*3/uL — AB (ref 4.0–10.5)

## 2017-01-27 LAB — BASIC METABOLIC PANEL
Anion gap: 9 (ref 5–15)
BUN: 23 mg/dL — ABNORMAL HIGH (ref 6–20)
CHLORIDE: 106 mmol/L (ref 101–111)
CO2: 24 mmol/L (ref 22–32)
Calcium: 9.6 mg/dL (ref 8.9–10.3)
Creatinine, Ser: 1.23 mg/dL — ABNORMAL HIGH (ref 0.44–1.00)
GFR calc non Af Amer: 39 mL/min — ABNORMAL LOW (ref >60)
GFR, EST AFRICAN AMERICAN: 45 mL/min — AB (ref >60)
Glucose, Bld: 87 mg/dL (ref 65–99)
POTASSIUM: 4.6 mmol/L (ref 3.5–5.1)
SODIUM: 139 mmol/L (ref 135–145)

## 2017-01-27 LAB — I-STAT TROPONIN, ED: Troponin i, poc: 0 ng/mL (ref 0.00–0.08)

## 2017-01-27 MED ORDER — DIGOXIN 125 MCG PO TABS
125.0000 ug | ORAL_TABLET | Freq: Every day | ORAL | Status: DC
  Administered 2017-01-28: 125 ug via ORAL
  Filled 2017-01-27: qty 1

## 2017-01-27 MED ORDER — DILTIAZEM LOAD VIA INFUSION
15.0000 mg | Freq: Once | INTRAVENOUS | Status: AC
  Administered 2017-01-27: 15 mg via INTRAVENOUS
  Filled 2017-01-27: qty 15

## 2017-01-27 MED ORDER — RIVAROXABAN 15 MG PO TABS
15.0000 mg | ORAL_TABLET | Freq: Every day | ORAL | Status: DC
  Administered 2017-01-27: 15 mg via ORAL
  Filled 2017-01-27: qty 1

## 2017-01-27 MED ORDER — RIVAROXABAN 15 MG PO TABS: 15.0000 mg | ORAL_TABLET | Freq: Every day | ORAL | Status: DC

## 2017-01-27 MED ORDER — ACETAMINOPHEN 325 MG PO TABS: 650.0000 mg | ORAL_TABLET | ORAL | Status: DC | PRN

## 2017-01-27 MED ORDER — METOPROLOL TARTRATE 50 MG PO TABS
50.0000 mg | ORAL_TABLET | Freq: Two times a day (BID) | ORAL | Status: DC
  Administered 2017-01-27 – 2017-01-28 (×2): 50 mg via ORAL
  Filled 2017-01-27 (×2): qty 1

## 2017-01-27 MED ORDER — DILTIAZEM HCL 100 MG IV SOLR
5.0000 mg/h | INTRAVENOUS | Status: DC
  Administered 2017-01-27: 5 mg/h via INTRAVENOUS
  Filled 2017-01-27: qty 100

## 2017-01-27 MED ORDER — ALENDRONATE SODIUM 70 MG PO TABS: 70.0000 mg | ORAL_TABLET | ORAL | Status: DC

## 2017-01-27 MED ORDER — DILTIAZEM HCL 100 MG IV SOLR
5.0000 mg/h | INTRAVENOUS | Status: DC
  Administered 2017-01-28: 15 mg/h via INTRAVENOUS
  Filled 2017-01-27 (×2): qty 100

## 2017-01-27 MED ORDER — ONDANSETRON HCL 4 MG/2ML IJ SOLN: 4.0000 mg | Freq: Four times a day (QID) | INTRAMUSCULAR | Status: DC | PRN

## 2017-01-27 NOTE — Telephone Encounter (Signed)
New Message   pt daughter verbalized that she is returning call for rn  Because she wants to take pt to ER and want her mother to not have to wait and if rn can call ahead

## 2017-01-27 NOTE — H&P (Signed)
History and Physical   Admit date: 01/27/2017 Name:  Tracy Vasquez Medical record number: [id number] DOB/Age:  [birth year]  83 y.o. female  Referring Physician:   [facility]   Primary Cardiologist:  Lovena Le  Primary Physician:   Garlon Hatchet  Chief complaint/reason for admission: Shortness of breath and atrial fibrillation  HPI:  His 83 year old female has a history of atrial fibrillation as well as tachycardia-bradycardia syndrome.  She had a pacemaker placed in [place] a [place] scientific device and number of years ago.  She was maintained on amiodarone and metoprolol and did fairly well with that and then moved appear to be closer to her daughters.  Dr. Lovena Le continued her on her therapy and her amiodarone was stopped in 2016 by physician in [place].  She appeared to develop more atrial fibrillation following that but was doing fairly well and recently saw her primary doctor who noted some edema and ordered blood testing showing mild elevation of her BNP level.  An echocardiogram was ordered and was done 5 days ago showing an ejection fraction of 30-35% with marked left atrial enlargement and moderate to severe mitral regurgitation.  There was reported akinesis of the mid apical anteroseptal and apical myocardium.  A digoxin level returned upper limits of normal but she was reported as having stage IV chronic kidney disease and her primary doctor stopped her digoxin a few days ago.  She developed increasing shortness of breath pounding heart rate and became anxious and was supposed to see Dr. Lovena Le or his PA tomorrow but became concerned she would not make it through the night because of feeling poorly and shortness of breath and came to the emergency room tonight.  She was found to be in rapid atrial fibrillation and is currently in no acute distress and her rate is somewhat slower now.  She has no ischemic chest pain.   He hasn't been having much edema recently.  She denies PND  orthopnea or claudication.  Past Medical History:  Diagnosis Date  . Acute gangrenous appendicitis with perforation and peritonitis 11/22/2011  . Anxiety   . Atrial fibrillation (HCC)    chronic anticoag, amio and dig  . CVA (cerebral infarction) 2008   no residual deficits  . Depression   . Hematoma 08/16/2012   spont bleed on warfarin 08/2012  . Osteoporosis        Past Surgical History:  Procedure Laterality Date  . INSERT / REPLACE / REMOVE PACEMAKER    . LAPAROSCOPIC APPENDECTOMY  11/22/2011   Procedure: APPENDECTOMY LAPAROSCOPIC;  Surgeon: Stark Klein, MD;  Location: [facility];  Service: General;  Laterality: N/A;  . PACEMAKER INSERTION    .  Allergies: has No Known Allergies.   Medications: Prior to Admission medications   Medication Sig Start Date End Date Taking? Authorizing Provider  alendronate (FOSAMAX) 70 MG tablet Take 70 mg by mouth once a week. Take with a full glass of water on an empty stomach.    [provider]  metoprolol (LOPRESSOR) 50 MG tablet Take 1 tablet (50 mg total) by mouth 2 (two) times daily. 09/03/16 12/02/16  Evans Lance, MD  XARELTO 15 MG TABS tablet TAKE 1 TABLET EVERY DAY  WITH  SUPPER 01/21/17   Evans Lance, MD   Family History:  Family Status  Relation Status  . Mother Deceased at age 51  . Father Deceased at age 100  . MGM Deceased  . MGF Deceased  . PGM Deceased  .  PGF Deceased    Social History:   reports that she has never smoked. She has never used smokeless tobacco. She reports that she does not drink alcohol or use drugs.   Social History   Social History Narrative   Lives with dtr, moved to [place] from [place] in [place], widowed early 2013 (64 years married)     Review of Systems: She complains of insomnia, occasional edema.  She normally is active and still drives is not limited with shortness of breath.  She has no significant bleeding. Other than as noted above, the remainder of the review of systems is  normal  Physical Exam: BP (!) 110/98   Pulse 83   Temp 98.3 F (36.8 C) (Oral)   Resp 20   SpO2 91%   General appearance: She is a pleasant elderly female appearing younger than stated age Head: Normocephalic, without obvious abnormality, atraumatic, Face is somewhat flushed Eyes: conjunctivae/corneas clear. PERRL, EOM's intact. Fundi not examined Neck: no adenopathy, no carotid bruit, supple, symmetrical, trachea midline and JVD elevated at 3 cm Lungs: clear to auscultation bilaterally Heart: Rapid irregular rhythm, normal S1 and S2, no S3, 1/6 systolic murmur at the apex Abdomen: soft, non-tender; bowel sounds normal; no masses,  no organomegaly Pelvic: deferred Extremities: Trace edema, skin is somewhat thin and there are bilateral changes of venous insufficiency noted Pulses: 2+ and symmetric Skin: Skin color, texture, turgor normal. No rashes or lesions Neurologic: Grossly normal  Labs: CBC  Recent Labs  01/27/17 1815  WBC 10.7*  RBC 4.52  HGB 13.8  HCT 42.3  PLT 189  MCV 93.6  MCH 30.5  MCHC 32.6  RDW 12.8   CMP   Recent Labs  01/27/17 1815  NA 139  K 4.6  CL 106  CO2 24  GLUCOSE 87  BUN 23*  CREATININE 1.23*  CALCIUM 9.6  GFRNONAA 39*  GFRAA 45*   EKG: Atrial fibrillation with rapid ventricular response, left bundle branch block Independently reviewed by me  Radiology: Aortic atherosclerosis sclerosis, changes consistent with COPD, left atrial enlargement   IMPRESSIONS: 1.  Atrial fibrillation with rapid ventricular response 2.  Left bundle branch block 3.  Acute on chronic systolic heart failure 4.  Severe mitral regurgitation 5.  Long-term use of anticoagulation  PLAN: Intravenous diltiazem overnight to obtain rate control.  I wonder if the cessation of digoxin may have led to rapid atrial fibrillation.  Recent echo suggests LV dysfunction and severe mitral regurgitation one would wonder about the contribution of atrial fibrillation and  left bundle branch block to that.  Previously her amiodarone was discontinued for unclear reasons and appear that she previously had been able to maintain sinus rhythm but has been having more atrial fibrillation recently.  Evaluate BNP level, control atrial fibrillation rate.  Consider despite the atrial enlargement attempts to put her back on amiodarome and maybe maintain rhythm.  May reconsider whether to restart digoxin.  Signed: Kerry Hough MD [facility] Cardiology  01/27/2017, 9:19 PM

## 2017-01-27 NOTE — ED Triage Notes (Signed)
Pt reports feeling sob x 1 week, more severe since yesterday. Unable to sleep due to having "to force herself to take deep breaths." hx of afib. Is afib rvr at triage. Denies swelling to legs. Airway intact at this time, HR 135 at triage.

## 2017-01-27 NOTE — Telephone Encounter (Signed)
Pt states that last night she did not sleep well and was SOB. States she was having to force herself to breathe.  States while vacuuming yesterday she noticed some slight SOB.  Says she has been flushed and felt like her pulse was fluttering when she checked it on her wrist.  No vitals available.  Spoke with Dr. Lovena Le and he said ok to schedule pt to see Tommye Standard, PA-C tomorrow and to go to ER with worsening sx.  Spoke with pt and went over recommendations per Dr. Lovena Le.  Scheduled pt to see Renee tomorrow at 0830.  Pt verbalized understanding and was in agreement with this plan.

## 2017-01-27 NOTE — ED Provider Notes (Signed)
Tracy Vasquez Note   CSN: [id number] Arrival date & time: 01/27/17  1807     History   Chief Complaint Chief Complaint  Patient presents with  . Shortness of Breath  . Atrial Fibrillation    HPI Tracy Vasquez is a 83 y.o. female.  HPI Patient reports that she thinks she went into atrial fibrillation yesterday. She reports she just gets a feeling that is pretty characteristic. It's a combination of being lightheaded, generally fatigued and then noting that her heart rate is elevated. She reports symptoms have persisted through the night and she was having difficulty sleeping due to shortness of breath. She denies she's been experiencing swelling or pain in her lower extremities. She reports she's had some occasional right-sided chest pains that have been brief. No syncopal episode. Patient has been taking her regular medications as prescribed including her Xarelto and metoprolol. She does report the digoxin was stopped about 2 weeks ago. Past Medical History:  Diagnosis Date  . Acute gangrenous appendicitis with perforation and peritonitis 11/22/2011  . Anxiety   . Atrial fibrillation (HCC)    chronic anticoag, amio and dig  . CVA (cerebral infarction) 2008   no residual deficits  . Depression   . Hematoma 08/16/2012   spont bleed on warfarin 08/2012  . Osteoporosis     Patient Active Problem List   Diagnosis Date Noted  . Osteoporosis   . Pacemaker 08/16/2012  . Atrial fibrillation ([place]) 11/22/2011    Past Surgical History:  Procedure Laterality Date  . INSERT / REPLACE / REMOVE PACEMAKER    . LAPAROSCOPIC APPENDECTOMY  11/22/2011   Procedure: APPENDECTOMY LAPAROSCOPIC;  Surgeon: Stark Klein, MD;  Location: [facility];  Service: General;  Laterality: N/A;  . PACEMAKER INSERTION      OB History    No data available       Home Medications    Prior to Admission medications   Medication Sig Start Date End Date Taking? Authorizing Vasquez  alendronate  (FOSAMAX) 70 MG tablet Take 70 mg by mouth once a week. Take with a full glass of water on an empty stomach.    Vasquez, Historical, MD  digoxin (DIGOX) 0.125 MG tablet Take 1 tablet (125 mcg total) by mouth daily. 09/03/16   Evans Lance, MD  metoprolol (LOPRESSOR) 50 MG tablet Take 1 tablet (50 mg total) by mouth 2 (two) times daily. 09/03/16 12/02/16  Evans Lance, MD  XARELTO 15 MG TABS tablet TAKE 1 TABLET EVERY DAY  WITH  SUPPER 01/21/17   Evans Lance, MD    Family History Family History  Problem Relation Age of Onset  . Coronary artery disease Mother   . Atrial fibrillation Father     Social History Social History  Substance Use Topics  . Smoking status: Never Smoker  . Smokeless tobacco: Never Used  . Alcohol use No     Allergies   Patient has no known allergies.   Review of Systems Review of Systems 10 Systems reviewed and are negative for acute change except as noted in the HPI.   Physical Exam Updated Vital Signs BP 129/62 (BP Location: Right Arm)   Pulse (!) 135   Temp 98.3 F (36.8 C) (Oral)   Resp 18   SpO2 97%   Physical Exam  Constitutional: She is oriented to person, place, and time. She appears well-developed and well-nourished. No distress.  HENT:  Head: Normocephalic and atraumatic.  Eyes: Conjunctivae and EOM are normal.  Neck: Neck supple.  Cardiovascular: Intact distal pulses.   Tachycardia, irregularly irregular. Cannot appreciate rub murmur gallop at rate of 130 to 150 on the monitor  Pulmonary/Chest: Effort normal and breath sounds normal. No respiratory distress.  Abdominal: Soft. There is no tenderness.  Musculoskeletal: Normal range of motion. She exhibits no edema or tenderness.  Patient has significant lower extremity varicosities but no peripheral edema, no erythema or extremities in good condition.  Neurological: She is alert and oriented to person, place, and time. No cranial nerve deficit. She exhibits normal muscle tone.  Coordination normal.  Skin: Skin is warm and dry.  Psychiatric: She has a normal mood and affect.  Nursing note and vitals reviewed.    ED Treatments / Results  Labs (all labs ordered are listed, but only abnormal results are displayed) Labs Reviewed  BASIC METABOLIC PANEL - Abnormal; Notable for the following:       Result Value   BUN 23 (*)    Creatinine, Ser 1.23 (*)    GFR calc non Af Amer 39 (*)    GFR calc Af Amer 45 (*)    All other components within normal limits  CBC - Abnormal; Notable for the following:    WBC 10.7 (*)    All other components within normal limits  I-STAT TROPONIN, ED    EKG  EKG Interpretation  Date/Time:  Wednesday January 27 2017 18:16:52 EDT Ventricular Rate:  145 PR Interval:    QRS Duration: 116 QT Interval:  328 QTC Calculation: 509 R Axis:   -124 Text Interpretation:  Atrial fibrillation with rapid ventricular response Low voltage QRS Possible Anterolateral infarct , age undetermined Abnormal ECG Agree. QRS morphology unchanged from previous. Confirmed by Charlesetta Shanks [phone]) on 01/27/2017 7:48:28 PM       Radiology Dg Chest 2 View  Result Date: 01/27/2017 CLINICAL DATA:  83 year old female with shortness of breath EXAM: CHEST  2 VIEW COMPARISON:  None. FINDINGS: Left subclavian approach cardiac rhythm maintenance device. Leads project over the right atrium and right ventricle. Suspect left atrial enlargement. Atherosclerotic calcifications present in the transverse aorta. The lungs are slightly hyperinflated with mild interstitial prominence and central bronchitic changes. IMPRESSION: 1. No active cardiopulmonary process. 2. Left atrial enlargement. 3.  Aortic Atherosclerosis (ICD10-170.0) 4. Pulmonary hyperinflation, interstitial prominence and bronchitic changes suggest underlying COPD. Electronically Signed   By: Jacqulynn Cadet M.D.   On: 01/27/2017 18:56    Procedures Procedures (including critical care time) CRITICAL  CARE Performed by: Charlesetta Shanks   Total critical care time: 30 minutes  Critical care time was exclusive of separately billable procedures and treating other patients.  Critical care was necessary to treat or prevent imminent or life-threatening deterioration.  Critical care was time spent personally by me on the following activities: development of treatment plan with patient and/or surrogate as well as nursing, discussions with consultants, evaluation of patient's response to treatment, examination of patient, obtaining history from patient or surrogate, ordering and performing treatments and interventions, ordering and review of laboratory studies, ordering and review of radiographic studies, pulse oximetry and re-evaluation of patient's condition. Medications Ordered in ED Medications  diltiazem (CARDIZEM) 1 mg/mL load via infusion 15 mg (not administered)    And  diltiazem (CARDIZEM) 100 mg in dextrose 5 % 100 mL (1 mg/mL) infusion (not administered)     Initial Impression / Assessment and Plan / ED Course  I have reviewed the triage vital signs and the nursing notes.  Pertinent labs &  imaging results that were available during my care of the patient were reviewed by me and considered in my medical decision making (see chart for details).    Consult: Cardiology for admission  Final Clinical Impressions(s) / ED Diagnoses   Final diagnoses:  Atrial fibrillation with RVR ([place])   Patient has history of atrial fibrillation and is compliant with Xarelto. Patient is symptomatic with rates 130 to 150. She perceives lightheadedness and has had some intermittent chest pain. Patient's blood pressure is stable. She does not have respiratory distress. We will initiate Cardizem bolus and drip for rate control. New Prescriptions New Prescriptions   No medications on file     Charlesetta Shanks, MD 02/01/17 1121

## 2017-01-27 NOTE — Telephone Encounter (Signed)
Spoke with patient's daughter.  A call was placed earlier for patient c/o SOB, an appointment was made for tomorrow with APP.     Daughter now states patient is SOB, tight, coarse cough and is disoriented.  She is "scared to go through the night".   She is going to bring her to the [facility] ER but they live an hour away.  She is afraid her mother can't tolerate sitting in the ER but the patient is refusing to go to her local ER.  Advised that she develops further distress in the car that she needs to pull over and call EMS.  She verbalizes understanding and agreement.     Will forward to inform Dr. Lovena Le and APP.

## 2017-01-27 NOTE — Telephone Encounter (Signed)
Patient calling, states that she is returning call.

## 2017-01-27 NOTE — Progress Notes (Signed)
Patient transferred to 3east form ED. Patient alert and oriented. Admission assessment completed. CCMD notified. Patient in bed resting. No complaints at this time. Current HR 89 on Cardizem drip at 15.

## 2017-01-28 ENCOUNTER — Ambulatory Visit: Payer: Medicare PPO | Admitting: Physician Assistant

## 2017-01-28 DIAGNOSIS — I4891 Unspecified atrial fibrillation: Secondary | ICD-10-CM

## 2017-01-28 DIAGNOSIS — I340 Nonrheumatic mitral (valve) insufficiency: Secondary | ICD-10-CM

## 2017-01-28 DIAGNOSIS — I5023 Acute on chronic systolic (congestive) heart failure: Secondary | ICD-10-CM

## 2017-01-28 LAB — COMPREHENSIVE METABOLIC PANEL
ALBUMIN: 3.8 g/dL (ref 3.5–5.0)
ALK PHOS: 48 U/L (ref 38–126)
ALT: 46 U/L (ref 14–54)
AST: 44 U/L — AB (ref 15–41)
Anion gap: 8 (ref 5–15)
BILIRUBIN TOTAL: 0.8 mg/dL (ref 0.3–1.2)
BUN: 22 mg/dL — AB (ref 6–20)
CALCIUM: 9.4 mg/dL (ref 8.9–10.3)
CO2: 25 mmol/L (ref 22–32)
CREATININE: 1.22 mg/dL — AB (ref 0.44–1.00)
Chloride: 109 mmol/L (ref 101–111)
GFR calc Af Amer: 45 mL/min — ABNORMAL LOW (ref >60)
GFR, EST NON AFRICAN AMERICAN: 39 mL/min — AB (ref >60)
GLUCOSE: 109 mg/dL — AB (ref 65–99)
Potassium: 5.1 mmol/L (ref 3.5–5.1)
Sodium: 142 mmol/L (ref 135–145)
TOTAL PROTEIN: 7 g/dL (ref 6.5–8.1)

## 2017-01-28 LAB — TSH: TSH: 2.955 u[IU]/mL (ref 0.350–4.500)

## 2017-01-28 LAB — BRAIN NATRIURETIC PEPTIDE: B NATRIURETIC PEPTIDE 5: 541.4 pg/mL — AB (ref 0.0–100.0)

## 2017-01-28 MED ORDER — RIVAROXABAN 15 MG PO TABS
15.0000 mg | ORAL_TABLET | Freq: Every day | ORAL | Status: DC
  Administered 2017-01-28 – 2017-01-29 (×2): 15 mg via ORAL
  Filled 2017-01-28 (×3): qty 1

## 2017-01-28 MED ORDER — FUROSEMIDE 10 MG/ML IJ SOLN
60.0000 mg | Freq: Once | INTRAMUSCULAR | Status: AC
  Administered 2017-01-28: 60 mg via INTRAVENOUS
  Filled 2017-01-28: qty 6

## 2017-01-28 MED ORDER — METOPROLOL TARTRATE 25 MG PO TABS
25.0000 mg | ORAL_TABLET | Freq: Once | ORAL | Status: AC
  Administered 2017-01-28: 25 mg via ORAL
  Filled 2017-01-28: qty 1

## 2017-01-28 MED ORDER — METOPROLOL TARTRATE 50 MG PO TABS
75.0000 mg | ORAL_TABLET | Freq: Two times a day (BID) | ORAL | Status: DC
  Administered 2017-01-28 – 2017-01-31 (×6): 75 mg via ORAL
  Filled 2017-01-28 (×6): qty 1

## 2017-01-28 NOTE — Progress Notes (Signed)
Progress Note  Patient Name: Tracy Vasquez Date of Encounter: 01/28/2017  Primary Cardiologist: Dr. Lovena Le  Subjective   She has been rate controlled and significant improvement of her symptoms of SOB and feeling poorly.  Blood pressure (!) 110/55, pulse 71, temperature 98.4 F (36.9 C), temperature source Oral, resp. rate 15, height 5\' 3"  (1.6 m), weight 126 lb 11.2 oz (57.5 kg), SpO2 95 %.   Inpatient Medications    Scheduled Meds: . digoxin  125 mcg Oral Daily  . metoprolol tartrate  50 mg Oral BID  . Rivaroxaban  15 mg Oral Q supper   Continuous Infusions: . diltiazem (CARDIZEM) infusion 15 mg/hr (01/28/17 0143)   PRN Meds: acetaminophen, ondansetron (ZOFRAN) IV   Vital Signs    Vitals:   01/27/17 2230 01/27/17 2259 01/27/17 2300 01/28/17 0508  BP: 109/70  115/78 (!) 110/55  Pulse: 87  89 71  Resp: 20  15 15   Temp:   98.1 F (36.7 C) 98.4 F (36.9 C)  TempSrc:   Oral Oral  SpO2: 94%  96% 95%  Weight:  128 lb 9.6 oz (58.3 kg)  126 lb 11.2 oz (57.5 kg)  Height:  5\' 3"  (1.6 m)      Intake/Output Summary (Last 24 hours) at 01/28/17 0758 Last data filed at 01/28/17 0511  Gross per 24 hour  Intake              290 ml  Output              200 ml  Net               90 ml   Filed Weights   01/27/17 2259 01/28/17 0508  Weight: 128 lb 9.6 oz (58.3 kg) 126 lb 11.2 oz (57.5 kg)    Telemetry    Sinus rhythm with pisodes of atrial fibrillation, rate controlled- Personally Reviewed   Physical Exam   GEN: Well nourished, well developed Neck: carotid bruits, or masses, Elevated JVP Cardiac: RRR. Gr II/VI systolic murmur apex  No, rubs, or gallops,no edema. Intact distal pulses bilaterally.  Respiratory: Mild rales at bases   GI: soft, nontender, nondistended, + BS MS: no deformity or atrophy  Skin: warm and dry, no rash Neuro: Alert and Oriented x 3, Strength and sensation are intact Psych:   Full affect  Labs    Chemistry Recent Labs Lab  01/27/17 1815 01/27/17 2323  NA 139 142  K 4.6 5.1  CL 106 109  CO2 24 25  GLUCOSE 87 109*  BUN 23* 22*  CREATININE 1.23* 1.22*  CALCIUM 9.6 9.4  PROT  --  7.0  ALBUMIN  --  3.8  AST  --  44*  ALT  --  46  ALKPHOS  --  48  BILITOT  --  0.8  GFRNONAA 39* 39*  GFRAA 45* 45*  ANIONGAP 9 8     Hematology Recent Labs Lab 01/27/17 1815  WBC 10.7*  RBC 4.52  HGB 13.8  HCT 42.3  MCV 93.6  MCH 30.5  MCHC 32.6  RDW 12.8  PLT 189      BNP Recent Labs Lab 01/27/17 2323  BNP 541.4*       Radiology    Dg Chest 2 View  Result Date: 01/27/2017 CLINICAL DATA:  83 year old female with shortness of breath EXAM: CHEST  2 VIEW COMPARISON:  None. FINDINGS: Left subclavian approach cardiac rhythm maintenance device. Leads project over the right atrium and right ventricle.  Suspect left atrial enlargement. Atherosclerotic calcifications present in the transverse aorta. The lungs are slightly hyperinflated with mild interstitial prominence and central bronchitic changes. IMPRESSION: 1. No active cardiopulmonary process. 2. Left atrial enlargement. 3.  Aortic Atherosclerosis (ICD10-170.0) 4. Pulmonary hyperinflation, interstitial prominence and bronchitic changes suggest underlying COPD. Electronically Signed   By: Jacqulynn Cadet M.D.   On: 01/27/2017 18:56    Cardiac Studies   Transthoracic Echocardiogram 01/22/2017 Study Conclusions  - Left ventricle: The cavity size was normal. Wall thickness was   normal. Systolic function was moderately to severely reduced. The   estimated ejection fraction was in the range of 30% to 35%.   Akinesis of the mid-apicalanteroseptal and apical myocardium. - Aortic valve: There was mild regurgitation. - Mitral valve: There was moderate to severe regurgitation. - Left atrium: The atrium was moderately to severely dilated. - Right atrium: The atrium was mildly dilated. - Tricuspid valve: There was moderate regurgitation. - Pulmonary  arteries: Systolic pressure was moderately increased.   PA peak pressure: 42 mm Hg (S).    Patient Profile     His 83 year old female has a history of atrial fibrillation as well as tachycardia-bradycardia syndrome.  She had a pacemaker placed in [place] a [place] scientific device and number of years ago.  She was maintained on amiodarone and metoprolol and did fairly well with that and then moved appear to be closer to her daughters.  Dr. Lovena Le continued her on her therapy and her amiodarone was stopped in 2016 by physician in [place].  She appeared to develop more atrial fibrillation following that but was doing fairly well and recently saw her primary doctor who noted some edema and ordered blood testing showing mild elevation of her BNP level.  An echocardiogram was ordered and was done 5 days ago showing an ejection fraction of 30-35% with marked left atrial enlargement and moderate to severe mitral regurgitation.   Assessment & Plan     1. Atrial fibrillation with RVR: On digoxin 125 mcg PO,  Lopressor 50 mg BID PO, Cardizem IV drip,  and Xarelto. Had a few episodes of rate controlled atrial fibrillation but mostly in SR. -- rate appears to be well controlled and she is feeling better today,  Will increase lopressor to 75 bid   Pacer to be interrogated   REview with Beckie Salts   2. Acute on chronic systolic heart failure: Her recent echo suggests LV dysfunction and severe mitral regurgitation, question the contribution of atrial fibrillation and LBBB to that per Dr. Wynonia Lawman. -- BNP 541.4, her chest xray yesterday showed Atherosclerotic calcifications present in the transverse aorta but no overt edema. Physical exam shows some trace edema and mildly elevated JVP.  Will give lasix x 1     Etiol of LV dysfunction and MR  ? Ischemic ( no chest pains but atherosclerosis), ? afib with RVR  (interroagate pacer   In April had not had much)  ? Pacer induced LV dysfunction     3. Severe  mitral regurgitation:   Will review echo  Give lasix x 1    5. Long term use of anticoagulation: For her atrial fibrillation, on Xarelto     Signed, Linus Mako, PA-C  01/28/2017, 7:58 AM    Pt seen and examined  Agree with findings as noted by T Green  I have amended note to reflect my findings. ON exam,  JVP increased  Rales at R base    Cardiac exam RRR  No  S3  II/VI sytolic murmur at apex  Ext without edema    WIll give IV lasix x 1  Pacer interrogation

## 2017-01-29 LAB — BASIC METABOLIC PANEL
ANION GAP: 8 (ref 5–15)
BUN: 23 mg/dL — ABNORMAL HIGH (ref 6–20)
CO2: 27 mmol/L (ref 22–32)
Calcium: 8.9 mg/dL (ref 8.9–10.3)
Chloride: 103 mmol/L (ref 101–111)
Creatinine, Ser: 1.45 mg/dL — ABNORMAL HIGH (ref 0.44–1.00)
GFR calc Af Amer: 37 mL/min — ABNORMAL LOW (ref >60)
GFR, EST NON AFRICAN AMERICAN: 32 mL/min — AB (ref >60)
GLUCOSE: 120 mg/dL — AB (ref 65–99)
Potassium: 4.3 mmol/L (ref 3.5–5.1)
Sodium: 138 mmol/L (ref 135–145)

## 2017-01-29 MED ORDER — DIGOXIN 125 MCG PO TABS
0.2500 mg | ORAL_TABLET | Freq: Every day | ORAL | Status: DC
  Administered 2017-01-29 – 2017-01-30 (×2): 0.25 mg via ORAL
  Filled 2017-01-29 (×2): qty 2

## 2017-01-29 MED ORDER — POTASSIUM CHLORIDE CRYS ER 20 MEQ PO TBCR
20.0000 meq | EXTENDED_RELEASE_TABLET | Freq: Once | ORAL | Status: AC
  Administered 2017-01-29: 20 meq via ORAL
  Filled 2017-01-29: qty 1

## 2017-01-29 MED ORDER — FUROSEMIDE 10 MG/ML IJ SOLN
60.0000 mg | Freq: Once | INTRAMUSCULAR | Status: AC
  Administered 2017-01-29: 60 mg via INTRAVENOUS
  Filled 2017-01-29: qty 6

## 2017-01-29 NOTE — Progress Notes (Signed)
Progress Note  Patient Name: Tracy Vasquez Date of Encounter: 01/29/2017  Primary Cardiologist: Lovena Le    Subjective   Breathing is better  NO CP    Inpatient Medications    Scheduled Meds: . metoprolol tartrate  75 mg Oral BID  . rivaroxaban  15 mg Oral Daily   Continuous Infusions:  PRN Meds: acetaminophen, ondansetron (ZOFRAN) IV   Vital Signs    Vitals:   01/28/17 1159 01/28/17 2000 01/29/17 0020 01/29/17 0440  BP: 97/65 (!) 102/54 (!) 100/57   Pulse: 75 90 77   Resp: 18 16 16    Temp: 98.3 F (36.8 C) 98.8 F (37.1 C) 98.4 F (36.9 C)   TempSrc: Oral Oral Oral   SpO2: 95% 97% 96%   Weight:    (P) 124 lb 8 oz (56.5 kg)  Height:        Intake/Output Summary (Last 24 hours) at 01/29/17 0916 Last data filed at 01/29/17 0600  Gross per 24 hour  Intake              840 ml  Output              200 ml  Net              640 ml   Filed Weights   01/27/17 2259 01/28/17 0508 01/29/17 0440  Weight: 128 lb 9.6 oz (58.3 kg) 126 lb 11.2 oz (57.5 kg) (P) 124 lb 8 oz (56.5 kg)    Telemetry    Atrial fib   90s to 100s   - Personally Reviewed  ECG      Physical Exam   GEN: No acute distress.   Neck: No JVD Cardiac: Irreg irrreg   , no murmurs, rubs, or gallops.  Respiratory:Rales at bases   GI: Soft, nontender, non-distended  MS: No edema; No deformity. Neuro:  Nonfocal  Psych: Normal affect   Labs    Chemistry Recent Labs Lab 01/27/17 1815 01/27/17 2323  NA 139 142  K 4.6 5.1  CL 106 109  CO2 24 25  GLUCOSE 87 109*  BUN 23* 22*  CREATININE 1.23* 1.22*  CALCIUM 9.6 9.4  PROT  --  7.0  ALBUMIN  --  3.8  AST  --  44*  ALT  --  46  ALKPHOS  --  48  BILITOT  --  0.8  GFRNONAA 39* 39*  GFRAA 45* 45*  ANIONGAP 9 8     Hematology Recent Labs Lab 01/27/17 1815  WBC 10.7*  RBC 4.52  HGB 13.8  HCT 42.3  MCV 93.6  MCH 30.5  MCHC 32.6  RDW 12.8  PLT 189    Cardiac EnzymesNo results for input(s): TROPONINI in the last 168 hours.    Recent Labs Lab 01/27/17 1823  TROPIPOC 0.00     BNP Recent Labs Lab 01/27/17 2323  BNP 541.4*     DDimer No results for input(s): DDIMER in the last 168 hours.   Radiology    Dg Chest 2 View  Result Date: 01/27/2017 CLINICAL DATA:  83 year old female with shortness of breath EXAM: CHEST  2 VIEW COMPARISON:  None. FINDINGS: Left subclavian approach cardiac rhythm maintenance device. Leads project over the right atrium and right ventricle. Suspect left atrial enlargement. Atherosclerotic calcifications present in the transverse aorta. The lungs are slightly hyperinflated with mild interstitial prominence and central bronchitic changes. IMPRESSION: 1. No active cardiopulmonary process. 2. Left atrial enlargement. 3.  Aortic Atherosclerosis (ICD10-170.0) 4. Pulmonary  hyperinflation, interstitial prominence and bronchitic changes suggest underlying COPD. Electronically Signed   By: Jacqulynn Cadet M.D.   On: 01/27/2017 18:56    Cardiac Studies     Patient Profile    Assessment & Plan    1  Atrial fib  Admitted with afib with RVR  INterrogation of device showed no prolonged high heart rates     2  Acute on chronic systolic CHF   Etiol  ? AFib with RVR prolonged  Does not appear to be this based on device interrogation   ? Pacer induced   ? Ischemic    Plan:  Lasix x1 D?C home with plans for R and L heart catheterization next week F?U with GT after    Hold Xarelto 2 nights prior     3  Severe MR  Plan for cath      Signed, Dorris Carnes, MD  01/29/2017, 9:16 AM

## 2017-01-30 NOTE — Progress Notes (Signed)
Pt HR at 140's -150's. Pt ambulated to sink carrying her dinner tray. No c/o chest pains nor palpitations. Continued to observed closely.

## 2017-01-30 NOTE — Progress Notes (Signed)
Progress Note  Patient Name: Tracy Vasquez Date of Encounter: 01/30/2017  Primary Cardiologist: Lovena Le   Subjective   Breathng OK at rest  Gets SOB with activity in room    Inpatient Medications    Scheduled Meds: . digoxin  0.25 mg Oral Daily  . metoprolol tartrate  75 mg Oral BID  . rivaroxaban  15 mg Oral Daily   Continuous Infusions:  PRN Meds: acetaminophen, ondansetron (ZOFRAN) IV   Vital Signs    Vitals:   01/29/17 1824 01/29/17 2100 01/30/17 0001 01/30/17 0406  BP: 91/78 125/63 108/64 118/62  Pulse: (!) 120 (!) 108 94 (!) 109  Resp:  20 20 20   Temp:  97.6 F (36.4 C) 97.9 F (36.6 C) 98.1 F (36.7 C)  TempSrc:  Oral Oral Oral  SpO2:  96% 95% 97%  Weight:    122 lb (55.3 kg)  Height:        Intake/Output Summary (Last 24 hours) at 01/30/17 0618 Last data filed at 01/29/17 2257  Gross per 24 hour  Intake              600 ml  Output              300 ml  Net              300 ml   Filed Weights   01/28/17 0508 01/29/17 0440 01/30/17 0406  Weight: 126 lb 11.2 oz (57.5 kg) 124 lb 8 oz (56.5 kg) 122 lb (55.3 kg)    Telemetry    Atrial fib   90s to 100s   - Personally Reviewed  ECG      Physical Exam   GEN: No acute distress.   Neck: No JVD Cardiac: Irreg irreg  , no murmurs, rubs, or gallops.  Respiratory: Clear to auscultation bilaterally. GI: Soft, nontender, non-distended  MS: No edema; No deformity. Neuro:  Nonfocal  Psych: Normal affect   Labs    Chemistry Recent Labs Lab 01/27/17 1815 01/27/17 2323 01/29/17 1203  NA 139 142 138  K 4.6 5.1 4.3  CL 106 109 103  CO2 24 25 27   GLUCOSE 87 109* 120*  BUN 23* 22* 23*  CREATININE 1.23* 1.22* 1.45*  CALCIUM 9.6 9.4 8.9  PROT  --  7.0  --   ALBUMIN  --  3.8  --   AST  --  44*  --   ALT  --  46  --   ALKPHOS  --  48  --   BILITOT  --  0.8  --   GFRNONAA 39* 39* 32*  GFRAA 45* 45* 37*  ANIONGAP 9 8 8      Hematology Recent Labs Lab 01/27/17 1815  WBC 10.7*  RBC 4.52    HGB 13.8  HCT 42.3  MCV 93.6  MCH 30.5  MCHC 32.6  RDW 12.8  PLT 189    Cardiac EnzymesNo results for input(s): TROPONINI in the last 168 hours.  Recent Labs Lab 01/27/17 1823  TROPIPOC 0.00     BNP Recent Labs Lab 01/27/17 2323  BNP 541.4*     DDimer No results for input(s): DDIMER in the last 168 hours.   Radiology    No results found.  Cardiac Studies     Patient Profile       Assessment & Plan    1  Atrial fib  Rates have been up  A little better today  I have added digoxin   Continue  b blocker   Follow   Hold Xarelto with cath    2  Acute on chronic systolic CHF  Volume is not bad but will set up for R and L heart cath for Monday  To r/o CAD as reason for decline in LVEF and development of MR   There are regional wall motion differences that are concerning   3     Signed, Dorris Carnes, MD  01/30/2017, 6:18 AM

## 2017-01-30 NOTE — Progress Notes (Signed)
Pharmacist Heart Failure Core Measure Documentation  Assessment: Tracy Vasquez has an EF documented as 30-35% on 7/20 by Echo.  Rationale: Heart failure patients with left ventricular systolic dysfunction (LVSD) and an EF < 40% should be prescribed an angiotensin converting enzyme inhibitor (ACEI) or angiotensin receptor blocker (ARB) at discharge unless a contraindication is documented in the medical record.  This patient is not currently on an ACEI or ARB for HF.  This note is being placed in the record in order to provide documentation that a contraindication to the use of these agents is present for this encounter.  ACE Inhibitor or Angiotensin Receptor Blocker is contraindicated (specify all that apply)  []   ACEI allergy AND ARB allergy []   Angioedema []   Moderate or severe aortic stenosis []   Hyperkalemia []   Hypotension []   Renal artery stenosis [x]   Worsening renal function, preexisting renal disease or dysfunction   Hildred Laser, Pharm D 01/30/2017 1:54 PM

## 2017-01-31 LAB — PROTIME-INR
INR: 1.15
Prothrombin Time: 14.8 seconds (ref 11.4–15.2)

## 2017-01-31 MED ORDER — ASPIRIN 81 MG PO CHEW
81.0000 mg | CHEWABLE_TABLET | ORAL | Status: AC
  Administered 2017-02-01: 81 mg via ORAL
  Filled 2017-01-31: qty 1

## 2017-01-31 MED ORDER — SODIUM CHLORIDE 0.9% FLUSH: 3.0000 mL | INTRAVENOUS | Status: DC | PRN

## 2017-01-31 MED ORDER — SODIUM CHLORIDE 0.9 % IV SOLN
INTRAVENOUS | Status: DC
  Administered 2017-02-01: 07:00:00 via INTRAVENOUS

## 2017-01-31 MED ORDER — DIGOXIN 125 MCG PO TABS
0.1250 mg | ORAL_TABLET | Freq: Every day | ORAL | Status: DC
  Administered 2017-01-31 – 2017-02-01 (×2): 0.125 mg via ORAL
  Filled 2017-01-31 (×2): qty 1

## 2017-01-31 MED ORDER — METOPROLOL TARTRATE 100 MG PO TABS
100.0000 mg | ORAL_TABLET | Freq: Two times a day (BID) | ORAL | Status: DC
  Administered 2017-01-31 – 2017-02-03 (×6): 100 mg via ORAL
  Filled 2017-01-31 (×6): qty 1

## 2017-01-31 MED ORDER — SODIUM CHLORIDE 0.9% FLUSH
3.0000 mL | Freq: Two times a day (BID) | INTRAVENOUS | Status: DC
Start: 2017-01-31 — End: 2017-02-01
  Administered 2017-01-31: 3 mL via INTRAVENOUS

## 2017-01-31 MED ORDER — SODIUM CHLORIDE 0.9 % IV SOLN: 250.0000 mL | INTRAVENOUS | Status: DC | PRN

## 2017-01-31 NOTE — Progress Notes (Signed)
Progress Note  Patient Name: Tracy Vasquez Date of Encounter: 01/31/2017  Primary Cardiologist: Lovena Le   Subjective   Feeling better this am  Not SOB when washed up    Inpatient Medications    Scheduled Meds: . digoxin  0.25 mg Oral Daily  . metoprolol tartrate  75 mg Oral BID   Continuous Infusions:  PRN Meds: acetaminophen, ondansetron (ZOFRAN) IV   Vital Signs    Vitals:   01/30/17 1408 01/30/17 1703 01/30/17 2026 01/31/17 0553  BP: (!) 107/58  122/61 118/66  Pulse: 90 96 89 82  Resp: 20  18 18   Temp: 98.2 F (36.8 C)  98.1 F (36.7 C) 97.6 F (36.4 C)  TempSrc: Oral  Oral Oral  SpO2: 95%  98% 98%  Weight:    123 lb 9.6 oz (56.1 kg)  Height:        Intake/Output Summary (Last 24 hours) at 01/31/17 0743 Last data filed at 01/31/17 0500  Gross per 24 hour  Intake              360 ml  Output              701 ml  Net             -341 ml   Filed Weights   01/29/17 0440 01/30/17 0406 01/31/17 0553  Weight: 124 lb 8 oz (56.5 kg) 122 lb (55.3 kg) 123 lb 9.6 oz (56.1 kg)    Telemetry      Afib 90s to 120s  - Personally Reviewed  ECG      Physical Exam   GEN: No acute distress.   Neck: No JVD Cardiac: Irreg irreg  , no murmurs, rubs, or gallops.  Respiratory: Clear to auscultation bilaterally. GI: Soft, nontender, non-distended  MS: No edema; No deformity. Neuro:  Nonfocal  Psych: Normal affect   Labs    Chemistry  Recent Labs Lab 01/27/17 1815 01/27/17 2323 01/29/17 1203  NA 139 142 138  K 4.6 5.1 4.3  CL 106 109 103  CO2 24 25 27   GLUCOSE 87 109* 120*  BUN 23* 22* 23*  CREATININE 1.23* 1.22* 1.45*  CALCIUM 9.6 9.4 8.9  PROT  --  7.0  --   ALBUMIN  --  3.8  --   AST  --  44*  --   ALT  --  46  --   ALKPHOS  --  48  --   BILITOT  --  0.8  --   GFRNONAA 39* 39* 32*  GFRAA 45* 45* 37*  ANIONGAP 9 8 8      Hematology  Recent Labs Lab 01/27/17 1815  WBC 10.7*  RBC 4.52  HGB 13.8  HCT 42.3  MCV 93.6  MCH 30.5  MCHC  32.6  RDW 12.8  PLT 189    Cardiac EnzymesNo results for input(s): TROPONINI in the last 168 hours.   Recent Labs Lab 01/27/17 1823  TROPIPOC 0.00     BNP  Recent Labs Lab 01/27/17 2323  BNP 541.4*     DDimer No results for input(s): DDIMER in the last 168 hours.   Radiology    No results found.  Cardiac Studies     Patient Profile       Assessment & Plan    1  Atrial fib  Rates remain difficult to control  On dig  Now increase metoprolol 100 bid    2  Acute on chronic systolic CHF  Volume status is not bad    I have reviewed echo.  RVEF normal .  LVEF down with region wall motion abnormalities  Discussed with pt again  Recomm R and L heart cath to define. Pt understands and agrees to proceed.   Signed, Dorris Carnes, MD  01/31/2017, 7:43 AM

## 2017-01-31 NOTE — Progress Notes (Signed)
Patient stated she was going back and forth about whether or not to have heart cath tomorrow.  Patient stated that she does want to have it done and wanted me to let Dr. Harrington Challenger know.  Isaac Laud, [place] made aware.

## 2017-02-01 ENCOUNTER — Encounter (HOSPITAL_COMMUNITY)
Admission: EM | Disposition: A | Discharge status: Home / Self Care | Payer: Self-pay | Source: Home / Self Care | Attending: Internal Medicine

## 2017-02-01 DIAGNOSIS — I428 Other cardiomyopathies: Secondary | ICD-10-CM

## 2017-02-01 HISTORY — PX: RIGHT/LEFT HEART CATH AND CORONARY ANGIOGRAPHY: CATH118266

## 2017-02-01 LAB — POCT I-STAT 3, VENOUS BLOOD GAS (G3P V)
ACID-BASE EXCESS: 1 mmol/L (ref 0.0–2.0)
Bicarbonate: 27.4 mmol/L (ref 20.0–28.0)
O2 SAT: 62 %
PCO2 VEN: 47.2 mmHg (ref 44.0–60.0)
TCO2: 29 mmol/L (ref 0–100)
pH, Ven: 7.372 (ref 7.250–7.430)
pO2, Ven: 33 mmHg (ref 32.0–45.0)

## 2017-02-01 LAB — BASIC METABOLIC PANEL
Anion gap: 8 (ref 5–15)
BUN: 19 mg/dL (ref 6–20)
CALCIUM: 9.3 mg/dL (ref 8.9–10.3)
CO2: 25 mmol/L (ref 22–32)
CREATININE: 1.06 mg/dL — AB (ref 0.44–1.00)
Chloride: 107 mmol/L (ref 101–111)
GFR, EST AFRICAN AMERICAN: 54 mL/min — AB (ref >60)
GFR, EST NON AFRICAN AMERICAN: 47 mL/min — AB (ref >60)
Glucose, Bld: 86 mg/dL (ref 65–99)
Potassium: 4.8 mmol/L (ref 3.5–5.1)
SODIUM: 140 mmol/L (ref 135–145)

## 2017-02-01 SURGERY — RIGHT/LEFT HEART CATH AND CORONARY ANGIOGRAPHY
Anesthesia: LOCAL

## 2017-02-01 MED ORDER — MIDAZOLAM HCL 2 MG/2ML IJ SOLN
INTRAMUSCULAR | Status: DC | PRN
  Administered 2017-02-01: 1 mg via INTRAVENOUS

## 2017-02-01 MED ORDER — HEPARIN (PORCINE) IN NACL 2-0.9 UNIT/ML-% IJ SOLN
INTRAMUSCULAR | Status: AC
  Filled 2017-02-01: qty 1000

## 2017-02-01 MED ORDER — RIVAROXABAN 15 MG PO TABS
15.0000 mg | ORAL_TABLET | Freq: Every day | ORAL | Status: DC
  Administered 2017-02-02: 15 mg via ORAL
  Filled 2017-02-01: qty 1

## 2017-02-01 MED ORDER — FENTANYL CITRATE (PF) 100 MCG/2ML IJ SOLN
INTRAMUSCULAR | Status: DC | PRN
  Administered 2017-02-01: 25 ug via INTRAVENOUS

## 2017-02-01 MED ORDER — SODIUM CHLORIDE 0.9 % IV SOLN: 250.0000 mL | INTRAVENOUS | Status: DC | PRN

## 2017-02-01 MED ORDER — SODIUM CHLORIDE 0.9 % WEIGHT BASED INFUSION: 1.0000 mL/kg/h | INTRAVENOUS | Status: AC

## 2017-02-01 MED ORDER — ONDANSETRON HCL 4 MG/2ML IJ SOLN: 4.0000 mg | Freq: Four times a day (QID) | INTRAMUSCULAR | Status: DC | PRN

## 2017-02-01 MED ORDER — SODIUM CHLORIDE 0.9% FLUSH
3.0000 mL | Freq: Two times a day (BID) | INTRAVENOUS | Status: DC
  Administered 2017-02-02 (×2): 3 mL via INTRAVENOUS

## 2017-02-01 MED ORDER — SODIUM CHLORIDE 0.9% FLUSH: 3.0000 mL | INTRAVENOUS | Status: DC | PRN

## 2017-02-01 MED ORDER — DIAZEPAM 2 MG PO TABS: 2.0000 mg | ORAL_TABLET | Freq: Four times a day (QID) | ORAL | Status: DC | PRN

## 2017-02-01 MED ORDER — MIDAZOLAM HCL 2 MG/2ML IJ SOLN
INTRAMUSCULAR | Status: AC
  Filled 2017-02-01: qty 2

## 2017-02-01 MED ORDER — LIDOCAINE HCL (PF) 1 % IJ SOLN
INTRAMUSCULAR | Status: DC | PRN
  Administered 2017-02-01: 15 mL via INTRADERMAL

## 2017-02-01 MED ORDER — ACETAMINOPHEN 325 MG PO TABS: 650.0000 mg | ORAL_TABLET | ORAL | Status: DC | PRN

## 2017-02-01 MED ORDER — HEPARIN (PORCINE) IN NACL 2-0.9 UNIT/ML-% IJ SOLN
INTRAMUSCULAR | Status: AC | PRN
  Administered 2017-02-01: 1000 mL

## 2017-02-01 MED ORDER — LIDOCAINE HCL (PF) 1 % IJ SOLN
INTRAMUSCULAR | Status: AC
  Filled 2017-02-01: qty 30

## 2017-02-01 MED ORDER — FENTANYL CITRATE (PF) 100 MCG/2ML IJ SOLN
INTRAMUSCULAR | Status: AC
  Filled 2017-02-01: qty 2

## 2017-02-01 SURGICAL SUPPLY — 13 items
CATH INFINITI 5FR MULTPACK ANG (CATHETERS) ×2 IMPLANT
CATH SWAN GANZ 7F STRAIGHT (CATHETERS) ×2 IMPLANT
KIT HEART LEFT (KITS) ×2 IMPLANT
PACK CARDIAC CATHETERIZATION (CUSTOM PROCEDURE TRAY) ×2 IMPLANT
SHEATH PINNACLE 5F 10CM (SHEATH) ×2 IMPLANT
SHEATH PINNACLE 7F 10CM (SHEATH) ×2 IMPLANT
SYR MEDRAD MARK V 150ML (SYRINGE) ×2 IMPLANT
TRANSDUCER W/STOPCOCK (MISCELLANEOUS) ×2 IMPLANT
TUBING ART PRESS 72  MALE/FEM (TUBING) ×1
TUBING ART PRESS 72 MALE/FEM (TUBING) ×1 IMPLANT
TUBING CIL FLEX 10 FLL-RA (TUBING) ×2 IMPLANT
WIRE EMERALD 3MM-J .025X260CM (WIRE) ×2 IMPLANT
WIRE EMERALD 3MM-J .035X150CM (WIRE) ×2 IMPLANT

## 2017-02-01 NOTE — Interval H&P Note (Signed)
Cath Lab Visit (complete for each Cath Lab visit)  Clinical Evaluation Leading to the Procedure:   ACS: No.  Non-ACS:    Anginal Classification: CCS II  Anti-ischemic medical therapy: Minimal Therapy (1 class of medications)  Non-Invasive Test Results: No non-invasive testing performed  Prior CABG: No previous CABG      History and Physical Interval Note:  02/01/2017 10:51 AM  [name]  has presented today for surgery, with the diagnosis of SOB  The various methods of treatment have been discussed with the patient and family. After consideration of risks, benefits and other options for treatment, the patient has consented to  Procedure(s): Right/Left Heart Cath and Coronary Angiography (N/A) as a surgical intervention .  The patient's history has been reviewed, patient examined, no change in status, stable for surgery.  I have reviewed the patient's chart and labs.  Questions were answered to the patient's satisfaction.     Tracy Vasquez

## 2017-02-01 NOTE — Progress Notes (Addendum)
Site area: RFA/RFV Site Prior to Removal:  Level 0 Pressure Applied For:20min Manual:   yes Patient Status During Pull:  stable Post Pull Site:  Level 0 Post Pull Instructions Given:   Post Pull Pulses Present: doppler Dressing Applied:  tegaderm Bedrest begins @ 1210 till1610 Comments:removed by Gentry Fitz

## 2017-02-01 NOTE — H&P (View-Only) (Signed)
Progress Note  Patient Name: Tracy Vasquez Date of Encounter: 01/31/2017  Primary Cardiologist: Lovena Le   Subjective   Feeling better this am  Not SOB when washed up    Inpatient Medications    Scheduled Meds: . digoxin  0.25 mg Oral Daily  . metoprolol tartrate  75 mg Oral BID   Continuous Infusions:  PRN Meds: acetaminophen, ondansetron (ZOFRAN) IV   Vital Signs    Vitals:   01/30/17 1408 01/30/17 1703 01/30/17 2026 01/31/17 0553  BP: (!) 107/58  122/61 118/66  Pulse: 90 96 89 82  Resp: 20  18 18   Temp: 98.2 F (36.8 C)  98.1 F (36.7 C) 97.6 F (36.4 C)  TempSrc: Oral  Oral Oral  SpO2: 95%  98% 98%  Weight:    123 lb 9.6 oz (56.1 kg)  Height:        Intake/Output Summary (Last 24 hours) at 01/31/17 0743 Last data filed at 01/31/17 0500  Gross per 24 hour  Intake              360 ml  Output              701 ml  Net             -341 ml   Filed Weights   01/29/17 0440 01/30/17 0406 01/31/17 0553  Weight: 124 lb 8 oz (56.5 kg) 122 lb (55.3 kg) 123 lb 9.6 oz (56.1 kg)    Telemetry      Afib 90s to 120s  - Personally Reviewed  ECG      Physical Exam   GEN: No acute distress.   Neck: No JVD Cardiac: Irreg irreg  , no murmurs, rubs, or gallops.  Respiratory: Clear to auscultation bilaterally. GI: Soft, nontender, non-distended  MS: No edema; No deformity. Neuro:  Nonfocal  Psych: Normal affect   Labs    Chemistry  Recent Labs Lab 01/27/17 1815 01/27/17 2323 01/29/17 1203  NA 139 142 138  K 4.6 5.1 4.3  CL 106 109 103  CO2 24 25 27   GLUCOSE 87 109* 120*  BUN 23* 22* 23*  CREATININE 1.23* 1.22* 1.45*  CALCIUM 9.6 9.4 8.9  PROT  --  7.0  --   ALBUMIN  --  3.8  --   AST  --  44*  --   ALT  --  46  --   ALKPHOS  --  48  --   BILITOT  --  0.8  --   GFRNONAA 39* 39* 32*  GFRAA 45* 45* 37*  ANIONGAP 9 8 8      Hematology  Recent Labs Lab 01/27/17 1815  WBC 10.7*  RBC 4.52  HGB 13.8  HCT 42.3  MCV 93.6  MCH 30.5  MCHC  32.6  RDW 12.8  PLT 189    Cardiac EnzymesNo results for input(s): TROPONINI in the last 168 hours.   Recent Labs Lab 01/27/17 1823  TROPIPOC 0.00     BNP  Recent Labs Lab 01/27/17 2323  BNP 541.4*     DDimer No results for input(s): DDIMER in the last 168 hours.   Radiology    No results found.  Cardiac Studies     Patient Profile       Assessment & Plan    1  Atrial fib  Rates remain difficult to control  On dig  Now increase metoprolol 100 bid    2  Acute on chronic systolic CHF  Volume status is not bad    I have reviewed echo.  RVEF normal .  LVEF down with region wall motion abnormalities  Discussed with pt again  Recomm R and L heart cath to define. Pt understands and agrees to proceed.   Signed, Dorris Carnes, MD  01/31/2017, 7:43 AM

## 2017-02-02 ENCOUNTER — Encounter (HOSPITAL_COMMUNITY)
Admission: EM | Disposition: A | Discharge status: Home / Self Care | Payer: Self-pay | Source: Home / Self Care | Attending: Internal Medicine

## 2017-02-02 ENCOUNTER — Encounter (HOSPITAL_COMMUNITY): Payer: Self-pay | Admitting: Cardiovascular Disease

## 2017-02-02 ENCOUNTER — Ambulatory Visit: Payer: Medicare PPO | Admitting: Internal Medicine

## 2017-02-02 DIAGNOSIS — Z7901 Long term (current) use of anticoagulants: Secondary | ICD-10-CM

## 2017-02-02 LAB — CBC
HCT: 41.1 % (ref 36.0–46.0)
Hemoglobin: 13.4 g/dL (ref 12.0–15.0)
MCH: 30.2 pg (ref 26.0–34.0)
MCHC: 32.6 g/dL (ref 30.0–36.0)
MCV: 92.8 fL (ref 78.0–100.0)
PLATELETS: 202 10*3/uL (ref 150–400)
RBC: 4.43 MIL/uL (ref 3.87–5.11)
RDW: 12.7 % (ref 11.5–15.5)
WBC: 9.5 10*3/uL (ref 4.0–10.5)

## 2017-02-02 LAB — BASIC METABOLIC PANEL
ANION GAP: 6 (ref 5–15)
BUN: 18 mg/dL (ref 6–20)
CALCIUM: 9 mg/dL (ref 8.9–10.3)
CO2: 24 mmol/L (ref 22–32)
Chloride: 108 mmol/L (ref 101–111)
Creatinine, Ser: 1.08 mg/dL — ABNORMAL HIGH (ref 0.44–1.00)
GFR, EST AFRICAN AMERICAN: 53 mL/min — AB (ref >60)
GFR, EST NON AFRICAN AMERICAN: 45 mL/min — AB (ref >60)
GLUCOSE: 83 mg/dL (ref 65–99)
POTASSIUM: 5.1 mmol/L (ref 3.5–5.1)
SODIUM: 138 mmol/L (ref 135–145)

## 2017-02-02 LAB — DIGOXIN LEVEL: DIGOXIN LVL: 0.9 ng/mL (ref 0.8–2.0)

## 2017-02-02 SURGERY — RIGHT/LEFT HEART CATH AND CORONARY ANGIOGRAPHY
Anesthesia: LOCAL

## 2017-02-02 MED ORDER — DIGOXIN 125 MCG PO TABS
0.0625 mg | ORAL_TABLET | Freq: Every day | ORAL | Status: DC
Start: 2017-02-02 — End: 2017-02-03
  Administered 2017-02-02: 0.0625 mg via ORAL
  Filled 2017-02-02: qty 1

## 2017-02-02 MED ORDER — AMIODARONE HCL 200 MG PO TABS
200.0000 mg | ORAL_TABLET | Freq: Two times a day (BID) | ORAL | Status: DC
  Administered 2017-02-02 – 2017-02-03 (×3): 200 mg via ORAL
  Filled 2017-02-02 (×3): qty 1

## 2017-02-02 NOTE — Care Management Important Message (Signed)
Important Message  Patient Details  Name: Tracy Vasquez MRN: [id number] Date of Birth: 02-Sep-1929   Medicare Important Message Given:  Yes    Bracy Pepper 02/02/2017, 11:38 AM

## 2017-02-02 NOTE — Progress Notes (Addendum)
Progress Note  Patient Name: Tracy Vasquez Date of Encounter: 02/02/2017  Primary Cardiologist: Dr. Lovena Le  Subjective   Denies any CP or SOB.  Cath showed essentially normal coronary arteries  Inpatient Medications    Scheduled Meds: . digoxin  0.125 mg Oral Daily  . metoprolol tartrate  100 mg Oral BID  . rivaroxaban  15 mg Oral Q supper  . sodium chloride flush  3 mL Intravenous Q12H   Continuous Infusions: . sodium chloride     PRN Meds: sodium chloride, acetaminophen, acetaminophen, diazepam, ondansetron (ZOFRAN) IV, ondansetron (ZOFRAN) IV, sodium chloride flush   Vital Signs    Vitals:   02/01/17 2000 02/01/17 2328 02/02/17 0000 02/02/17 0400  BP: 132/73 139/81 116/88 134/78  Pulse: 91 95 81 71  Resp: 18  18 16   Temp: 98.1 F (36.7 C)  98.3 F (36.8 C) 98.1 F (36.7 C)  TempSrc: Oral  Oral Oral  SpO2: 98%  99% 99%  Weight:    124 lb 5.4 oz (56.4 kg)  Height:        Intake/Output Summary (Last 24 hours) at 02/02/17 0743 Last data filed at 02/02/17 0600  Gross per 24 hour  Intake          1108.43 ml  Output             1450 ml  Net          -341.57 ml   Filed Weights   01/31/17 0553 02/01/17 0600 02/02/17 0400  Weight: 123 lb 9.6 oz (56.1 kg) 123 lb 4.8 oz (55.9 kg) 124 lb 5.4 oz (56.4 kg)     Physical Exam   General: Well developed, well nourished, female appearing in no acute distress. Head: Normocephalic, atraumatic.  Neck: Supple without bruits, JVD. Lungs:  Resp regular and unlabored, CTA. Heart: irregularly irregular, S1, S2, no S3, S4, or rub. 2/6 holosystolic murmur at apex Abdomen: Soft, non-tender, non-distended with normoactive bowel sounds. No hepatomegaly. No rebound/guarding. No obvious abdominal masses. Extremities: No clubbing, cyanosis, edema. Distal pedal pulses are 2+ bilaterally. Neuro: Alert and oriented X 3. Moves all extremities spontaneously. Psych: Normal affect.  Labs    Chemistry Recent Labs Lab 01/27/17 2323  01/29/17 1203 02/01/17 0822 02/02/17 0301  NA 142 138 140 138  K 5.1 4.3 4.8 5.1  CL 109 103 107 108  CO2 25 27 25 24   GLUCOSE 109* 120* 86 83  BUN 22* 23* 19 18  CREATININE 1.22* 1.45* 1.06* 1.08*  CALCIUM 9.4 8.9 9.3 9.0  PROT 7.0  --   --   --   ALBUMIN 3.8  --   --   --   AST 44*  --   --   --   ALT 46  --   --   --   ALKPHOS 48  --   --   --   BILITOT 0.8  --   --   --   GFRNONAA 39* 32* 47* 45*  GFRAA 45* 37* 54* 53*  ANIONGAP 8 8 8 6      Hematology Recent Labs Lab 01/27/17 1815 02/02/17 0301  WBC 10.7* 9.5  RBC 4.52 4.43  HGB 13.8 13.4  HCT 42.3 41.1  MCV 93.6 92.8  MCH 30.5 30.2  MCHC 32.6 32.6  RDW 12.8 12.7  PLT 189 202    Cardiac EnzymesNo results for input(s): TROPONINI in the last 168 hours.  Recent Labs Lab 01/27/17 1823  TROPIPOC 0.00  BNP Recent Labs Lab 01/27/17 2323  BNP 541.4*     DDimer No results for input(s): DDIMER in the last 168 hours.   Radiology    No results found.   Telemetry    Atrial fibrillation with occasional RVR - Personally Reviewed  ECG    Atrial fibrillation with occasional V paced beats - Personally Reviewed   Cardiac Studies   Right and left heart catheterization 02/01/17:  Mid RCA lesion, 10 %stenosed.  There is moderate left ventricular systolic dysfunction.  LV end diastolic pressure is normal.  There is trivial (1+) mitral regurgitation.   Normal right heart pressures.  Mild-to-moderate LV dysfunction with an ejection fraction at 40% with global hypocontractility.  There is 1+ angiographic mitral regurgitation.  No significant coronary obstructive disease with smooth 10% narrowing in the mid RCA.  RECOMMENDATION: Medical therapy.  Patient Profile     83 y.o. female with history of permanent atrial fibrillation s/p PPM for tachybrady syndrome admitted with SOB and palpitations and was found to be in rapid afib.  Assessment & Plan    1. Permanent Atrial fibrillation - HR  remains poorly controlled at times on digoxin and metoprolol 100mg  BID - would avoid CCB in setting of LV dysfunction - continue Xarelto 15mg  daily (renally dosed for Creat Cl of 33.82 ml/min) - Will start Amio 200mg  BID for rate control.    2. Acute on chronic systolic heart failure (EF 30-35% by echo and 40% by cath) - she put out 1.4L yesterday and is net negative 872cc. - creatinine stable at 1.08 post cath - continue digoxin and metoprolol - Hopefully LVF will normalize with rate control  - no ACE I or ARB due to borderline elevated K+ at baseline - she appears euvolemic on exam.  No diuretics as right heart pressures were normal at cath  3.  Moderate to severe MR by echo and mild by cath.   - repeat echo in a few months to reassess  4.  Tachybrady syndrome s/p PPM - followed by Dr. Lovena Le  Signed, Fransico Him, MD [facility] 02/03/2017

## 2017-02-02 NOTE — Care Management Important Message (Signed)
Important Message  Patient Details  Name: Tracy Vasquez MRN: [id number] Date of Birth: Sep 08, 1929   Medicare Important Message Given:  Yes    Amylia Collazos 02/02/2017, 11:39 AM

## 2017-02-03 ENCOUNTER — Telehealth: Payer: Self-pay | Admitting: Internal Medicine

## 2017-02-03 ENCOUNTER — Inpatient Hospital Stay (HOSPITAL_COMMUNITY): Payer: Medicare PPO

## 2017-02-03 DIAGNOSIS — I420 Dilated cardiomyopathy: Secondary | ICD-10-CM

## 2017-02-03 DIAGNOSIS — I5022 Chronic systolic (congestive) heart failure: Secondary | ICD-10-CM

## 2017-02-03 LAB — PULMONARY FUNCTION TEST
FEF 25-75 Pre: 1.14 L/sec
FEF2575-%PRED-PRE: 107 %
FEV1-%PRED-PRE: 104 %
FEV1-Pre: 1.73 L
FEV1FVC-%Pred-Pre: 96 %
FEV6-%PRED-PRE: 110 %
FEV6-PRE: 2.33 L
FEV6FVC-%PRED-PRE: 100 %
FVC-%Pred-Pre: 109 %
FVC-PRE: 2.46 L
Pre FEV1/FVC ratio: 70 %
Pre FEV6/FVC Ratio: 95 %
RV % pred: 112 %
RV: 2.77 L
TLC % PRED: 98 %
TLC: 4.82 L

## 2017-02-03 LAB — BASIC METABOLIC PANEL
ANION GAP: 6 (ref 5–15)
BUN: 18 mg/dL (ref 6–20)
CALCIUM: 9.4 mg/dL (ref 8.9–10.3)
CO2: 27 mmol/L (ref 22–32)
Chloride: 106 mmol/L (ref 101–111)
Creatinine, Ser: 1.07 mg/dL — ABNORMAL HIGH (ref 0.44–1.00)
GFR, EST AFRICAN AMERICAN: 53 mL/min — AB (ref >60)
GFR, EST NON AFRICAN AMERICAN: 46 mL/min — AB (ref >60)
Glucose, Bld: 90 mg/dL (ref 65–99)
Potassium: 4.6 mmol/L (ref 3.5–5.1)
SODIUM: 139 mmol/L (ref 135–145)

## 2017-02-03 MED ORDER — DIGOXIN 125 MCG PO TABS: 0.0625 mg | ORAL_TABLET | Freq: Every day | ORAL | 3 refills | Status: DC

## 2017-02-03 MED ORDER — AMIODARONE HCL 200 MG PO TABS: ORAL_TABLET | ORAL | 3 refills | Status: DC

## 2017-02-03 MED ORDER — METOPROLOL TARTRATE 100 MG PO TABS: 100.0000 mg | ORAL_TABLET | Freq: Two times a day (BID) | ORAL | 3 refills | Status: DC

## 2017-02-03 NOTE — Progress Notes (Signed)
Pt is alert oriented with daughter at the bedside, Holding pressure at iv site, Paper work signed

## 2017-02-03 NOTE — Progress Notes (Signed)
Progress Note  Patient Name: Tracy Vasquez Date of Encounter: 02/03/2017  Primary Cardiologist: Dr. Lovena Le  Subjective   Pt states her heart rate is better controlled and she wishes to discharge home today.  Inpatient Medications    Scheduled Meds: . amiodarone  200 mg Oral BID  . digoxin  0.0625 mg Oral Daily  . metoprolol tartrate  100 mg Oral BID  . rivaroxaban  15 mg Oral Q supper  . sodium chloride flush  3 mL Intravenous Q12H   Continuous Infusions: . sodium chloride     PRN Meds: sodium chloride, acetaminophen, acetaminophen, diazepam, ondansetron (ZOFRAN) IV, ondansetron (ZOFRAN) IV, sodium chloride flush   Vital Signs    Vitals:   02/02/17 0400 02/02/17 1140 02/02/17 2043 02/03/17 0447  BP: 134/78 113/62 131/64 118/63  Pulse: 71 71 84 92  Resp: 16 18 18 20   Temp: 98.1 F (36.7 C) 97.7 F (36.5 C) 97.9 F (36.6 C) 97.7 F (36.5 C)  TempSrc: Oral Oral Oral Oral  SpO2: 99% 98% 98% 99%  Weight: 124 lb 5.4 oz (56.4 kg)   130 lb (59 kg)  Height:        Intake/Output Summary (Last 24 hours) at 02/03/17 1016 Last data filed at 02/03/17 0913  Gross per 24 hour  Intake              960 ml  Output              840 ml  Net              120 ml   Filed Weights   02/01/17 0600 02/02/17 0400 02/03/17 0447  Weight: 123 lb 4.8 oz (55.9 kg) 124 lb 5.4 oz (56.4 kg) 130 lb (59 kg)     Physical Exam   General: Well developed, well nourished, female appearing in no acute distress. Head: Normocephalic, atraumatic.  Neck: Supple without bruits, no JVD Lungs:  Resp regular and unlabored, CTA. Heart: Irregular rhythm, regular rate, no murmur; no rub. Abdomen: Soft, non-tender, non-distended with normoactive bowel sounds. No hepatomegaly. No rebound/guarding. No obvious abdominal masses. Extremities: No clubbing, cyanosis, no edema. Distal pedal pulses are 2+ bilaterally. Neuro: Alert and oriented X 3. Moves all extremities spontaneously. Psych: Normal  affect.  Labs    Chemistry Recent Labs Lab 01/27/17 2323 01/29/17 1203 02/01/17 0822 02/02/17 0301  NA 142 138 140 138  K 5.1 4.3 4.8 5.1  CL 109 103 107 108  CO2 25 27 25 24   GLUCOSE 109* 120* 86 83  BUN 22* 23* 19 18  CREATININE 1.22* 1.45* 1.06* 1.08*  CALCIUM 9.4 8.9 9.3 9.0  PROT 7.0  --   --   --   ALBUMIN 3.8  --   --   --   AST 44*  --   --   --   ALT 46  --   --   --   ALKPHOS 48  --   --   --   BILITOT 0.8  --   --   --   GFRNONAA 39* 32* 47* 45*  GFRAA 45* 37* 54* 53*  ANIONGAP 8 8 8 6      Hematology Recent Labs Lab 01/27/17 1815 02/02/17 0301  WBC 10.7* 9.5  RBC 4.52 4.43  HGB 13.8 13.4  HCT 42.3 41.1  MCV 93.6 92.8  MCH 30.5 30.2  MCHC 32.6 32.6  RDW 12.8 12.7  PLT 189 202    Cardiac EnzymesNo results for  input(s): TROPONINI in the last 168 hours.  Recent Labs Lab 01/27/17 1823  TROPIPOC 0.00     BNP Recent Labs Lab 01/27/17 2323  BNP 541.4*     DDimer No results for input(s): DDIMER in the last 168 hours.   Radiology    No results found.   Telemetry    Afib in the 80s - Personally Reviewed  ECG    02/02/17: afib  - Personally Reviewed  Cardiac Studies   Right and left heart catheterization 02/01/17:  Mid RCA lesion, 10 %stenosed.  There is moderate left ventricular systolic dysfunction.  LV end diastolic pressure is normal.  There is trivial (1+) mitral regurgitation.  Normal right heart pressures.  Mild-to-moderate LV dysfunction with an ejection fraction at 40% with global hypocontractility. There is 1+ angiographic mitral regurgitation.  No significant coronary obstructive disease with smooth 10% narrowing in the mid RCA.  RECOMMENDATION: Medical therapy.   Echocardiogram 01/22/17: Study Conclusions - Left ventricle: The cavity size was normal. Wall thickness was   normal. Systolic function was moderately to severely reduced. The   estimated ejection fraction was in the range of 30% to 35%.    Akinesis of the mid-apicalanteroseptal and apical myocardium. - Aortic valve: There was mild regurgitation. - Mitral valve: There was moderate to severe regurgitation. - Left atrium: The atrium was moderately to severely dilated. - Right atrium: The atrium was mildly dilated. - Tricuspid valve: There was moderate regurgitation. - Pulmonary arteries: Systolic pressure was moderately increased.   PA peak pressure: 42 mm Hg (S).   Patient Profile     83 y.o. female with history of permanent atrial fibrillation s/p PPM for tachybrady syndrome admitted with SOB and palpitations and was found to be in rapid afib.  Assessment & Plan    1. Permanent Atrial fibrillation - started amiodarone yesterday with better rate control, she is Afib in the 80-90s, with brief bouts of 110-120s last night while she was bathing. Suspect that with continued treatement with amio and digoxin, these bouts will decrease - pt feels well and says her heart "feels better" - would avoid CCB in setting of LV dysfunction - continue Xarelto 15mg  daily (renally dosed for Creat Cl of 33.82 ml/min)  2. Acute on chronic systolic heart failure (EF 30-35% by echo and 40% by cath) - she is net negative 749cc and weighs 130 lbs today (question accuracy as she was 124 lbs yesterday) - BMP pending to check creatinine - continue digoxin and metoprolol - Hopefully LVF will normalize with rate control  - no ACE I or ARB due to borderline elevated K+ at baseline - she appears euvolemic on exam.  No diuretics as right heart pressures were normal at cath  3.  Moderate to severe MR by echo and mild by cath.   - repeat echo in a few months to reassess  4.  Tachybrady syndrome s/p PPM - followed by Dr. Lovena Le   Signed, Tami Lin Duke , PA-C 10:16 AM 02/03/2017 Pager: [phone]  Patient seen and independently examined with Jackquline Bosch, [place]. We discussed all aspects of the encounter. I agree with the assessment and plan as  stated above.  Patient is doing well today and wants to go home.  Heart rate better controlled on Amio, dig and metoprolol.  She does not appear volume overloaded on exam and currently is not on diuretics.  Will repeat 2D echo in 2 months to reassess LVF and MR as HR now controlled.  She  is stable for discharge home and will have followup in our office in 1-2 weeks.  Will continue on lower dose dig at 0.0625mg  daily (dig level yesterday 0.9), Metoprolol 100mg  BID and renal dosed Xarelto at 15mg  daily for CrCL of 33.82 ml/min.   Signed: Fransico Him MD [facility] 02/03/2017

## 2017-02-03 NOTE — Discharge Summary (Signed)
Discharge Summary    Patient ID: Tracy Vasquez,  MRN: [id number], DOB/AGE: Dec 21, 1929 83 y.o.  Admit date: 01/27/2017 Discharge date: 02/03/2017   Primary Care Provider: Evans Lance Primary Cardiologist: Dr. Lovena Le  Discharge Diagnoses    Principal Problem:   Atrial fibrillation with rapid ventricular response [facility]) Active Problems:   Chronic systolic CHF (congestive heart failure) ([place])   Current use of long term anticoagulation   Aortic atherosclerosis (HCC)   Allergies No Known Allergies   History of Present Illness     This 83 year old female has a history of atrial fibrillation as well as tachycardia-bradycardia syndrome.  She had a pacemaker placed in [place] a [place] device a number of years ago.  She was maintained on amiodarone and metoprolol and did fairly well with that and then moved to be closer to her daughters.  Dr. Lovena Le continued her on her therapy and her amiodarone was stopped in 2016 by physician in [place].  She appeared to develop more atrial fibrillation following that but was doing fairly well and recently saw her primary doctor who noted some edema and ordered blood testing showing mild elevation of her BNP level.  An echocardiogram was ordered and was done 5 days ago showing an ejection fraction of 30-35% with marked left atrial enlargement and moderate to severe mitral regurgitation.  There was reported akinesis of the mid apical anteroseptal and apical myocardium.  A digoxin level returned upper limits of normal but she was reported as having stage IV chronic kidney disease and her primary doctor stopped her digoxin a few s adaygo.  She developed increasing shortness of breath, pounding heart rate, and became anxious. She was supposed to see Dr. Lovena Le or his PA tomorrow but became concerned she would not make it through the night because of feeling poorly and shortness of breath, so she presented to the emergency room 01/27/17.  She was  found to be in rapid atrial fibrillation and is currently in no acute distress and her rate is somewhat slower now.  She has no ischemic chest pain.   He hasn't been having much edema recently.  She denies PND orthopnea or claudication.  Hospital Course     Consultants: none  She was admitted and found to be in Afib RVR. IV cardizem was started for rate control. She continued on her xarelto 15 mg for [facility]. On 01/28/17, IV diltiazem was stopped for reduced LV function and digoxin was ordered. Lopressor was titrated to 100 mg BID. She continued to have bouts of RVR so amiodarone was restarted. On 02/03/17, review of telemetry showed better rate control. She reported feeling much better and was ready for discharge. TSH WNL and PFTs obtained 02/03/17 for baseline reading in the setting of restarting amiodarone.  Continue to avoid CCB with LV dysfunction. Discharged on lopressor, digoxin, and amiodarone (200 mg BID x 7 days, then 200 mg daily thereafter).  Right and left heart catheterization revealed grossly normal coronaries and normal filling pressures.   Patient seen and examined by Dr. Radford Pax today and was stable for discharge. All follow up has been arranged.  _____________  Discharge Vitals Blood pressure 104/69, pulse 69, temperature 97.7 F (36.5 C), temperature source Oral, resp. rate 18, height 5\' 3"  (1.6 m), weight 130 lb (59 kg), SpO2 100 %.  Filed Weights   02/01/17 0600 02/02/17 0400 02/03/17 0447  Weight: 123 lb 4.8 oz (55.9 kg) 124 lb 5.4 oz (56.4 kg) 130 lb (59  kg)    Labs & Radiologic Studies    CBC  Recent Labs  02/02/17 0301  WBC 9.5  HGB 13.4  HCT 41.1  MCV 92.8  PLT 315   Basic Metabolic Panel  Recent Labs  02/02/17 0301 02/03/17 1109  NA 138 139  K 5.1 4.6  CL 108 106  CO2 24 27  GLUCOSE 83 90  BUN 18 18  CREATININE 1.08* 1.07*  CALCIUM 9.0 9.4   Liver Function Tests No results for input(s): AST, ALT, ALKPHOS, BILITOT, PROT, ALBUMIN in the last 72  hours. No results for input(s): LIPASE, AMYLASE in the last 72 hours. Cardiac Enzymes No results for input(s): CKTOTAL, CKMB, CKMBINDEX, TROPONINI in the last 72 hours. BNP Invalid input(s): POCBNP D-Dimer No results for input(s): DDIMER in the last 72 hours. Hemoglobin A1C No results for input(s): HGBA1C in the last 72 hours. Fasting Lipid Panel No results for input(s): CHOL, HDL, LDLCALC, TRIG, CHOLHDL, LDLDIRECT in the last 72 hours. Thyroid Function Tests No results for input(s): TSH, T4TOTAL, T3FREE, THYROIDAB in the last 72 hours.  Invalid input(s): FREET3 _____________  Dg Chest 2 View  Result Date: 01/27/2017 CLINICAL DATA:  83 year old female with shortness of breath EXAM: CHEST  2 VIEW COMPARISON:  None. FINDINGS: Left subclavian approach cardiac rhythm maintenance device. Leads project over the right atrium and right ventricle. Suspect left atrial enlargement. Atherosclerotic calcifications present in the transverse aorta. The lungs are slightly hyperinflated with mild interstitial prominence and central bronchitic changes. IMPRESSION: 1. No active cardiopulmonary process. 2. Left atrial enlargement. 3.  Aortic Atherosclerosis (ICD10-170.0) 4. Pulmonary hyperinflation, interstitial prominence and bronchitic changes suggest underlying COPD. Electronically Signed   By: Jacqulynn Cadet M.D.   On: 01/27/2017 18:56     Diagnostic Studies/Procedures    Right and left heart catheterization 02/01/17:  Mid RCA lesion, 10 %stenosed.  There is moderate left ventricular systolic dysfunction.  LV end diastolic pressure is normal.  There is trivial (1+) mitral regurgitation.  Normal right heart pressures.  Mild-to-moderate LV dysfunction with an ejection fraction at 40% with global hypocontractility. There is 1+ angiographic mitral regurgitation.  No significant coronary obstructive disease with smooth 10% narrowing in the mid RCA.  RECOMMENDATION: Medical  therapy.   Echocardiogram 01/22/17: Study Conclusions - Left ventricle: The cavity size was normal. Wall thickness was normal. Systolic function was moderately to severely reduced. The estimated ejection fraction was in the range of 30% to 35%. Akinesis of the mid-apicalanteroseptal and apical myocardium. - Aortic valve: There was mild regurgitation. - Mitral valve: There was moderate to severe regurgitation. - Left atrium: The atrium was moderately to severely dilated. - Right atrium: The atrium was mildly dilated. - Tricuspid valve: There was moderate regurgitation. - Pulmonary arteries: Systolic pressure was moderately increased. PA peak pressure: 42 mm Hg (S).   Disposition   Pt is being discharged home today in good condition.  Follow-up Plans & Appointments    Follow-up Information    Baldwin Jamaica, PA-C Follow up on 02/12/2017.   Specialty:  Cardiology Why:  10:30 for TCM and hospital followup Contact information: [place] [address] [phone]          Discharge Instructions    Diet - low sodium heart healthy    Complete by:  As directed    Discharge instructions    Complete by:  As directed    No driving for 48 hrs. No lifting over 5 lbs for 1  week. No sexual activity for 1 week. Keep procedure site clean & dry. If you notice increased pain, swelling, bleeding or pus, call/return!  You may shower, but no soaking baths/hot tubs/pools for 1 week.   Increase activity slowly    Complete by:  As directed       Discharge Medications   Current Discharge Medication List    START taking these medications   Details  amiodarone (PACERONE) 200 MG tablet Take 200 mg twice daily for 7 days, then decrease to 200 mg daily thereafter. Qty: 45 tablet, Refills: 3    digoxin (DIGITEK) 0.125 MG tablet Take 0.5 tablets (0.0625 mg total) by mouth daily. Qty: 30 tablet, Refills: 3      CONTINUE these medications which have  CHANGED   Details  metoprolol tartrate (LOPRESSOR) 100 MG tablet Take 1 tablet (100 mg total) by mouth 2 (two) times daily. Qty: 180 tablet, Refills: 3      CONTINUE these medications which have NOT CHANGED   Details  Polyethyl Glycol-Propyl Glycol (SYSTANE) 0.4-0.3 % SOLN Place 1 drop into both eyes as needed.    traZODone (DESYREL) 50 MG tablet Take 50 mg by mouth at bedtime.    XARELTO 15 MG TABS tablet TAKE 1 TABLET EVERY DAY  WITH  SUPPER Qty: 90 tablet, Refills: 1           Outstanding Labs/Studies   Repeat echocardiogram in 2 months to assess recovery of LVF and MR after HR controlled.  Duration of Discharge Encounter   Greater than 30 minutes including physician time.  Signed, Tami Lin Duke PA-C 02/03/2017, 2:05 PM

## 2017-02-03 NOTE — Telephone Encounter (Signed)
New message      TCM appt made on 02-12-17 with Tommye Standard per Angie.

## 2017-02-04 NOTE — Telephone Encounter (Signed)
TCM call placed to pt.  Patient contacted regarding discharge from [facility] on 02/03/17.  Patient understands to follow up with provider Donnajean Lopes, PA-C  on 02/12/17 at 10:30 at [address]. Patient understands discharge instructions? yes Patient understands medications and regiment? yes Patient understands to bring all medications to this visit? yes    Pt thanked me for the call.

## 2017-02-11 NOTE — Progress Notes (Signed)
Cardiology Office Note Date:  02/12/2017  Patient ID:  Tracy Vasquez, DOB [birth year], MRN [id number] PCP:  Dr. Garlon Hatchet Cardiologist:  Dr. Lovena Le    Chief Complaint: post hospital visit  History of Present Illness: Tracy Vasquez is a 83 y.o. female with history of Tachy-brady w/PPM, AFib (historically on amiodarone though stopped by an MD in [place] 2016, unknown why), new CM, LBBB, CVA,  was admitted to [facility] 01/27/17 with increasing SOB, found in RAFib with recent discovery of new CM and mod-severe MR, her dig stopped out patient by her PMD with rising creat only a few days prior to admission by notes.  She was initially rate controlled with IV dilt though stopped given reduced EF and dig resumed, diuresed with minimal CHF  Right and left heart catheterization revealed grossly normal coronaries and normal filling pressures and 1+ MR by cath/LV gram.  Discharged on lopressor, digoxin, and amiodarone (200 mg BID x 7 days, then 200 mg daily thereafter).  She has felt well since her hospital visit.  She tells me that the MD in [place] felt she had been on the amiodarone long enough, she had no reports of toxicity or symptoms.  She was and is again unaware and without symptoms from her AF until rate became fast.  No CP or SOB, no dizziness, near syncope or syncope.  She mentions that she never wants to be off the amiodarone again.  She denies nay bleeding or signs of bleeding.  She is on amiodarone 200mg  daily now  Device info: BSci dual chamber PPM [place] 03/02/07[place] Dr. Electa Sniff   Past Medical History:  Diagnosis Date  . Acute gangrenous appendicitis with perforation and peritonitis 11/22/2011  . Anxiety   . Atrial fibrillation (HCC)    chronic anticoag, amio and dig  . CVA (cerebral infarction) 2008   no residual deficits  . Depression   . Hematoma 08/16/2012   spont bleed on warfarin 08/2012  . Osteoporosis     Past Surgical History:  Procedure Laterality Date  .  INSERT / REPLACE / REMOVE PACEMAKER    . LAPAROSCOPIC APPENDECTOMY  11/22/2011   Procedure: APPENDECTOMY LAPAROSCOPIC;  Surgeon: Stark Klein, MD;  Location: [facility];  Service: General;  Laterality: N/A;  . PACEMAKER INSERTION    . RIGHT/LEFT HEART CATH AND CORONARY ANGIOGRAPHY N/A 02/01/2017   Procedure: Right/Left Heart Cath and Coronary Angiography;  Surgeon: Troy Sine, MD;  Location: [place] [facility];  Service: Cardiovascular;  Laterality: N/A;    Current Outpatient Prescriptions  Medication Sig Dispense Refill  . amiodarone (PACERONE) 200 MG tablet Take 200 mg twice daily for 7 days, then decrease to 200 mg daily thereafter. 45 tablet 3  . digoxin (DIGITEK) 0.125 MG tablet Take 0.5 tablets (0.0625 mg total) by mouth daily. 30 tablet 3  . metoprolol tartrate (LOPRESSOR) 100 MG tablet Take 1 tablet (100 mg total) by mouth 2 (two) times daily. 180 tablet 3  . Polyethyl Glycol-Propyl Glycol (SYSTANE) 0.4-0.3 % SOLN Place 1 drop into both eyes as needed.    . traZODone (DESYREL) 50 MG tablet Take 50 mg by mouth at bedtime.    Alveda Reasons 15 MG TABS tablet TAKE 1 TABLET EVERY DAY  WITH  SUPPER 90 tablet 1  . losartan (COZAAR) 25 MG tablet Take 1 tablet (25 mg total) by mouth daily. 30 tablet 3   No current facility-administered medications for this visit.     Allergies:   Patient has  no known allergies.   Social History:  The patient  reports that she has never smoked. She has never used smokeless tobacco. She reports that she does not drink alcohol or use drugs.   Family History:  The patient's family history includes Atrial fibrillation in her father; Coronary artery disease in her mother.  ROS:  Please see the history of present illness.  All other systems are reviewed and otherwise negative.   PHYSICAL EXAM:  VS:  BP 124/64   Pulse 71   Ht 5\' 3"  (1.6 m)   Wt 128 lb (58.1 kg)   SpO2 98%   BMI 22.67 kg/m  BMI: Body mass index is 22.67 kg/m. Well nourished, well developed, in  no acute distress  HEENT: normocephalic, atraumatic  Neck: no JVD, carotid bruits or masses Cardiac:  IRRR; no significant murmurs, no rubs, or gallops Lungs:  CTA b/l, no wheezing, rhonchi or rales  Abd: soft, nontender MS: no deformity, age appropriate atrophy Ext: no edema  Skin: warm and dry, no rash Neuro:  No gross deficits appreciated Psych: euthymic mood, full affect  PPM site is stable, no tethering or discomfort   EKG:  Done today and reviewed by myself is AF, Vpaced with some intrinsic beats PPM interrogation done today by industry and reviewed by myself: Battery again reports <6 months, lead measurements are stable, dual chamber device programmed VVIR 70   Right and left heart catheterization 02/01/17:  Mid RCA lesion, 10 %stenosed.  There is moderate left ventricular systolic dysfunction.  LV end diastolic pressure is normal.  There is trivial (1+) mitral regurgitation.  Normal right heart pressures. Mild-to-moderate LV dysfunction with an ejection fraction at 40% with global hypocontractility. There is 1+ angiographic mitral regurgitation. No significant coronary obstructive disease with smooth 10% narrowing in the mid RCA. RECOMMENDATION: Medical therapy.   Echocardiogram 01/22/17: Study Conclusions - Left ventricle: The cavity size was normal. Wall thickness was normal. Systolic function was moderately to severely reduced. The estimated ejection fraction was in the range of 30% to 35%. Akinesis of the mid-apicalanteroseptal and apical myocardium. - Aortic valve: There was mild regurgitation. - Mitral valve: There was moderate to severe regurgitation. - Left atrium: The atrium was moderately to severely dilated. - Right atrium: The atrium was mildly dilated. - Tricuspid valve: There was moderate regurgitation. - Pulmonary arteries: Systolic pressure was moderately increased. PA peak pressure: 42 mm Hg (S).  Recent Labs: 01/27/2017: ALT 46; B  Natriuretic Peptide 541.4; TSH 2.955 02/02/2017: Hemoglobin 13.4; Platelets 202 02/03/2017: BUN 18; Creatinine, Ser 1.07; Potassium 4.6; Sodium 139  No results found for requested labs within last 8760 hours.   Estimated Creatinine Clearance: 31.8 mL/min (A) (by C-G formula based on SCr of 1.07 mg/dL (H)).   Wt Readings from Last 3 Encounters:  02/12/17 128 lb (58.1 kg)  02/03/17 130 lb (59 kg)  04/07/16 125 lb 6.4 oz (56.9 kg)     Other studies reviewed: Additional studies/records reviewed today include: summarized above  ASSESSMENT AND PLAN:  1. Permanent AFib     Asymptomatic with rate control     CHA2DS2Vasc is at least , on Xarelto (hx ?hematoma on warfarin when supratherpaeutic) she is appropriately dosed for creat cl 38  Discussed amiodarone potential long term side effects, given permanent AF, could try alternative agents though she is insistent that she not be taken off her amiodarone again understanding potential side effects.   2. Tachy-brady w/PPM     Battery is nearing ERI  Keep planned visit in 2 months  3. New NICM     LVEF by LVgram was 40%, 30-35% by echo     Exam does not suggest fluid OL     On BB     Recommend starting ARB, she is agreeable   Disposition: add losartan 25mg  daily, BMET in 3 weeks, F/u with her already scheduled visit in October with Dr. Lovena Le and device check  Current medicines are reviewed at length with the patient today.  The patient did not have any concerns regarding medicines.  Haywood Lasso, PA-C 02/12/2017 6:37 PM     [address]  [place] [address] [phone] (office)  [phone] (fax)

## 2017-02-12 ENCOUNTER — Encounter: Payer: Self-pay | Admitting: Physician Assistant

## 2017-02-12 ENCOUNTER — Ambulatory Visit (INDEPENDENT_AMBULATORY_CARE_PROVIDER_SITE_OTHER): Payer: Medicare PPO | Admitting: Physician Assistant

## 2017-02-12 DIAGNOSIS — I428 Other cardiomyopathies: Secondary | ICD-10-CM

## 2017-02-12 DIAGNOSIS — I482 Chronic atrial fibrillation, unspecified: Secondary | ICD-10-CM

## 2017-02-12 DIAGNOSIS — Z79899 Other long term (current) drug therapy: Secondary | ICD-10-CM

## 2017-02-12 DIAGNOSIS — Z950 Presence of cardiac pacemaker: Secondary | ICD-10-CM

## 2017-02-12 MED ORDER — LOSARTAN POTASSIUM 25 MG PO TABS: 25.0000 mg | ORAL_TABLET | Freq: Every day | ORAL | 3 refills | Status: DC

## 2017-02-12 NOTE — Patient Instructions (Addendum)
Medication Instructions:   START TAKING LOSARTAN 25 MG ONCE A DAY   If you need a refill on your cardiac medications before your next appointment, please call your pharmacy.  Labwork: BMET TSH AND LFT IN  3 WEEKS    Testing/Procedures: NONE ORDERED  TODAY    Follow-Up:  KEEP APPOINTMENT AS SCHEDULE IN October    Any Other Special Instructions Will Be Listed Below (If Applicable).

## 2017-03-05 ENCOUNTER — Other Ambulatory Visit: Payer: Medicare PPO | Admitting: *Deleted

## 2017-03-05 DIAGNOSIS — Z79899 Other long term (current) drug therapy: Secondary | ICD-10-CM

## 2017-03-06 LAB — HEPATIC FUNCTION PANEL
ALK PHOS: 62 IU/L (ref 39–117)
ALT: 38 IU/L — ABNORMAL HIGH (ref 0–32)
AST: 35 IU/L (ref 0–40)
Albumin: 4.2 g/dL (ref 3.5–4.7)
BILIRUBIN, DIRECT: 0.16 mg/dL (ref 0.00–0.40)
Bilirubin Total: 0.5 mg/dL (ref 0.0–1.2)
Total Protein: 7 g/dL (ref 6.0–8.5)

## 2017-03-06 LAB — CBC
HEMATOCRIT: 39.1 % (ref 34.0–46.6)
Hemoglobin: 12.8 g/dL (ref 11.1–15.9)
MCH: 29.8 pg (ref 26.6–33.0)
MCHC: 32.7 g/dL (ref 31.5–35.7)
MCV: 91 fL (ref 79–97)
Platelets: 191 10*3/uL (ref 150–379)
RBC: 4.29 x10E6/uL (ref 3.77–5.28)
RDW: 13.7 % (ref 12.3–15.4)
WBC: 7.9 10*3/uL (ref 3.4–10.8)

## 2017-03-06 LAB — BASIC METABOLIC PANEL
BUN / CREAT RATIO: 16 (ref 12–28)
BUN: 22 mg/dL (ref 8–27)
CO2: 23 mmol/L (ref 20–29)
CREATININE: 1.4 mg/dL — AB (ref 0.57–1.00)
Calcium: 10 mg/dL (ref 8.7–10.3)
Chloride: 103 mmol/L (ref 96–106)
GFR calc Af Amer: 40 mL/min/{1.73_m2} — ABNORMAL LOW (ref >59)
GFR, EST NON AFRICAN AMERICAN: 34 mL/min/{1.73_m2} — AB (ref >59)
Glucose: 69 mg/dL (ref 65–99)
Potassium: 5.1 mmol/L (ref 3.5–5.2)
SODIUM: 143 mmol/L (ref 134–144)

## 2017-03-06 LAB — TSH: TSH: 2.19 u[IU]/mL (ref 0.450–4.500)

## 2017-03-09 ENCOUNTER — Telehealth: Payer: Self-pay | Admitting: *Deleted

## 2017-03-09 NOTE — Telephone Encounter (Signed)
-----   Message from [facility], [place] sent at 03/05/2017  4:41 PM EDT ----- Please let the patient know her kidney function looks a little lower, this may be the Losartan.  Have her stop it and we will revisit at her visit with Dr. Lovena Le an alternative medicine.  Thanks renee

## 2017-03-09 NOTE — Telephone Encounter (Signed)
SPOKE TO PT ABOUT RESULTS AND TO STOP LOSARTAN AND WILL RE DISCUSS OPTIONS WITH DR Lovena Le APPT ON  05-05-17

## 2017-05-05 ENCOUNTER — Encounter: Payer: Self-pay | Admitting: Internal Medicine

## 2017-05-05 ENCOUNTER — Ambulatory Visit (INDEPENDENT_AMBULATORY_CARE_PROVIDER_SITE_OTHER): Payer: Medicare PPO | Admitting: Internal Medicine

## 2017-05-05 VITALS — BP 138/72 | HR 68 | Ht 63.5 in | Wt 129.0 lb

## 2017-05-05 DIAGNOSIS — Z950 Presence of cardiac pacemaker: Secondary | ICD-10-CM

## 2017-05-05 DIAGNOSIS — I482 Chronic atrial fibrillation, unspecified: Secondary | ICD-10-CM

## 2017-05-05 DIAGNOSIS — I428 Other cardiomyopathies: Secondary | ICD-10-CM | POA: Diagnosis not present

## 2017-05-05 NOTE — Patient Instructions (Addendum)
Medication Instructions:  Your physician recommends that you continue on your current medications as directed. Please refer to the Current Medication list given to you today.  Labwork: None ordered.  Testing/Procedures: None ordered.  Follow-Up:  You will follow up with device clinic in 6 weeks.    Your physician wants you to follow-up in: one year with Dr. Lovena Le.   You will receive a reminder letter in the mail two months in advance. If you don't receive a letter, please call our office to schedule the follow-up appointment.   Any Other Special Instructions Will Be Listed Below (If Applicable).  If you need a refill on your cardiac medications before your next appointment, please call your pharmacy.

## 2017-05-05 NOTE — Progress Notes (Signed)
HPI Tracy Vasquez returns today for followup of CHB, PAF, s/p PPM insertion. She is approaching ERI. She has been stable in the interim. We had initially programmed her device VVIR. She has been stable since DC. She has not had palpitations. Minimal edema. No Known Allergies   Current Outpatient Prescriptions  Medication Sig Dispense Refill  . amiodarone (PACERONE) 200 MG tablet Take 200 mg by mouth daily.    . digoxin (DIGITEK) 0.125 MG tablet Take 0.5 tablets (0.0625 mg total) by mouth daily. 30 tablet 3  . metoprolol tartrate (LOPRESSOR) 100 MG tablet Take 1 tablet (100 mg total) by mouth 2 (two) times daily. 180 tablet 3  . traZODone (DESYREL) 50 MG tablet Take 50 mg by mouth at bedtime.    Tracy Vasquez 15 MG TABS tablet TAKE 1 TABLET EVERY DAY  WITH  SUPPER 90 tablet 1   No current facility-administered medications for this visit.      Past Medical History:  Diagnosis Date  . Acute gangrenous appendicitis with perforation and peritonitis 11/22/2011  . Anxiety   . Atrial fibrillation (HCC)    chronic anticoag, amio and dig  . CVA (cerebral infarction) 2008   no residual deficits  . Depression   . Hematoma 08/16/2012   spont bleed on warfarin 08/2012  . Osteoporosis     ROS:   All systems reviewed and negative except as noted in the HPI.   Past Surgical History:  Procedure Laterality Date  . INSERT / REPLACE / REMOVE PACEMAKER    . LAPAROSCOPIC APPENDECTOMY  11/22/2011   Procedure: APPENDECTOMY LAPAROSCOPIC;  Surgeon: Stark Klein, MD;  Location: [place];  Service: General;  Laterality: N/A;  . PACEMAKER INSERTION    . RIGHT/LEFT HEART CATH AND CORONARY ANGIOGRAPHY N/A 02/01/2017   Procedure: Right/Left Heart Cath and Coronary Angiography;  Surgeon: Troy Sine, MD;  Location: [place] [facility];  Service: Cardiovascular;  Laterality: N/A;     Family History  Problem Relation Age of Onset  . Coronary artery disease Mother   . Atrial fibrillation Father       Social History   Social History  . Marital status: Widowed    Spouse name: N/A  . Number of children: N/A  . Years of education: N/A   Occupational History  . Not on file.   Social History Main Topics  . Smoking status: Never Smoker  . Smokeless tobacco: Never Used  . Alcohol use No  . Drug use: No  . Sexual activity: Not Currently   Other Topics Concern  . Not on file   Social History Narrative   Lives with dtr, moved to [place] from [facility] in 2014, widowed early 2013 (60 years married)     BP 138/72   Pulse 68   Ht 5' 3.5" (1.613 m)   Wt 129 lb (58.5 kg)   BMI 22.49 kg/m   Physical Exam:  Well appearing elderly woman, NAD HEENT: Unremarkable Neck:  6 cm JVD, no thyromegally Lymphatics:  No adenopathy Back:  No CVA tenderness Lungs:  Clear with no wheezes HEART:  Regular rate rhythm, no murmurs, no rubs, no clicks Abd:  soft, positive bowel sounds, no organomegally, no rebound, no guarding Ext:  2 plus pulses, no edema, no cyanosis, no clubbing Skin:  No rashes no nodules Neuro:  CN II through XII intact, motor grossly intact   DEVICE  Normal device function.  See PaceArt for details.   Assess/Plan: 1.  PAF - she has reverted back to NSR. She has had her PPM reprogrammed. Will continue her current meds. 2. PPM - she is approaching ERI.  She will continue her current meds. 3. Chronic diastolic heart failure - her symptoms are well controlled. No evidence of volume overload. She is encouraged to maintain a low sodium diet.  Tracy Vasquez.D.

## 2017-05-14 LAB — CUP PACEART INCLINIC DEVICE CHECK
Date Time Interrogation Session: 20181109135823
Implantable Lead Location: 753859
Implantable Lead Model: 4135
Implantable Lead Serial Number: 28356762
Implantable Lead Serial Number: 28365470
Implantable Pulse Generator Implant Date: 20080827
Lead Channel Pacing Threshold Amplitude: 0.4 V
Lead Channel Pacing Threshold Amplitude: 0.5 V
Lead Channel Sensing Intrinsic Amplitude: 5.3 mV
MDC IDC LEAD IMPLANT DT: 20080827
MDC IDC LEAD IMPLANT DT: 20080827
MDC IDC LEAD LOCATION: 753860
MDC IDC MSMT LEADCHNL RA PACING THRESHOLD PULSEWIDTH: 0.4 ms
MDC IDC MSMT LEADCHNL RV PACING THRESHOLD PULSEWIDTH: 0.5 ms
MDC IDC STAT BRADY RA PERCENT PACED: 0 %
MDC IDC STAT BRADY RV PERCENT PACED: 93 %
Pulse Gen Serial Number: 571172

## 2017-06-21 ENCOUNTER — Ambulatory Visit (INDEPENDENT_AMBULATORY_CARE_PROVIDER_SITE_OTHER): Payer: Self-pay | Admitting: *Deleted

## 2017-06-21 DIAGNOSIS — Z950 Presence of cardiac pacemaker: Secondary | ICD-10-CM

## 2017-06-21 DIAGNOSIS — I480 Paroxysmal atrial fibrillation: Secondary | ICD-10-CM

## 2017-06-21 LAB — CUP PACEART INCLINIC DEVICE CHECK
Brady Statistic RA Percent Paced: 90 %
Brady Statistic RV Percent Paced: 95 %
Date Time Interrogation Session: 20181217050000
Implantable Lead Implant Date: 20080827
Implantable Lead Implant Date: 20080827
Implantable Lead Location: 753859
Implantable Lead Location: 753860
Implantable Lead Model: 4135
Implantable Lead Model: 4136
Implantable Lead Serial Number: 28356762
Implantable Lead Serial Number: 28365470
Implantable Pulse Generator Implant Date: 20080827
Lead Channel Setting Pacing Amplitude: 2 V
Lead Channel Setting Pacing Amplitude: 2.4 V
Lead Channel Setting Pacing Pulse Width: 0.5 ms
Lead Channel Setting Sensing Sensitivity: 2.5 mV
Pulse Gen Serial Number: 571172

## 2017-06-21 NOTE — Progress Notes (Signed)
Pacemaker battery check in clinic. No testing performed. Remaining longevity <6 months. 1% AT/AF burden, +Xarelto. Patient education completed. ROV with Device Clinic on 08/05/17 for battery check (billable) and ROV with GT in 04/2018.

## 2017-07-26 ENCOUNTER — Other Ambulatory Visit: Payer: Self-pay | Admitting: Internal Medicine

## 2017-08-05 ENCOUNTER — Ambulatory Visit (INDEPENDENT_AMBULATORY_CARE_PROVIDER_SITE_OTHER): Payer: Self-pay | Admitting: *Deleted

## 2017-08-05 DIAGNOSIS — I480 Paroxysmal atrial fibrillation: Secondary | ICD-10-CM

## 2017-08-05 LAB — CUP PACEART INCLINIC DEVICE CHECK
Date Time Interrogation Session: 20190131120058
Implantable Lead Implant Date: 20080827
Implantable Lead Implant Date: 20080827
Implantable Lead Location: 753859
Implantable Lead Serial Number: 28356762
Implantable Pulse Generator Implant Date: 20080827
MDC IDC LEAD LOCATION: 753860
MDC IDC LEAD SERIAL: 28365470
Pulse Gen Serial Number: 571172

## 2017-08-05 NOTE — Progress Notes (Signed)
Device check for battery. ERI reached 07/12/17. ROV 08/12/17 w/ GT to discuss gen change.

## 2017-08-12 ENCOUNTER — Ambulatory Visit: Payer: Medicare PPO | Admitting: Internal Medicine

## 2017-08-12 ENCOUNTER — Encounter: Payer: Self-pay | Admitting: Internal Medicine

## 2017-08-12 ENCOUNTER — Encounter: Payer: Self-pay | Admitting: *Deleted

## 2017-08-12 VITALS — BP 134/68 | HR 60 | Ht 63.5 in | Wt 126.0 lb

## 2017-08-12 DIAGNOSIS — Z950 Presence of cardiac pacemaker: Secondary | ICD-10-CM | POA: Diagnosis not present

## 2017-08-12 DIAGNOSIS — I5022 Chronic systolic (congestive) heart failure: Secondary | ICD-10-CM | POA: Diagnosis not present

## 2017-08-12 DIAGNOSIS — I495 Sick sinus syndrome: Secondary | ICD-10-CM

## 2017-08-12 DIAGNOSIS — I482 Chronic atrial fibrillation, unspecified: Secondary | ICD-10-CM

## 2017-08-12 LAB — CUP PACEART INCLINIC DEVICE CHECK
Implantable Lead Implant Date: 20080827
Implantable Lead Location: 753860
Implantable Lead Model: 4135
Implantable Lead Model: 4136
Implantable Lead Serial Number: 28356762
Implantable Pulse Generator Implant Date: 20080827
Lead Channel Impedance Value: 480 Ohm
Lead Channel Impedance Value: 580 Ohm
Lead Channel Pacing Threshold Amplitude: 0.4 V
Lead Channel Pacing Threshold Pulse Width: 0.4 ms
Lead Channel Pacing Threshold Pulse Width: 0.5 ms
Lead Channel Setting Pacing Amplitude: 2 V
Lead Channel Setting Sensing Sensitivity: 2.5 mV
MDC IDC LEAD IMPLANT DT: 20080827
MDC IDC LEAD LOCATION: 753859
MDC IDC LEAD SERIAL: 28365470
MDC IDC MSMT LEADCHNL RA PACING THRESHOLD AMPLITUDE: 0.5 V
MDC IDC MSMT LEADCHNL RA SENSING INTR AMPL: 3.5 mV
MDC IDC PG SERIAL: 571172
MDC IDC SESS DTM: 20190207050000
MDC IDC SET LEADCHNL RV PACING AMPLITUDE: 2.4 V
MDC IDC SET LEADCHNL RV PACING PULSEWIDTH: 0.5 ms
MDC IDC STAT BRADY RA PERCENT PACED: 92 %
MDC IDC STAT BRADY RV PERCENT PACED: 97 %

## 2017-08-12 NOTE — H&P (View-Only) (Signed)
HPI [name] returns today for followup. She is a pleasant 84 yo woman with CHB, s/p PPM insertion, HTN, and PAF. She has maintained NSR very nicely. She has reached ERI. She has felt well otherwise. No Known Allergies   Current Outpatient Medications  Medication Sig Dispense Refill  . amiodarone (PACERONE) 200 MG tablet Take 1 tablet (200 mg total) by mouth daily. 90 tablet 2  . digoxin (DIGITEK) 0.125 MG tablet Take 0.5 tablets (0.0625 mg total) by mouth daily. 30 tablet 3  . metoprolol tartrate (LOPRESSOR) 100 MG tablet Take 1 tablet (100 mg total) by mouth 2 (two) times daily. 180 tablet 3  . traZODone (DESYREL) 50 MG tablet Take 50 mg by mouth at bedtime.    Tracy Vasquez 15 MG TABS tablet TAKE 1 TABLET EVERY DAY  WITH  SUPPER 90 tablet 1   No current facility-administered medications for this visit.      Past Medical History:  Diagnosis Date  . Acute gangrenous appendicitis with perforation and peritonitis 11/22/2011  . Anxiety   . Atrial fibrillation (HCC)    chronic anticoag, amio and dig  . CVA (cerebral infarction) 2008   no residual deficits  . Depression   . Hematoma 08/16/2012   spont bleed on warfarin 08/2012  . Osteoporosis     ROS:   All systems reviewed and negative except as noted in the HPI.   Past Surgical History:  Procedure Laterality Date  . INSERT / REPLACE / REMOVE PACEMAKER    . LAPAROSCOPIC APPENDECTOMY  11/22/2011   Procedure: APPENDECTOMY LAPAROSCOPIC;  Surgeon: Stark Klein, MD;  Location: [place];  Service: General;  Laterality: N/A;  . PACEMAKER INSERTION    . RIGHT/LEFT HEART CATH AND CORONARY ANGIOGRAPHY N/A 02/01/2017   Procedure: Right/Left Heart Cath and Coronary Angiography;  Surgeon: Troy Sine, MD;  Location: [place] [facility];  Service: Cardiovascular;  Laterality: N/A;     Family History  Problem Relation Age of Onset  . Coronary artery disease Mother   . Atrial fibrillation Father      Social History    Socioeconomic History  . Marital status: Widowed    Spouse name: Not on file  . Number of children: Not on file  . Years of education: Not on file  . Highest education level: Not on file  Social Needs  . Financial resource strain: Not on file  . Food insecurity - worry: Not on file  . Food insecurity - inability: Not on file  . Transportation needs - medical: Not on file  . Transportation needs - non-medical: Not on file  Occupational History  . Not on file  Tobacco Use  . Smoking status: Never Smoker  . Smokeless tobacco: Never Used  Substance and Sexual Activity  . Alcohol use: No  . Drug use: No  . Sexual activity: Not Currently  Other Topics Concern  . Not on file  Social History Narrative   Lives with dtr, moved to [place] from [facility] in 2014, widowed early 2013 (60 years married)     BP 134/68   Pulse 60   Ht 5' 3.5" (1.613 m)   Wt 126 lb (57.2 kg)   BMI 21.97 kg/m   Physical Exam:  Well appearing elderly woman, NAD HEENT: Unremarkable Neck:  6 cm JVD, no thyromegally Lymphatics:  No adenopathy Back:  No CVA tenderness Lungs:  Clear with no wheezes HEART:  Regular rate rhythm, no murmurs, no rubs,  no clicks Abd:  soft, positive bowel sounds, no organomegally, no rebound, no guarding Ext:  2 plus pulses, no edema, no cyanosis, no clubbing Skin:  No rashes no nodules Neuro:  CN II through XII intact, motor grossly intact  EKG - NSR with pacing induced LBBB  DEVICE  Normal device function.  See PaceArt for details.   Assess/Plan: 1. PPM - her boston Sci device has reached ERI. We will schedule PM gen change out. 2. HTN - her blood pressure is fairly well controlled. No change in meds.  3. PAF - she appears to be maintaining NSR. No change in meds.  Mikle Bosworth.D.

## 2017-08-12 NOTE — Progress Notes (Signed)
HPI Mrs. Trippett returns today for followup. She is a pleasant 84 yo woman with CHB, s/p PPM insertion, HTN, and PAF. She has maintained NSR very nicely. She has reached ERI. She has felt well otherwise. No Known Allergies   Current Outpatient Medications  Medication Sig Dispense Refill  . amiodarone (PACERONE) 200 MG tablet Take 1 tablet (200 mg total) by mouth daily. 90 tablet 2  . digoxin (DIGITEK) 0.125 MG tablet Take 0.5 tablets (0.0625 mg total) by mouth daily. 30 tablet 3  . metoprolol tartrate (LOPRESSOR) 100 MG tablet Take 1 tablet (100 mg total) by mouth 2 (two) times daily. 180 tablet 3  . traZODone (DESYREL) 50 MG tablet Take 50 mg by mouth at bedtime.    Alveda Reasons 15 MG TABS tablet TAKE 1 TABLET EVERY DAY  WITH  SUPPER 90 tablet 1   No current facility-administered medications for this visit.      Past Medical History:  Diagnosis Date  . Acute gangrenous appendicitis with perforation and peritonitis 11/22/2011  . Anxiety   . Atrial fibrillation (HCC)    chronic anticoag, amio and dig  . CVA (cerebral infarction) 2008   no residual deficits  . Depression   . Hematoma 08/16/2012   spont bleed on warfarin 08/2012  . Osteoporosis     ROS:   All systems reviewed and negative except as noted in the HPI.   Past Surgical History:  Procedure Laterality Date  . INSERT / REPLACE / REMOVE PACEMAKER    . LAPAROSCOPIC APPENDECTOMY  11/22/2011   Procedure: APPENDECTOMY LAPAROSCOPIC;  Surgeon: Stark Klein, MD;  Location: [place];  Service: General;  Laterality: N/A;  . PACEMAKER INSERTION    . RIGHT/LEFT HEART CATH AND CORONARY ANGIOGRAPHY N/A 02/01/2017   Procedure: Right/Left Heart Cath and Coronary Angiography;  Surgeon: Troy Sine, MD;  Location: [place] [facility];  Service: Cardiovascular;  Laterality: N/A;     Family History  Problem Relation Age of Onset  . Coronary artery disease Mother   . Atrial fibrillation Father      Social History    Socioeconomic History  . Marital status: Widowed    Spouse name: Not on file  . Number of children: Not on file  . Years of education: Not on file  . Highest education level: Not on file  Social Needs  . Financial resource strain: Not on file  . Food insecurity - worry: Not on file  . Food insecurity - inability: Not on file  . Transportation needs - medical: Not on file  . Transportation needs - non-medical: Not on file  Occupational History  . Not on file  Tobacco Use  . Smoking status: Never Smoker  . Smokeless tobacco: Never Used  Substance and Sexual Activity  . Alcohol use: No  . Drug use: No  . Sexual activity: Not Currently  Other Topics Concern  . Not on file  Social History Narrative   Lives with dtr, moved to [place] from [facility] in 2014, widowed early 2013 (60 years married)     BP 134/68   Pulse 60   Ht 5' 3.5" (1.613 m)   Wt 126 lb (57.2 kg)   BMI 21.97 kg/m   Physical Exam:  Well appearing elderly woman, NAD HEENT: Unremarkable Neck:  6 cm JVD, no thyromegally Lymphatics:  No adenopathy Back:  No CVA tenderness Lungs:  Clear with no wheezes HEART:  Regular rate rhythm, no murmurs, no rubs,  no clicks Abd:  soft, positive bowel sounds, no organomegally, no rebound, no guarding Ext:  2 plus pulses, no edema, no cyanosis, no clubbing Skin:  No rashes no nodules Neuro:  CN II through XII intact, motor grossly intact  EKG - NSR with pacing induced LBBB  DEVICE  Normal device function.  See PaceArt for details.   Assess/Plan: 1. PPM - her boston Sci device has reached ERI. We will schedule PM gen change out. 2. HTN - her blood pressure is fairly well controlled. No change in meds.  3. PAF - she appears to be maintaining NSR. No change in meds.  Mikle Bosworth.D.

## 2017-08-12 NOTE — Patient Instructions (Addendum)
Medication Instructions:  Your physician recommends that you continue on your current medications as directed. Please refer to the Current Medication list given to you today.  Labwork: You will get lab work today:  BMP and CBC.  Testing/Procedures: Your physician has recommended that you have a pacemaker inserted. A pacemaker is a small device that is placed under the skin of your chest or abdomen to help control abnormal heart rhythms. This device uses electrical pulses to prompt the heart to beat at a normal rate. Pacemakers are used to treat heart rhythms that are too slow. Wire (leads) are attached to the pacemaker that goes into the chambers of you heart. This is done in the hospital and usually requires and overnight stay. Please see the instruction sheet given to you today for more information.  You will be scheduled for a battery change for your pacemaker.  Follow-Up: You will follow up with the device clinic 10-14 days after your procedure for a wound check.  You will follow up with Dr. Lovena Le 91 days after your procedure.  Any Other Special Instructions Will Be Listed Below (If Applicable).  Please arrive at the [facility] main entrance of [facility] at:  7:30 am on August 16, 2017. Use the CHG surgical scrub as directed. Do not eat or drink after midnight prior to procedure. You will hold your Xarelto for 2 days prior to your procedure.  Your last dose of Xarelto will be August 13, 2017.   Take your normal morning medications the AM of your procedure with a sip of water. You will not need to stay the night.   You will need someone to drive you home at discharge  If you need a refill on your cardiac medications before your next appointment, please call your pharmacy.   Pacemaker Battery Change A pacemaker battery usually lasts 5-15 years (6-7 years on average). A few times a year, you will be asked to visit your health care provider to have a full evaluation of  your pacemaker. When the battery is low, your pacemaker battery and generator will be completely replaced. Most often, this procedure is simpler than the first surgery because the wires (leads) that connect the generator to the heart are already in place. There are many things that affect how long a pacemaker battery will last, including:  The age of the pacemaker.  The number of leads you have(1, 2, or 3).  The pacemaker workload. If the pacemaker is helping the heart more often, the battery will not last as long.  Power (voltage) settings.  Tell a health care provider about:  Any allergies you have.  All medicines you are taking, including vitamins, herbs, eye drops, creams, and over-the-counter medicines.  Any problems you or family members have had with anesthetic medicines.  Any blood disorders you have.  Any surgeries you have had, especially the surgeries you have had since your last pacemaker was placed.  Any medical conditions you have.  Whether you are pregnant or may be pregnant.  Any symptoms of heart problems, such as chest pain, trouble breathing, palpitations, light-headedness, or feelings of an abnormal or irregular heartbeat.  Smoking habits. This can affect your reaction to anesthesia. What are the risks? Generally, this is a safe procedure. However, problems may occur, including:  Bleeding.  Bruising of the skin around where the surgical cut (incision) was made.  Pulling apart of the skin at the incision site.  Infection.  Nerve damage.  Injury to  other organs, such as the lungs.  Allergic reaction to anesthetics or other medicines used during the procedure.  People with diabetes may have a temporary increase in blood sugar (glucose) after any surgical procedure.  What happens before the procedure? Staying hydrated Follow instructions from your health care provider about hydration, which may include:  Up to 2 hours before the procedure - you may  continue to drink clear liquids, such as water, clear fruit juice, black coffee, and plain tea.  Eating and drinking restrictions Follow instructions from your health care provider about eating and drinking restrictions, which may include:  8 hours before the procedure - stop eating heavy meals or foods such as meat, fried foods, or fatty foods.  6 hours before the procedure - stop eating light meals or foods, such as toast or cereal.  6 hours before the procedure - stop drinking milk or drinks that contain milk.  2 hours before the procedure - stop drinking clear liquids.  General instructions  Ask your health care provider about: ? Changing or stopping your regular medicines. This is especially important if you are taking diabetes medicines or blood thinners. ? Taking medicines such as aspirin and ibuprofen. These medicines can thin your blood. Do not take these medicines before your procedure if your health care provider instructs you not to. ? Taking a sip of water with any approved medicines on the morning of the procedure.  Plan to have someone take you home after the procedure. What happens during the procedure?  To reduce your risk of infection: ? Your health care team will wash or sanitize their hands. ? The skin around the area of the chest will be washed with soap. ? Hair may be removed from the surgical area.  An IV tube will be inserted into one your veins to give you medicine and fluids.  You will be given one or more of the following: ? A medicine to help you relax (sedative). ? A medicine to numb the area where the pacemaker is located (local anesthetic).  You may be given antibiotic medicine to prevent infection.  Your health care provider will make an incision to reopen the pocket holding the pacemaker.  The old pacemaker will be disconnected from the leads.  The leads will be tested.  If needed, the leads will be replaced. If the leads are functioning  properly, the new pacemaker will be connected to the existing leads.  A heart monitor and the pacemaker programmer will be used to make sure that the newly implanted pacemaker is working properly.  The incision site will be closed. A bandage (dressing) will be placed over the pacemaker site. The procedure may vary among health care providers and hospitals. What happens after the procedure?  Your blood pressure, heart rate, breathing rate, and blood oxygen level will be monitored until your health care team is satisfied that your pacemaker is working properly.  Your health care provider will tell you when your pacemaker will need to be tested again, or when to return to the office for removal of dressing and stitches.  Do not drive for 24 hours if you were given a sedative.  The dressing will be removed 24-48 hours after the procedure, or as told by your health care provider. Summary  A pacemaker battery usually lasts 5-15 years (6-7 years on average).  When the battery is low, your pacemaker battery and generator will be completely replaced.  Risks of this procedure include bleeding, bruising, infection,  damage to other structures, pulling apart of the skin at the incision site, and allergic reactions to medicines or anesthetics.  Most often, this procedure is simpler than the first surgery because the wires (leads) that connect the generator to the heart are already in place. This information is not intended to replace advice given to you by your health care provider. Make sure you discuss any questions you have with your health care provider. Document Released: 09/30/2006 Document Revised: 05/26/2016 Document Reviewed: 05/26/2016 Elsevier Interactive Patient Education  2017 [employer].

## 2017-08-13 LAB — CBC WITH DIFFERENTIAL/PLATELET
BASOS ABS: 0 10*3/uL (ref 0.0–0.2)
Basos: 1 %
EOS (ABSOLUTE): 0.1 10*3/uL (ref 0.0–0.4)
Eos: 1 %
HEMOGLOBIN: 12.6 g/dL (ref 11.1–15.9)
Hematocrit: 38.8 % (ref 34.0–46.6)
IMMATURE GRANS (ABS): 0 10*3/uL (ref 0.0–0.1)
IMMATURE GRANULOCYTES: 0 %
LYMPHS: 25 %
Lymphocytes Absolute: 2 10*3/uL (ref 0.7–3.1)
MCH: 30 pg (ref 26.6–33.0)
MCHC: 32.5 g/dL (ref 31.5–35.7)
MCV: 92 fL (ref 79–97)
MONOCYTES: 9 %
Monocytes Absolute: 0.8 10*3/uL (ref 0.1–0.9)
NEUTROS ABS: 5.4 10*3/uL (ref 1.4–7.0)
NEUTROS PCT: 64 %
PLATELETS: 209 10*3/uL (ref 150–379)
RBC: 4.2 x10E6/uL (ref 3.77–5.28)
RDW: 14.3 % (ref 12.3–15.4)
WBC: 8.3 10*3/uL (ref 3.4–10.8)

## 2017-08-13 LAB — BASIC METABOLIC PANEL
BUN/Creatinine Ratio: 16 (ref 12–28)
BUN: 26 mg/dL (ref 8–27)
CALCIUM: 9.5 mg/dL (ref 8.7–10.3)
CHLORIDE: 102 mmol/L (ref 96–106)
CO2: 26 mmol/L (ref 20–29)
Creatinine, Ser: 1.61 mg/dL — ABNORMAL HIGH (ref 0.57–1.00)
GFR calc non Af Amer: 29 mL/min/{1.73_m2} — ABNORMAL LOW (ref >59)
GFR, EST AFRICAN AMERICAN: 33 mL/min/{1.73_m2} — AB (ref >59)
Glucose: 72 mg/dL (ref 65–99)
POTASSIUM: 5.6 mmol/L — AB (ref 3.5–5.2)
Sodium: 143 mmol/L (ref 134–144)

## 2017-08-16 ENCOUNTER — Encounter (HOSPITAL_COMMUNITY)
Admission: RE | Disposition: A | Discharge status: Home / Self Care | Payer: Self-pay | Source: Ambulatory Visit | Attending: Internal Medicine

## 2017-08-16 ENCOUNTER — Ambulatory Visit (HOSPITAL_COMMUNITY)
Admission: RE | Admit: 2017-08-16 | Discharge: 2017-08-16 | Disposition: A | Discharge status: Home / Self Care | Payer: Medicare PPO | Source: Ambulatory Visit | Attending: Internal Medicine | Admitting: Internal Medicine

## 2017-08-16 ENCOUNTER — Encounter (HOSPITAL_COMMUNITY): Payer: Self-pay | Admitting: Internal Medicine

## 2017-08-16 DIAGNOSIS — M810 Age-related osteoporosis without current pathological fracture: Secondary | ICD-10-CM | POA: Insufficient documentation

## 2017-08-16 DIAGNOSIS — F419 Anxiety disorder, unspecified: Secondary | ICD-10-CM | POA: Diagnosis not present

## 2017-08-16 DIAGNOSIS — I480 Paroxysmal atrial fibrillation: Secondary | ICD-10-CM | POA: Diagnosis not present

## 2017-08-16 DIAGNOSIS — Z950 Presence of cardiac pacemaker: Secondary | ICD-10-CM | POA: Diagnosis present

## 2017-08-16 DIAGNOSIS — Z8249 Family history of ischemic heart disease and other diseases of the circulatory system: Secondary | ICD-10-CM | POA: Diagnosis not present

## 2017-08-16 DIAGNOSIS — Z45010 Encounter for checking and testing of cardiac pacemaker pulse generator [battery]: Secondary | ICD-10-CM | POA: Diagnosis not present

## 2017-08-16 DIAGNOSIS — F329 Major depressive disorder, single episode, unspecified: Secondary | ICD-10-CM | POA: Diagnosis not present

## 2017-08-16 DIAGNOSIS — I495 Sick sinus syndrome: Secondary | ICD-10-CM | POA: Diagnosis not present

## 2017-08-16 DIAGNOSIS — Z7901 Long term (current) use of anticoagulants: Secondary | ICD-10-CM | POA: Insufficient documentation

## 2017-08-16 DIAGNOSIS — Z8673 Personal history of transient ischemic attack (TIA), and cerebral infarction without residual deficits: Secondary | ICD-10-CM | POA: Diagnosis not present

## 2017-08-16 DIAGNOSIS — I10 Essential (primary) hypertension: Secondary | ICD-10-CM | POA: Insufficient documentation

## 2017-08-16 DIAGNOSIS — I442 Atrioventricular block, complete: Secondary | ICD-10-CM | POA: Diagnosis not present

## 2017-08-16 HISTORY — PX: PPM GENERATOR CHANGEOUT: EP1233

## 2017-08-16 LAB — SURGICAL PCR SCREEN
MRSA, PCR: NEGATIVE
STAPHYLOCOCCUS AUREUS: POSITIVE — AB

## 2017-08-16 LAB — POTASSIUM: Potassium: 4.7 mmol/L (ref 3.5–5.1)

## 2017-08-16 SURGERY — PPM GENERATOR CHANGEOUT
Anesthesia: LOCAL

## 2017-08-16 MED ORDER — FENTANYL CITRATE (PF) 100 MCG/2ML IJ SOLN
INTRAMUSCULAR | Status: DC | PRN
  Administered 2017-08-16 (×2): 12.5 ug via INTRAVENOUS

## 2017-08-16 MED ORDER — CEFAZOLIN SODIUM-DEXTROSE 2-4 GM/100ML-% IV SOLN
2.0000 g | INTRAVENOUS | Status: AC
  Administered 2017-08-16: 2 g via INTRAVENOUS

## 2017-08-16 MED ORDER — CEFAZOLIN SODIUM-DEXTROSE 2-4 GM/100ML-% IV SOLN
INTRAVENOUS | Status: AC
  Filled 2017-08-16: qty 100

## 2017-08-16 MED ORDER — LIDOCAINE HCL (PF) 1 % IJ SOLN
INTRAMUSCULAR | Status: AC
  Filled 2017-08-16: qty 60

## 2017-08-16 MED ORDER — FENTANYL CITRATE (PF) 100 MCG/2ML IJ SOLN
INTRAMUSCULAR | Status: AC
  Filled 2017-08-16: qty 2

## 2017-08-16 MED ORDER — SODIUM CHLORIDE 0.9 % IV SOLN
INTRAVENOUS | Status: DC
  Administered 2017-08-16: 09:00:00 via INTRAVENOUS

## 2017-08-16 MED ORDER — ACETAMINOPHEN 325 MG PO TABS
325.0000 mg | ORAL_TABLET | ORAL | Status: DC | PRN
  Filled 2017-08-16: qty 2

## 2017-08-16 MED ORDER — ONDANSETRON HCL 4 MG/2ML IJ SOLN: 4.0000 mg | Freq: Four times a day (QID) | INTRAMUSCULAR | Status: DC | PRN

## 2017-08-16 MED ORDER — SODIUM CHLORIDE 0.9 % IR SOLN
Status: AC
  Filled 2017-08-16: qty 2

## 2017-08-16 MED ORDER — MUPIROCIN 2 % EX OINT: 1.0000 "application " | TOPICAL_OINTMENT | Freq: Once | CUTANEOUS | Status: DC

## 2017-08-16 MED ORDER — MUPIROCIN 2 % EX OINT
TOPICAL_OINTMENT | CUTANEOUS | Status: AC
  Administered 2017-08-16: 09:00:00
  Filled 2017-08-16: qty 22

## 2017-08-16 MED ORDER — CHLORHEXIDINE GLUCONATE 4 % EX LIQD: 60.0000 mL | Freq: Once | CUTANEOUS | Status: DC

## 2017-08-16 MED ORDER — MIDAZOLAM HCL 5 MG/5ML IJ SOLN
INTRAMUSCULAR | Status: DC | PRN
  Administered 2017-08-16 (×2): 1 mg via INTRAVENOUS

## 2017-08-16 MED ORDER — SODIUM CHLORIDE 0.9 % IR SOLN
80.0000 mg | Status: AC
  Administered 2017-08-16: 80 mg

## 2017-08-16 MED ORDER — LIDOCAINE HCL (PF) 1 % IJ SOLN
INTRAMUSCULAR | Status: DC | PRN
  Administered 2017-08-16: 60 mL

## 2017-08-16 MED ORDER — MIDAZOLAM HCL 5 MG/5ML IJ SOLN
INTRAMUSCULAR | Status: AC
  Filled 2017-08-16: qty 5

## 2017-08-16 SURGICAL SUPPLY — 4 items
CABLE SURGICAL S-101-97-12 (CABLE) ×2 IMPLANT
PACEMAKER ACCOLADE GR (Pacemaker) ×2 IMPLANT
PAD DEFIB LIFELINK (PAD) ×2 IMPLANT
TRAY PACEMAKER INSERTION (PACKS) ×2 IMPLANT

## 2017-08-16 NOTE — Discharge Instructions (Signed)
Pacemaker Battery Change, Care After °This sheet gives you information about how to care for yourself after your procedure. Your health care provider may also give you more specific instructions. If you have problems or questions, contact your health care provider. °What can I expect after the procedure? °After your procedure, it is common to have: °· Pain or soreness at the site where the pacemaker was inserted. °· Swelling at the site where the pacemaker was inserted. ° °Follow these instructions at home: °Incision care °· Keep the incision clean and dry. °? Do not take baths, swim, or use a hot tub until your health care provider approves. °? You may shower the day after your procedure, or as directed by your health care provider. °? Pat the area dry with a clean towel. Do not rub the area. This may cause bleeding. °· Follow instructions from your health care provider about how to take care of your incision. Make sure you: °? Wash your hands with soap and water before you change your bandage (dressing). If soap and water are not available, use hand sanitizer. °? Change your dressing as told by your health care provider. °? Leave stitches (sutures), skin glue, or adhesive strips in place. These skin closures may need to stay in place for 2 weeks or longer. If adhesive strip edges start to loosen and curl up, you may trim the loose edges. Do not remove adhesive strips completely unless your health care provider tells you to do that. °· Check your incision area every day for signs of infection. Check for: °? More redness, swelling, or pain. °? More fluid or blood. °? Warmth. °? Pus or a bad smell. °Activity °· Do not lift anything that is heavier than 10 lb (4.5 kg) until your health care provider says it is okay to do so. °· For the first 2 weeks, or as long as told by your health care provider: °? Avoid lifting your left arm higher than your shoulder. °? Be gentle when you move your arms over your head. It is okay  to raise your arm to comb your hair. °? Avoid strenuous exercise. °· Ask your health care provider when it is okay to: °? Resume your normal activities. °? Return to work or school. °? Resume sexual activity. °Eating and drinking °· Eat a heart-healthy diet. This should include plenty of fresh fruits and vegetables, whole grains, low-fat dairy products, and lean protein like chicken and fish. °· Limit alcohol intake to no more than 1 drink a day for non-pregnant women and 2 drinks a day for men. One drink equals 12 oz of beer, 5 oz of wine, or 1½ oz of hard liquor. °· Check ingredients and nutrition facts on packaged foods and beverages. Avoid the following types of food: °? Food that is high in salt (sodium). °? Food that is high in saturated fat, like full-fat dairy or red meat. °? Food that is high in trans fat, like fried food. °? Food and drinks that are high in sugar. °Lifestyle °· Do not use any products that contain nicotine or tobacco, such as cigarettes and e-cigarettes. If you need help quitting, ask your health care provider. °· Take steps to manage and control your weight. °· Get regular exercise. Aim for 150 minutes of moderate-intensity exercise (such as walking or yoga) or 75 minutes of vigorous exercise (such as running or swimming) each week. °· Manage other health problems, such as diabetes or high blood pressure. Ask your health   care provider how you can manage these conditions. General instructions  Do not drive for 24 hours after your procedure if you were given a medicine to help you relax (sedative).  Take over-the-counter and prescription medicines only as told by your health care provider.  Avoid putting pressure on the area where the pacemaker was placed.  If you need an MRI after your pacemaker has been placed, be sure to tell the health care provider who orders the MRI that you have a pacemaker.  Avoid close and prolonged exposure to electrical devices that have strong  magnetic fields. These include: ? Cell phones. Avoid keeping them in a pocket near the pacemaker, and try using the ear opposite the pacemaker. ? MP3 players. ? Household appliances, like microwaves. ? Metal detectors. ? Electric generators. ? High-tension wires.  Keep all follow-up visits as directed by your health care provider. This is important. Contact a health care provider if:  You have pain at the incision site that is not relieved by over-the-counter or prescription medicines.  You have any of these around your incision site or coming from it: ? More redness, swelling, or pain. ? Fluid or blood. ? Warmth to the touch. ? Pus or a bad smell.  You have a fever.  You feel brief, occasional palpitations, light-headedness, or any symptoms that you think might be related to your heart. Get help right away if:  You experience chest pain that is different from the pain at the pacemaker site.  You develop a red streak that extends above or below the incision site.  You experience shortness of breath.  You have palpitations or an irregular heartbeat.  You have light-headedness that does not go away quickly.  You faint or have dizzy spells.  Your pulse suddenly drops or increases rapidly and does not return to normal.  You begin to gain weight and your legs and ankles swell. Summary  After your procedure, it is common to have pain, soreness, and some swelling where the pacemaker was inserted.  Make sure to keep your incision clean and dry. Follow instructions from your health care provider about how to take care of your incision.  Check your incision every day for signs of infection, such as more pain or swelling, pus or a bad smell, warmth, or leaking fluid and blood.  Avoid strenuous exercise and lifting your left arm higher than your shoulder for 2 weeks, or as long as told by your health care provider. This information is not intended to replace advice given to you by  your health care provider. Make sure you discuss any questions you have with your health care provider. Document Released: 04/12/2013 Document Revised: 05/14/2016 Document Reviewed: 05/14/2016 Elsevier Interactive Patient Education  2017 Else[employer]. Mupirocin nasal ointment What is this medicine? MUPIROCIN CALCIUM (myoo PEER oh sin KAL see um) is an antibiotic. It is used inside the nose to treat infections that are caused by certain bacteria. This helps prevent the spread of infection to patients and health care workers during outbreaks at institutions. This medicine may be used for other purposes; ask your health care provider or pharmacist if you have questions. COMMON BRAND NAME(S): Bactroban What should I tell my health care provider before I take this medicine? They need to know if you have any of these conditions: -an unusual or allergic reaction to mupirocin, other medicines, foods, dyes, or preservatives -pregnant or trying to get pregnant -breast-feeding How should I use this medicine? This medicine is  only for use inside the nose. Follow the directions on the prescription label. Wash your hands before and after use. Squeeze half the contents of a single-use tube into one nostril, then squeeze the other half into the other nostril. Press the sides of your nose together and gently massage after application to spread the ointment throughout the nostrils. Do not use your medicine more often than directed. Finish the full course of medicine prescribed by your doctor or health care professional even if you think your condition is better. Talk to your pediatrician regarding the use of this medicine in children. Special care may be needed. Overdosage: If you think you have taken too much of this medicine contact a poison control center or emergency room at once. NOTE: This medicine is only for you. Do not share this medicine with others. What if I miss a dose? If you miss a dose, take it as  soon as you can. If it is almost time for your next dose, take only that dose. Do not take double or extra doses. What may interact with this medicine? Interactions are not expected. Do not use any other nose products without telling your doctor or health care professional. This list may not describe all possible interactions. Give your health care provider a list of all the medicines, herbs, non-prescription drugs, or dietary supplements you use. Also tell them if you smoke, drink alcohol, or use illegal drugs. Some items may interact with your medicine. What should I watch for while using this medicine? If your nose is severely irritated, burning or stinging from use of this medicine, stop using it and contact your doctor or health care professional. Do not get this medicine in your eyes. If you do, rinse out with plenty of cool tap water. What side effects may I notice from receiving this medicine? Side effects that you should report to your doctor or health care professional as soon as possible: -severe irritation, burning, stinging, or pain Side effects that usually do not require medical attention (report to your doctor or health care professional if they continue or are bothersome): -altered taste -cough -headache -skin itching -sore throat -stuffy or runny nose This list may not describe all possible side effects. Call your doctor for medical advice about side effects. You may report side effects to FDA at 1-800-FDA-1088. Where should I keep my medicine? Keep out of the reach of children. Store at room temperature between 15 and 30 degrees C (59 and 86 degrees F). Do not refrigerate. One tube of ointment is for single use in both nostrils. Throw away after use. NOTE: This sheet is a summary. It may not cover all possible information. If you have questions about this medicine, talk to your doctor, pharmacist, or health care provider.  2018 Elsevier/Gold Standard (2008-01-09  14:36:10)

## 2017-08-16 NOTE — Progress Notes (Signed)
Paged EP Amber to see when to restart xarelto// page returned, restart 2/12 evening.

## 2017-08-16 NOTE — Interval H&P Note (Signed)
History and Physical Interval Note:  08/16/2017 10:10 AM  [name]  has presented today for surgery, with the diagnosis of eri  The various methods of treatment have been discussed with the patient and family. After consideration of risks, benefits and other options for treatment, the patient has consented to  Procedure(s): PPM GENERATOR CHANGEOUT (N/A) as a surgical intervention .  The patient's history has been reviewed, patient examined, no change in status, stable for surgery.  I have reviewed the patient's chart and labs.  Questions were answered to the patient's satisfaction.     Tracy Vasquez

## 2017-08-26 ENCOUNTER — Ambulatory Visit (INDEPENDENT_AMBULATORY_CARE_PROVIDER_SITE_OTHER): Payer: Medicare PPO | Admitting: *Deleted

## 2017-08-26 DIAGNOSIS — I4891 Unspecified atrial fibrillation: Secondary | ICD-10-CM

## 2017-08-26 LAB — CUP PACEART INCLINIC DEVICE CHECK
Implantable Lead Implant Date: 20080827
Implantable Lead Location: 753860
Implantable Lead Model: 4135
Implantable Lead Serial Number: 28356762
Implantable Pulse Generator Implant Date: 20190211
Lead Channel Impedance Value: 591 Ohm
Lead Channel Pacing Threshold Amplitude: 0.7 V
Lead Channel Pacing Threshold Pulse Width: 0.4 ms
Lead Channel Sensing Intrinsic Amplitude: 20.9 mV
Lead Channel Setting Pacing Amplitude: 2 V
Lead Channel Setting Sensing Sensitivity: 2.5 mV
MDC IDC LEAD IMPLANT DT: 20080827
MDC IDC LEAD LOCATION: 753859
MDC IDC LEAD SERIAL: 28365470
MDC IDC MSMT LEADCHNL RA IMPEDANCE VALUE: 494 Ohm
MDC IDC MSMT LEADCHNL RA PACING THRESHOLD AMPLITUDE: 0.6 V
MDC IDC MSMT LEADCHNL RA SENSING INTR AMPL: 2.7 mV
MDC IDC MSMT LEADCHNL RV PACING THRESHOLD PULSEWIDTH: 0.4 ms
MDC IDC SESS DTM: 20190221050000
MDC IDC SET LEADCHNL RV PACING AMPLITUDE: 2.4 V
MDC IDC SET LEADCHNL RV PACING PULSEWIDTH: 0.4 ms
Pulse Gen Serial Number: 396317

## 2017-08-26 NOTE — Progress Notes (Signed)
Wound check appointment s/p gen change. Steri-strips removed. Wound without redness or edema. Incision edges approximated, wound well healed. Normal device function. Thresholds, sensing, and impedances consistent with implant measurements. Device programmed at chronic outputs s/p gen change. Histogram distribution appropriate for patient and level of activity. No mode switches or high ventricular rates noted. Patient educated about wound care, arm mobility, lifting restrictions. ROV with GT 11/25/17.

## 2017-09-13 ENCOUNTER — Other Ambulatory Visit: Payer: Self-pay | Admitting: Internal Medicine

## 2017-09-14 NOTE — Telephone Encounter (Signed)
Age 84 years Wt 57.2kg 08/12/2017 Saw Dr Lovena Le 08/12/2017 08/12/2017 SrCr 1.61 08/12/2017 Hgb 12.6 HCT 38.8 CrCl 22.64 Refill done for Xarelto 15 mg daily as requested

## 2017-11-25 ENCOUNTER — Encounter: Payer: Self-pay | Admitting: Internal Medicine

## 2017-11-25 ENCOUNTER — Ambulatory Visit: Payer: Medicare PPO | Admitting: Internal Medicine

## 2017-11-25 VITALS — BP 130/80 | HR 60 | Ht 63.0 in | Wt 123.2 lb

## 2017-11-25 DIAGNOSIS — I495 Sick sinus syndrome: Secondary | ICD-10-CM | POA: Diagnosis not present

## 2017-11-25 DIAGNOSIS — I5022 Chronic systolic (congestive) heart failure: Secondary | ICD-10-CM | POA: Diagnosis not present

## 2017-11-25 DIAGNOSIS — I480 Paroxysmal atrial fibrillation: Secondary | ICD-10-CM

## 2017-11-25 DIAGNOSIS — Z950 Presence of cardiac pacemaker: Secondary | ICD-10-CM | POA: Diagnosis not present

## 2017-11-25 MED ORDER — AMIODARONE HCL 200 MG PO TABS: ORAL_TABLET | ORAL | 3 refills | Status: DC

## 2017-11-25 NOTE — Progress Notes (Addendum)
HPI Tracy Vasquez returns today for followup. She is a pleasant 84 yo woman with CHB, s/p PPM insertion, HTN, and PAF. She has maintained NSR very nicely. She has undergone PM gen change and is doing wellI. No syncope.  No Known Allergies   Current Outpatient Medications  Medication Sig Dispense Refill  . digoxin (DIGITEK) 0.125 MG tablet Take 0.5 tablets (0.0625 mg total) by mouth daily. 30 tablet 3  . metoprolol tartrate (LOPRESSOR) 100 MG tablet Take 1 tablet (100 mg total) by mouth 2 (two) times daily. 180 tablet 3  . traZODone (DESYREL) 50 MG tablet Take 50 mg by mouth at bedtime as needed for sleep.     Alveda Reasons 15 MG TABS tablet TAKE 1 TABLET EVERY DAY  WITH  SUPPER 90 tablet 1  . amiodarone (PACERONE) 200 MG tablet Take one tablet mon, tues, wed, thurs, fri, sat.  DO NOT take ANY on Sunday. 90 tablet 3   No current facility-administered medications for this visit.      Past Medical History:  Diagnosis Date  . Acute gangrenous appendicitis with perforation and peritonitis 11/22/2011  . Anxiety   . Atrial fibrillation (HCC)    chronic anticoag, amio and dig  . CVA (cerebral infarction) 2008   no residual deficits  . Depression   . Hematoma 08/16/2012   spont bleed on warfarin 08/2012  . Osteoporosis     ROS:   All systems reviewed and negative except as noted in the HPI.   Past Surgical History:  Procedure Laterality Date  . INSERT / REPLACE / REMOVE PACEMAKER    . LAPAROSCOPIC APPENDECTOMY  11/22/2011   Procedure: APPENDECTOMY LAPAROSCOPIC;  Surgeon: Stark Klein, MD;  Location: [facility];  Service: General;  Laterality: N/A;  . PACEMAKER INSERTION    . PPM GENERATOR CHANGEOUT N/A 08/16/2017   Procedure: PPM GENERATOR CHANGEOUT;  Surgeon: Evans Lance, MD;  Location: [facility];  Service: Cardiovascular;  Laterality: N/A;  . RIGHT/LEFT HEART CATH AND CORONARY ANGIOGRAPHY N/A 02/01/2017   Procedure: Right/Left Heart Cath and Coronary Angiography;   Surgeon: Troy Sine, MD;  Location: [facility];  Service: Cardiovascular;  Laterality: N/A;     Family History  Problem Relation Age of Onset  . Coronary artery disease Mother   . Atrial fibrillation Father      Social History   Socioeconomic History  . Marital status: Widowed    Spouse name: Not on file  . Number of children: Not on file  . Years of education: Not on file  . Highest education level: Not on file  Occupational History  . Not on file  Social Needs  . Financial resource strain: Not on file  . Food insecurity:    Worry: Not on file    Inability: Not on file  . Transportation needs:    Medical: Not on file    Non-medical: Not on file  Tobacco Use  . Smoking status: Never Smoker  . Smokeless tobacco: Never Used  Substance and Sexual Activity  . Alcohol use: No  . Drug use: No  . Sexual activity: Not Currently  Lifestyle  . Physical activity:    Days per week: Not on file    Minutes per session: Not on file  . Stress: Not on file  Relationships  . Social connections:    Talks on phone: Not on file    Gets together: Not on file    Attends  religious service: Not on file    Active member of club or organization: Not on file    Attends meetings of clubs or organizations: Not on file    Relationship status: Not on file  . Intimate partner violence:    Fear of current or ex partner: Not on file    Emotionally abused: Not on file    Physically abused: Not on file    Forced sexual activity: Not on file  Other Topics Concern  . Not on file  Social History Narrative   Lives with dtr, moved to [place] from [facility] in 2014, widowed early 2013 (2 years married)     BP 130/80   Pulse 60   Ht 5\' 3"  (1.6 m)   Wt 123 lb 3.2 oz (55.9 kg)   BMI 21.82 kg/m   Physical Exam:  Well appearing 84 yo woman, NAD HEENT: Unremarkable Neck:  6 cm JVD, no thyromegally Lymphatics:  No adenopathy Back:  No CVA tenderness Lungs:  Clear with no  wheezes HEART:  Regular rate rhythm, no murmurs, no rubs, no clicks Abd:  soft, positive bowel sounds, no organomegally, no rebound, no guarding Ext:  2 plus pulses, no edema, no cyanosis, no clubbing Skin:  No rashes no nodules Neuro:  CN II through XII intact, motor grossly intact  EKG - NSR with LBBB and atrial pacing  DEVICE  Normal device function.  See PaceArt for details.   Assess/Plan: 1. PAF - she is maintaining NSR very nicely. I have asked her to reduce her dose of amio by taking one tablet daily but none on Sunday. 2. HTN - her blood pressure is minimally elevated. No change. 3. PPM - her [employer] DDD PM is working normally. Will follow. Mikle Bosworth.D.

## 2017-11-25 NOTE — Patient Instructions (Addendum)
Medication Instructions:  Your physician has recommended you make the following change in your medication: 1.  Reduce your amiodarone 200 mg.  Take one tablet by mouth daily Monday through Saturday.   Do NOT take AMIODARONE on Sunday.  Labwork: None ordered.  Testing/Procedures: None ordered.  Follow-Up: Your physician wants you to follow-up in: 9 months with Dr. Lovena Le.   You will receive a reminder letter in the mail two months in advance. If you don't receive a letter, please call our office to schedule the follow-up appointment.  Remote monitoring is used to monitor your Pacemaker from home. This monitoring reduces the number of office visits required to check your device to one time per year. It allows [place] to keep an eye on the functioning of your device to ensure it is working properly. You are scheduled for a device check from home on 02/24/2018. You may send your transmission at any time that day. If you have a wireless device, the transmission will be sent automatically. After your physician reviews your transmission, you will receive a postcard with your next transmission date.  Any Other Special Instructions Will Be Listed Below (If Applicable).  If you need a refill on your cardiac medications before your next appointment, please call your pharmacy.

## 2017-11-26 LAB — CUP PACEART INCLINIC DEVICE CHECK
Date Time Interrogation Session: 20190524084727
Implantable Lead Implant Date: 20080827
Implantable Lead Location: 753859
Implantable Lead Model: 4135
Implantable Lead Serial Number: 28365470
Implantable Pulse Generator Implant Date: 20190211
MDC IDC LEAD IMPLANT DT: 20080827
MDC IDC LEAD LOCATION: 753860
MDC IDC LEAD SERIAL: 28356762
MDC IDC PG SERIAL: 396317

## 2018-01-28 ENCOUNTER — Other Ambulatory Visit: Payer: Self-pay | Admitting: Internal Medicine

## 2018-01-28 MED ORDER — DIGOXIN 125 MCG PO TABS: 0.0625 mg | ORAL_TABLET | Freq: Every day | ORAL | 3 refills | Status: DC

## 2018-01-28 MED ORDER — AMIODARONE HCL 200 MG PO TABS: ORAL_TABLET | ORAL | 3 refills | Status: DC

## 2018-01-28 MED ORDER — METOPROLOL TARTRATE 100 MG PO TABS: 100.0000 mg | ORAL_TABLET | Freq: Two times a day (BID) | ORAL | 3 refills | Status: DC

## 2018-02-24 ENCOUNTER — Ambulatory Visit (INDEPENDENT_AMBULATORY_CARE_PROVIDER_SITE_OTHER): Payer: Medicare PPO | Admitting: *Deleted

## 2018-02-24 DIAGNOSIS — I495 Sick sinus syndrome: Secondary | ICD-10-CM | POA: Diagnosis not present

## 2018-02-25 ENCOUNTER — Telehealth: Payer: Self-pay

## 2018-02-25 ENCOUNTER — Encounter: Payer: Self-pay | Admitting: Cardiology

## 2018-02-25 NOTE — Progress Notes (Signed)
Remote pacemaker transmission.   

## 2018-02-25 NOTE — Telephone Encounter (Signed)
Spoke with pt and reminded pt of remote transmission that is due today. Pt verbalized understanding.   

## 2018-03-25 ENCOUNTER — Other Ambulatory Visit: Payer: Self-pay | Admitting: *Deleted

## 2018-03-25 MED ORDER — RIVAROXABAN 15 MG PO TABS: 15.0000 mg | ORAL_TABLET | Freq: Every day | ORAL | 1 refills | Status: DC

## 2018-03-25 NOTE — Telephone Encounter (Signed)
Xarelto 15mg  refill request received; pt is 84 yrs old, wt-55.9kg, Crea-1.61 on 08/12/17, CrCl-22.75ml/min, last seen by Dr. Lovena Le on 11/25/17; will send in refill to requested pharmacy.

## 2018-04-02 LAB — CUP PACEART REMOTE DEVICE CHECK
Battery Remaining Longevity: 108 mo
Brady Statistic RA Percent Paced: 99 %
Brady Statistic RV Percent Paced: 0 %
Implantable Lead Implant Date: 20080827
Implantable Lead Implant Date: 20080827
Implantable Lead Location: 753860
Implantable Lead Model: 4135
Implantable Lead Model: 4136
Implantable Lead Serial Number: 28365470
Implantable Pulse Generator Implant Date: 20190211
Lead Channel Impedance Value: 482 Ohm
Lead Channel Pacing Threshold Pulse Width: 0.4 ms
MDC IDC LEAD LOCATION: 753859
MDC IDC LEAD SERIAL: 28356762
MDC IDC MSMT LEADCHNL RV IMPEDANCE VALUE: 584 Ohm
MDC IDC MSMT LEADCHNL RV PACING THRESHOLD AMPLITUDE: 0.7 V
MDC IDC MSMT LEADCHNL RV SENSING INTR AMPL: 21.2 mV
MDC IDC PG SERIAL: 396317
MDC IDC SESS DTM: 20190928133815

## 2018-04-08 ENCOUNTER — Telehealth: Payer: Self-pay | Admitting: Internal Medicine

## 2018-04-08 NOTE — Telephone Encounter (Signed)
Spoke to patient who has some concern about the follow up letter that she received lately.  There is a paragraph in the letter that is unclear to her.  I told her that I would forward to Dr Tanna Furry nurse for further advisement.

## 2018-04-08 NOTE — Telephone Encounter (Signed)
New Message   Patient is calling because she received a letter that she needed to come in Oct to see Dr. Lovena Le.  But per the AVS in May she is not due until Feb. Please advise if Dr. Lovena Le wants patient to come sooner or not. Please call to discuss.

## 2018-04-12 NOTE — Telephone Encounter (Signed)
LVM informing pt that the letter she received stating that she is to f/u with Dr. Lovena Le in Oct. Is incorrect that she is to f/u in Feb. As per AVS from 11/2017

## 2018-05-26 ENCOUNTER — Ambulatory Visit (INDEPENDENT_AMBULATORY_CARE_PROVIDER_SITE_OTHER): Payer: Medicare PPO

## 2018-05-26 DIAGNOSIS — I495 Sick sinus syndrome: Secondary | ICD-10-CM | POA: Diagnosis not present

## 2018-05-26 NOTE — Progress Notes (Signed)
Remote pacemaker transmission.   

## 2018-05-27 ENCOUNTER — Encounter: Payer: Self-pay | Admitting: Cardiology

## 2018-07-22 LAB — CUP PACEART REMOTE DEVICE CHECK
Battery Remaining Longevity: 102 mo
Battery Remaining Percentage: 100 %
Date Time Interrogation Session: 20191121080100
Implantable Lead Implant Date: 20080827
Implantable Lead Location: 753860
Implantable Lead Model: 4135
Implantable Lead Model: 4136
Implantable Lead Serial Number: 28356762
Implantable Lead Serial Number: 28365470
Lead Channel Impedance Value: 462 Ohm
Lead Channel Impedance Value: 586 Ohm
Lead Channel Pacing Threshold Pulse Width: 0.4 ms
Lead Channel Setting Sensing Sensitivity: 2.5 mV
MDC IDC LEAD IMPLANT DT: 20080827
MDC IDC LEAD LOCATION: 753859
MDC IDC MSMT LEADCHNL RV PACING THRESHOLD AMPLITUDE: 0.8 V
MDC IDC PG IMPLANT DT: 20190211
MDC IDC PG SERIAL: 396317
MDC IDC SET LEADCHNL RA PACING AMPLITUDE: 2 V
MDC IDC SET LEADCHNL RV PACING AMPLITUDE: 2.4 V
MDC IDC SET LEADCHNL RV PACING PULSEWIDTH: 0.4 ms
MDC IDC STAT BRADY RA PERCENT PACED: 99 %
MDC IDC STAT BRADY RV PERCENT PACED: 0 %

## 2018-08-25 ENCOUNTER — Ambulatory Visit (INDEPENDENT_AMBULATORY_CARE_PROVIDER_SITE_OTHER): Payer: Medicare PPO

## 2018-08-25 DIAGNOSIS — I5022 Chronic systolic (congestive) heart failure: Secondary | ICD-10-CM

## 2018-08-25 DIAGNOSIS — I495 Sick sinus syndrome: Secondary | ICD-10-CM

## 2018-08-27 LAB — CUP PACEART REMOTE DEVICE CHECK
Brady Statistic RA Percent Paced: 100 %
Brady Statistic RV Percent Paced: 0 %
Date Time Interrogation Session: 20200220080100
Implantable Lead Implant Date: 20080827
Implantable Lead Implant Date: 20080827
Implantable Lead Location: 753859
Implantable Lead Location: 753860
Implantable Lead Model: 4135
Implantable Lead Model: 4136
Implantable Lead Serial Number: 28356762
Implantable Lead Serial Number: 28365470
Implantable Pulse Generator Implant Date: 20190211
Lead Channel Impedance Value: 465 Ohm
Lead Channel Impedance Value: 579 Ohm
Lead Channel Pacing Threshold Amplitude: 0.8 V
Lead Channel Setting Pacing Amplitude: 2 V
Lead Channel Setting Pacing Pulse Width: 0.4 ms
MDC IDC MSMT BATTERY REMAINING LONGEVITY: 102 mo
MDC IDC MSMT BATTERY REMAINING PERCENTAGE: 100 %
MDC IDC MSMT LEADCHNL RV PACING THRESHOLD PULSEWIDTH: 0.4 ms
MDC IDC SET LEADCHNL RV PACING AMPLITUDE: 2.4 V
MDC IDC SET LEADCHNL RV SENSING SENSITIVITY: 2.5 mV
Pulse Gen Serial Number: 396317

## 2018-09-01 NOTE — Progress Notes (Signed)
Remote pacemaker transmission.   

## 2018-09-02 ENCOUNTER — Encounter: Payer: Self-pay | Admitting: Cardiology

## 2018-10-03 ENCOUNTER — Other Ambulatory Visit: Payer: Self-pay | Admitting: Internal Medicine

## 2018-10-03 NOTE — Telephone Encounter (Signed)
Xarelto 15mg  refill request received; pt is 85 yrs old, wt-55.9kg, Crea-1.61 on 08/12/2017-needs updated labs, last seen by Dr. Lovena Le on 11/25/2017, CrCl-41ml/min; pt has an appt with Dr. Lovena Le on 10/06/2018 & a note was placed to obtain labs at appt. Will send in a refill at this time.

## 2018-10-04 ENCOUNTER — Telehealth: Payer: Self-pay | Admitting: Internal Medicine

## 2018-10-04 NOTE — Telephone Encounter (Signed)
Follow up  Patient would like a return call in reference to the prior message about appt.

## 2018-10-04 NOTE — Telephone Encounter (Signed)
New Message:    Pt wants to know what to do about about her 10-06-18 appt with Dr Lovena Le please?

## 2018-10-04 NOTE — Telephone Encounter (Signed)
Follow up     Called patient about 04.02.20 visit with Dr. Lovena Le. LMOM for patient to call back.

## 2018-10-06 ENCOUNTER — Telehealth (INDEPENDENT_AMBULATORY_CARE_PROVIDER_SITE_OTHER): Payer: Medicare PPO | Admitting: Internal Medicine

## 2018-10-06 ENCOUNTER — Other Ambulatory Visit: Payer: Self-pay

## 2018-10-06 DIAGNOSIS — Z950 Presence of cardiac pacemaker: Secondary | ICD-10-CM | POA: Diagnosis not present

## 2018-10-06 DIAGNOSIS — I4891 Unspecified atrial fibrillation: Secondary | ICD-10-CM | POA: Diagnosis not present

## 2018-10-06 NOTE — Progress Notes (Signed)
Electrophysiology TeleHealth Note   Due to national recommendations of social distancing due to [place] 19, an audio telehealth visit is felt to be most appropriate for this patient at this time.  See MyChart message from today for the patient's consent to telehealth for [facility].   Date:  10/06/2018   ID:  Tracy Vasquez, DOB [birth year], MRN [id number]  Location: patient's home  Provider location: [address], [place]  Evaluation Performed: Follow-up visit  PCP:  Algis Greenhouse, MD  Cardiologist:  No primary care provider on file.  Electrophysiologist:  Dr Lovena Le  Chief Complaint:  "I am doing very well"  History of Present Illness:    Tracy Vasquez is a 85 y.o. female who presents via audio/video conferencing for a telehealth visit today.  She is a pleasant 85 yo woman with a h/o CHB, s/p PPM insertion, PAF, and HTN. Since last being seen in our clinic, the patient reports doing very well.  Today, she denies symptoms of palpitations, chest pain, shortness of breath,  lower extremity edema, dizziness, presyncope, or syncope.  The patient is otherwise without complaint today.  The patient denies symptoms of fevers, chills, cough, or new SOB worrisome for COVID 19.  Past Medical History:  Diagnosis Date  . Acute gangrenous appendicitis with perforation and peritonitis 11/22/2011  . Anxiety   . Atrial fibrillation (HCC)    chronic anticoag, amio and dig  . CVA (cerebral infarction) 2008   no residual deficits  . Depression   . Hematoma 08/16/2012   spont bleed on warfarin 08/2012  . Osteoporosis     Past Surgical History:  Procedure Laterality Date  . INSERT / REPLACE / REMOVE PACEMAKER    . LAPAROSCOPIC APPENDECTOMY  11/22/2011   Procedure: APPENDECTOMY LAPAROSCOPIC;  Surgeon: Stark Klein, MD;  Location: [place];  Service: General;  Laterality: N/A;  . PACEMAKER INSERTION    . PPM GENERATOR CHANGEOUT N/A 08/16/2017   Procedure: PPM GENERATOR CHANGEOUT;   Surgeon: Evans Lance, MD;  Location: [place] [facility];  Service: Cardiovascular;  Laterality: N/A;  . RIGHT/LEFT HEART CATH AND CORONARY ANGIOGRAPHY N/A 02/01/2017   Procedure: Right/Left Heart Cath and Coronary Angiography;  Surgeon: Troy Sine, MD;  Location: [place] [facility];  Service: Cardiovascular;  Laterality: N/A;    Current Outpatient Medications  Medication Sig Dispense Refill  . amiodarone (PACERONE) 200 MG tablet Take one tablet mon, tues, wed, thurs, fri, sat.  DO NOT take ANY on Sunday. 75 tablet 3  . digoxin (DIGITEK) 0.125 MG tablet Take 0.5 tablets (0.0625 mg total) by mouth daily. 45 tablet 3  . metoprolol tartrate (LOPRESSOR) 100 MG tablet Take 1 tablet (100 mg total) by mouth 2 (two) times daily. 180 tablet 3  . traZODone (DESYREL) 50 MG tablet Take 50 mg by mouth at bedtime as needed for sleep.     Alveda Reasons 15 MG TABS tablet TAKE 1 TABLET BY MOUTH DAILY WITH SUPPER 90 tablet 0   No current facility-administered medications for this visit.     Allergies:   Patient has no known allergies.   Social History:  The patient  reports that she has never smoked. She has never used smokeless tobacco. She reports that she does not drink alcohol or use drugs.   Family History:  The patient's family history includes Atrial fibrillation in her father; Coronary artery disease in her mother.   ROS:  Please see the history of present illness.  All other systems are personally reviewed and negative.    Exam:    Vital Signs:  There were no vitals taken for this visit.  Well appearing, alert and conversant, regular work of breathing,  good skin color Eyes- anicteric, neuro- grossly intact, skin- no apparent rash or lesions or cyanosis, mouth- oral mucosa is pink   Labs/Other Tests and Data Reviewed:    Recent Labs: No results found for requested labs within last 8760 hours.   Wt Readings from Last 3 Encounters:  11/25/17 123 lb 3.2 oz (55.9 kg)  08/16/17 126 lb  (57.2 kg)  08/12/17 126 lb (57.2 kg)     Other studies personally reviewed:  Last device remote is reviewed from [place] PDF dated 08/25/18 which reveals normal device function, no arrhythmias and normal PPM function with 8.5 years of estimated battery longevity   ASSESSMENT & PLAN:    1.  CHB - she is asymptomatic, s/p PPM Insertion 2. PAF - she is maintaining NSR on low dose amiodarone. 3. Coags - she has had no bleeding on reduced dose xarelto 4. COVID 19 screen The patient denies symptoms of COVID 19 at this time.  The importance of social distancing was discussed today.  Follow-up:  10/2018 Next remote: 11/24/18  Current medicines are reviewed at length with the patient today.   The patient does not have concerns regarding her medicines.  The following changes were made today:  none  Labs/ tests ordered today include: none No orders of the defined types were placed in this encounter.    Patient Risk:  after full review of this patients clinical status, I feel that they are at moderate risk at this time.  Today, I have spent 12 minutes with the patient with telehealth technology discussing the problems as noted above. Loma Messing, MD  10/06/2018 9:22 AM     [facility] [address] [place] [address] [phone] (office) [phone] (fax)

## 2018-11-08 ENCOUNTER — Other Ambulatory Visit: Payer: Self-pay | Admitting: Internal Medicine

## 2018-11-08 MED ORDER — DIGOXIN 125 MCG PO TABS: 0.0625 mg | ORAL_TABLET | Freq: Every day | ORAL | 3 refills | Status: DC

## 2018-11-08 MED ORDER — METOPROLOL TARTRATE 100 MG PO TABS: 100.0000 mg | ORAL_TABLET | Freq: Two times a day (BID) | ORAL | 3 refills | Status: DC

## 2018-11-24 ENCOUNTER — Ambulatory Visit (INDEPENDENT_AMBULATORY_CARE_PROVIDER_SITE_OTHER): Payer: Medicare PPO | Admitting: *Deleted

## 2018-11-24 DIAGNOSIS — I495 Sick sinus syndrome: Secondary | ICD-10-CM

## 2018-11-24 LAB — CUP PACEART REMOTE DEVICE CHECK
Battery Remaining Longevity: 96 mo
Battery Remaining Percentage: 100 %
Brady Statistic RA Percent Paced: 99 %
Brady Statistic RV Percent Paced: 0 %
Date Time Interrogation Session: 20200521070100
Implantable Lead Implant Date: 20080827
Implantable Lead Implant Date: 20080827
Implantable Lead Location: 753859
Implantable Lead Location: 753860
Implantable Lead Model: 4135
Implantable Lead Model: 4136
Implantable Lead Serial Number: 28356762
Implantable Lead Serial Number: 28365470
Implantable Pulse Generator Implant Date: 20190211
Lead Channel Impedance Value: 458 Ohm
Lead Channel Impedance Value: 568 Ohm
Lead Channel Pacing Threshold Amplitude: 0.8 V
Lead Channel Pacing Threshold Pulse Width: 0.4 ms
Lead Channel Setting Pacing Amplitude: 2 V
Lead Channel Setting Pacing Amplitude: 2.4 V
Lead Channel Setting Pacing Pulse Width: 0.4 ms
Lead Channel Setting Sensing Sensitivity: 2.5 mV
Pulse Gen Serial Number: 396317

## 2018-12-05 NOTE — Progress Notes (Signed)
Remote pacemaker transmission.   

## 2018-12-16 ENCOUNTER — Other Ambulatory Visit: Payer: Self-pay | Admitting: Internal Medicine

## 2018-12-30 ENCOUNTER — Other Ambulatory Visit: Payer: Self-pay | Admitting: Internal Medicine

## 2018-12-30 DIAGNOSIS — I482 Chronic atrial fibrillation, unspecified: Secondary | ICD-10-CM

## 2018-12-30 NOTE — Telephone Encounter (Signed)
Pt returned a call to the scheduling dept and a message was sent to me from [place] the scheduler to call the pt. Also, messaged Sonia Baller the Nurse for Dr. Lovena Le and discussed sending in a refill for the pt since she needs labs and the plan is to send in a couple months supply for the pt and set the pt up for a lab appt in a couple months since [place] 19 pandemic.  Pt was unavailable again and left a detailed message again for her to call [phone] and not the number that comes up on her caller ID, gave the number 3 times and slowly to ensure she got all the digits, will await a call back.   Pt returned a call to the scheduling dept and a message was sent to me from [place] the scheduler to call the pt. Also, messaged Sonia Baller the Nurse for Dr. Lovena Le and discussed sending in a refill for the pt since she needs labs and the plan is to send in a couple months supply for the pt and set the pt up for a lab appt in a couple months since [place] 19 pandemic.  Pt was unavailable again and left a detailed message again for her to call [phone] and not the number that comes up on her caller ID, gave the number 3 times and slowly to ensure she got all the digits, will await a call back.   Pt updated and refill sent in for a 3 month supply and lab appt made. Please refer to refill encounter for Xarelto.

## 2018-12-30 NOTE — Telephone Encounter (Signed)
Pt calling requesting a refill on Xarelto. Please address

## 2018-12-30 NOTE — Telephone Encounter (Signed)
No message needed °

## 2018-12-30 NOTE — Telephone Encounter (Signed)
New message    Patient states that she is returning call about refill.

## 2018-12-30 NOTE — Telephone Encounter (Signed)
Pt returned a call to the scheduling dept and a message was sent to me from [place] the scheduler to call the pt. Called the pt back and had to leave another message.   Also, messaged Sonia Baller the Nurse for Dr. Lovena Le and discussed sending in a refill for the pt since she needs labs and the plan is to send in a couple months supply for the pt and set the pt up for a lab appt in a couple months since we are in a COVID 19 pandemic.   Pt was unavailable again and left a detailed message again for her to call [phone] and not the number that comes up on her caller ID, gave the number 3 times and slowly to ensure she got all the digits, will await a call back.   Pt called back and the pt is fine with a 3 month supply being sent in and scheduled her for labs in September in hopes that the pandemic curve flattens. Pt also, states she does not have a PCP anymore and she is willing to drive to [place]. Pt is looking to find a PCP and will provide her a PCP list that has some Providers that are accepting new pts and will mail to her. Refill will be sent at the close of this enocounter.

## 2018-12-30 NOTE — Telephone Encounter (Signed)
I do not see any notes where someone called pt about refill. I will route to both the refill dept and Dr. Tanna Furry nurse.

## 2018-12-30 NOTE — Telephone Encounter (Signed)
Xarelto 15mg  refill request received; pt is 85 yrs old, wt-55.9kg, Crea-1.61 on 08/12/2017-needs updated labs (called PCP and they have not done any since 2018), last Telemedicine with Dr. Lovena Le on 10/06/2018,CrCl-21.58ml/min. Pt is on correct dose, however she  needs some current labs. Left a message for her to call back to discuss if she has been at another facility and had labs drawn or if she will be able to set a date in the future to have them done as we are still in the [place] 19 pandemic and left our direct number for her to callback. Will await a call back.

## 2019-02-23 ENCOUNTER — Ambulatory Visit (INDEPENDENT_AMBULATORY_CARE_PROVIDER_SITE_OTHER): Payer: Medicare PPO | Admitting: *Deleted

## 2019-02-23 DIAGNOSIS — I5022 Chronic systolic (congestive) heart failure: Secondary | ICD-10-CM | POA: Diagnosis not present

## 2019-02-23 DIAGNOSIS — I482 Chronic atrial fibrillation, unspecified: Secondary | ICD-10-CM

## 2019-02-26 LAB — CUP PACEART REMOTE DEVICE CHECK
Battery Remaining Longevity: 96 mo
Battery Remaining Percentage: 100 %
Brady Statistic RA Percent Paced: 99 %
Brady Statistic RV Percent Paced: 0 %
Date Time Interrogation Session: 20200821195600
Implantable Lead Implant Date: 20080827
Implantable Lead Implant Date: 20080827
Implantable Lead Location: 753859
Implantable Lead Location: 753860
Implantable Lead Model: 4135
Implantable Lead Model: 4136
Implantable Lead Serial Number: 28356762
Implantable Lead Serial Number: 28365470
Implantable Pulse Generator Implant Date: 20190211
Lead Channel Impedance Value: 473 Ohm
Lead Channel Impedance Value: 567 Ohm
Lead Channel Pacing Threshold Amplitude: 0.8 V
Lead Channel Pacing Threshold Pulse Width: 0.4 ms
Lead Channel Setting Pacing Amplitude: 2 V
Lead Channel Setting Pacing Amplitude: 2.4 V
Lead Channel Setting Pacing Pulse Width: 0.4 ms
Lead Channel Setting Sensing Sensitivity: 2.5 mV
Pulse Gen Serial Number: 396317

## 2019-03-03 NOTE — Progress Notes (Signed)
Remote pacemaker transmission.   

## 2019-03-14 ENCOUNTER — Other Ambulatory Visit: Payer: Medicare PPO

## 2019-03-31 ENCOUNTER — Other Ambulatory Visit: Payer: Self-pay | Admitting: Internal Medicine

## 2019-03-31 NOTE — Telephone Encounter (Signed)
Prescription request for Xarelto received. Pt is overdue for lab work. Called pt and LMOM for pt to call back.

## 2019-04-03 NOTE — Telephone Encounter (Signed)
Pt is overdue for blood work called pt and left message for pt to call back.

## 2019-04-04 NOTE — Telephone Encounter (Signed)
Prescription refill request received for Xaralto. Pt is overdue for blood work. Called pt and LMOM.

## 2019-04-04 NOTE — Telephone Encounter (Signed)
Prescription refill request for Xarelto received.   Last office visit: Tracy Vasquez (10-06-2018) Weight: 55.9 kg Age: 85 y.o. Scr: 1.61 (08-12-2017) CrCl:22 ml/min  Pt overdue for lab work. Pt called back and was able to schedule an appointment for labs. Pt will run out of Xarelto on 04/06/2019. Will refill a 1 month supply.

## 2019-04-07 ENCOUNTER — Other Ambulatory Visit: Payer: Self-pay

## 2019-04-07 ENCOUNTER — Other Ambulatory Visit: Payer: Medicare PPO | Admitting: *Deleted

## 2019-04-07 DIAGNOSIS — I482 Chronic atrial fibrillation, unspecified: Secondary | ICD-10-CM

## 2019-04-08 LAB — BASIC METABOLIC PANEL
BUN/Creatinine Ratio: 17 (ref 12–28)
BUN: 25 mg/dL (ref 8–27)
CO2: 23 mmol/L (ref 20–29)
Calcium: 9.8 mg/dL (ref 8.7–10.3)
Chloride: 106 mmol/L (ref 96–106)
Creatinine, Ser: 1.47 mg/dL — ABNORMAL HIGH (ref 0.57–1.00)
GFR calc Af Amer: 37 mL/min/{1.73_m2} — ABNORMAL LOW (ref >59)
GFR calc non Af Amer: 32 mL/min/{1.73_m2} — ABNORMAL LOW (ref >59)
Glucose: 86 mg/dL (ref 65–99)
Potassium: 5.6 mmol/L — ABNORMAL HIGH (ref 3.5–5.2)
Sodium: 142 mmol/L (ref 134–144)

## 2019-04-08 LAB — CBC
Hematocrit: 39.8 % (ref 34.0–46.6)
Hemoglobin: 12.9 g/dL (ref 11.1–15.9)
MCH: 30.2 pg (ref 26.6–33.0)
MCHC: 32.4 g/dL (ref 31.5–35.7)
MCV: 93 fL (ref 79–97)
Platelets: 211 10*3/uL (ref 150–450)
RBC: 4.27 x10E6/uL (ref 3.77–5.28)
RDW: 12.7 % (ref 11.7–15.4)
WBC: 9.1 10*3/uL (ref 3.4–10.8)

## 2019-04-19 ENCOUNTER — Other Ambulatory Visit: Payer: Self-pay | Admitting: Internal Medicine

## 2019-04-19 NOTE — Telephone Encounter (Signed)
° ° ° °*  STAT* If patient is at the pharmacy, call can be transferred to refill team.   1. Which medications need to be refilled? (please list name of each medication and dose if known) XARELTO 15 MG TABS tablet  2. Which pharmacy/location (including street and city if local pharmacy) is medication to be sent to?[id number] - [place], [place] - [address] AT [address]  3. Do they need a 30 day or 90 day supply? [place]

## 2019-04-20 MED ORDER — RIVAROXABAN 15 MG PO TABS
ORAL_TABLET | ORAL | 5 refills | Status: DC
Start: 2019-04-20 — End: 2019-10-04

## 2019-04-20 NOTE — Telephone Encounter (Signed)
Scr 1.47 on 04/07/2019 ABW crcl =23.7 ml/min Last OV 10/06/2018 Will send Xarelto 15mg  daily to pharmacy

## 2019-05-25 ENCOUNTER — Ambulatory Visit (INDEPENDENT_AMBULATORY_CARE_PROVIDER_SITE_OTHER): Payer: Medicare PPO | Admitting: *Deleted

## 2019-05-25 DIAGNOSIS — I482 Chronic atrial fibrillation, unspecified: Secondary | ICD-10-CM

## 2019-05-25 DIAGNOSIS — I5022 Chronic systolic (congestive) heart failure: Secondary | ICD-10-CM | POA: Diagnosis not present

## 2019-05-25 LAB — CUP PACEART REMOTE DEVICE CHECK
Battery Remaining Longevity: 90 mo
Battery Remaining Percentage: 100 %
Brady Statistic RA Percent Paced: 99 %
Brady Statistic RV Percent Paced: 0 %
Date Time Interrogation Session: 20201119030100
Implantable Lead Implant Date: 20080827
Implantable Lead Implant Date: 20080827
Implantable Lead Location: 753859
Implantable Lead Location: 753860
Implantable Lead Model: 4135
Implantable Lead Model: 4136
Implantable Lead Serial Number: 28356762
Implantable Lead Serial Number: 28365470
Implantable Pulse Generator Implant Date: 20190211
Lead Channel Impedance Value: 471 Ohm
Lead Channel Impedance Value: 583 Ohm
Lead Channel Pacing Threshold Amplitude: 0.7 V
Lead Channel Pacing Threshold Pulse Width: 0.4 ms
Lead Channel Setting Pacing Amplitude: 2 V
Lead Channel Setting Pacing Amplitude: 2.4 V
Lead Channel Setting Pacing Pulse Width: 0.4 ms
Lead Channel Setting Sensing Sensitivity: 2.5 mV
Pulse Gen Serial Number: 396317

## 2019-06-23 NOTE — Progress Notes (Signed)
Remote pacemaker transmission.   

## 2019-07-12 ENCOUNTER — Other Ambulatory Visit: Payer: Self-pay | Admitting: Internal Medicine

## 2019-08-21 ENCOUNTER — Other Ambulatory Visit: Payer: Self-pay | Admitting: Internal Medicine

## 2019-08-24 ENCOUNTER — Ambulatory Visit (INDEPENDENT_AMBULATORY_CARE_PROVIDER_SITE_OTHER): Payer: Medicare PPO | Admitting: *Deleted

## 2019-08-24 DIAGNOSIS — I5022 Chronic systolic (congestive) heart failure: Secondary | ICD-10-CM

## 2019-08-24 LAB — CUP PACEART REMOTE DEVICE CHECK
Battery Remaining Longevity: 90 mo
Battery Remaining Percentage: 100 %
Brady Statistic RA Percent Paced: 98 %
Brady Statistic RV Percent Paced: 1 %
Date Time Interrogation Session: 20210218030100
Implantable Lead Implant Date: 20080827
Implantable Lead Implant Date: 20080827
Implantable Lead Location: 753859
Implantable Lead Location: 753860
Implantable Lead Model: 4135
Implantable Lead Model: 4136
Implantable Lead Serial Number: 28356762
Implantable Lead Serial Number: 28365470
Implantable Pulse Generator Implant Date: 20190211
Lead Channel Impedance Value: 478 Ohm
Lead Channel Impedance Value: 589 Ohm
Lead Channel Pacing Threshold Amplitude: 0.8 V
Lead Channel Pacing Threshold Pulse Width: 0.4 ms
Lead Channel Setting Pacing Amplitude: 2 V
Lead Channel Setting Pacing Amplitude: 2.4 V
Lead Channel Setting Pacing Pulse Width: 0.4 ms
Lead Channel Setting Sensing Sensitivity: 2.5 mV
Pulse Gen Serial Number: 396317

## 2019-08-24 NOTE — Progress Notes (Signed)
PPM Remote  

## 2019-09-11 ENCOUNTER — Other Ambulatory Visit: Payer: Self-pay | Admitting: Internal Medicine

## 2019-10-04 ENCOUNTER — Other Ambulatory Visit: Payer: Self-pay

## 2019-10-04 MED ORDER — AMIODARONE HCL 200 MG PO TABS: ORAL_TABLET | ORAL | 0 refills | Status: DC

## 2019-10-04 NOTE — Telephone Encounter (Signed)
Please send refill to [facility] mail delivery

## 2019-10-05 MED ORDER — RIVAROXABAN 15 MG PO TABS: ORAL_TABLET | ORAL | 1 refills | Status: DC

## 2019-10-05 NOTE — Telephone Encounter (Signed)
Pt last saw Dr Lovena Le 10/06/18 telemedicine Covid-19, pt has upcoming appt scheduled with Dr Lovena Le on 10/20/19, last labs 04/07/19 Creat 1.47, age 86, weight 55.9kg, CrCl 23.34, based on CrCl pt is on appropriate dosage of Xarelto 15mg  QD.  Will refill rx.

## 2019-10-09 ENCOUNTER — Other Ambulatory Visit: Payer: Self-pay

## 2019-10-12 ENCOUNTER — Other Ambulatory Visit: Payer: Self-pay

## 2019-10-12 MED ORDER — AMIODARONE HCL 200 MG PO TABS: ORAL_TABLET | ORAL | 0 refills | Status: DC

## 2019-10-13 MED ORDER — DIGOXIN 125 MCG PO TABS: 62.5000 ug | ORAL_TABLET | Freq: Every day | ORAL | 3 refills | Status: DC

## 2019-10-13 MED ORDER — METOPROLOL TARTRATE 100 MG PO TABS: 100.0000 mg | ORAL_TABLET | Freq: Two times a day (BID) | ORAL | 3 refills | Status: DC

## 2019-10-13 NOTE — Telephone Encounter (Signed)
Received phone message from [facility] requesting Pt refills for metoprolol and digoxin.  Refilled as requested.

## 2019-10-13 NOTE — Addendum Note (Signed)
Addended by: Willeen Cass A on: 10/13/2019 01:56 PM   Modules accepted: Orders

## 2019-10-20 ENCOUNTER — Other Ambulatory Visit: Payer: Self-pay

## 2019-10-20 ENCOUNTER — Ambulatory Visit: Payer: Medicare PPO | Admitting: Internal Medicine

## 2019-10-20 ENCOUNTER — Encounter: Payer: Self-pay | Admitting: Internal Medicine

## 2019-10-20 VITALS — BP 122/80 | HR 62 | Ht 62.5 in | Wt 130.8 lb

## 2019-10-20 DIAGNOSIS — I480 Paroxysmal atrial fibrillation: Secondary | ICD-10-CM | POA: Diagnosis not present

## 2019-10-20 DIAGNOSIS — Z950 Presence of cardiac pacemaker: Secondary | ICD-10-CM

## 2019-10-20 NOTE — Progress Notes (Signed)
HPI [name] returns today for followup. She is a pleasant 86 yo woman with a h/o CHB, s/p PPM insertion, PAF, and HTN. Since I saw her remotely a year ago, she notes that she feels well with no chest pain or sob. She is in the process of getting a Covid vaccination. No Known Allergies   Current Outpatient Medications  Medication Sig Dispense Refill  . amiodarone (PACERONE) 200 MG tablet TAKE 1 TABLET ON MONDAY, TUESDAY, WEDNESDAY, THURSDAY, FRIDAY, AND SATURDAY. DO NOT TAKE ANY ON Sunday. Please keep upcoming appt in April with Dr. Lovena Le. Thank you 90 tablet 0  . digoxin (LANOXIN) 0.125 MG tablet Take 0.5 tablets (62.5 mcg total) by mouth daily. 45 tablet 3  . metoprolol tartrate (LOPRESSOR) 100 MG tablet Take 1 tablet (100 mg total) by mouth 2 (two) times daily. Please make yearly appt with Dr. Lovena Le for April for future refills. 1st attempt 180 tablet 3  . Rivaroxaban (XARELTO) 15 MG TABS tablet TAKE 1 TABLET BY MOUTH DAILY WITH SUPPER 90 tablet 1   No current facility-administered medications for this visit.     Past Medical History:  Diagnosis Date  . Acute gangrenous appendicitis with perforation and peritonitis 11/22/2011  . Anxiety   . Atrial fibrillation (HCC)    chronic anticoag, amio and dig  . CVA (cerebral infarction) 2008   no residual deficits  . Depression   . Hematoma 08/16/2012   spont bleed on warfarin 08/2012  . Osteoporosis     ROS:   All systems reviewed and negative except as noted in the HPI.   Past Surgical History:  Procedure Laterality Date  . INSERT / REPLACE / REMOVE PACEMAKER    . LAPAROSCOPIC APPENDECTOMY  11/22/2011   Procedure: APPENDECTOMY LAPAROSCOPIC;  Surgeon: Stark Klein, MD;  Location: [facility];  Service: General;  Laterality: N/A;  . PACEMAKER INSERTION    . PPM GENERATOR CHANGEOUT N/A 08/16/2017   Procedure: PPM GENERATOR CHANGEOUT;  Surgeon: Evans Lance, MD;  Location: [place] [facility];  Service: Cardiovascular;   Laterality: N/A;  . RIGHT/LEFT HEART CATH AND CORONARY ANGIOGRAPHY N/A 02/01/2017   Procedure: Right/Left Heart Cath and Coronary Angiography;  Surgeon: Troy Sine, MD;  Location: [place] [facility];  Service: Cardiovascular;  Laterality: N/A;     Family History  Problem Relation Age of Onset  . Coronary artery disease Mother   . Atrial fibrillation Father      Social History   Socioeconomic History  . Marital status: Widowed    Spouse name: Not on file  . Number of children: Not on file  . Years of education: Not on file  . Highest education level: Not on file  Occupational History  . Not on file  Tobacco Use  . Smoking status: Never Smoker  . Smokeless tobacco: Never Used  Substance and Sexual Activity  . Alcohol use: No  . Drug use: No  . Sexual activity: Not Currently  Other Topics Concern  . Not on file  Social History Narrative   Lives with dtr, moved to [place] from [facility] in 2014, widowed early 2013 (59 years married)   Social Determinants of Health   Financial Resource Strain:   . Difficulty of Paying Living Expenses:   Food Insecurity:   . Worried About Charity fundraiser in the Last Year:   . Arboriculturist in the Last Year:   Transportation Needs:   . Lack  of Transportation (Medical):   Marland Kitchen Lack of Transportation (Non-Medical):   Physical Activity:   . Days of Exercise per Week:   . Minutes of Exercise per Session:   Stress:   . Feeling of Stress :   Social Connections:   . Frequency of Communication with Friends and Family:   . Frequency of Social Gatherings with Friends and Family:   . Attends Religious Services:   . Active Member of Clubs or Organizations:   . Attends Archivist Meetings:   Marland Kitchen Marital Status:   Intimate Partner Violence:   . Fear of Current or Ex-Partner:   . Emotionally Abused:   Marland Kitchen Physically Abused:   . Sexually Abused:      BP 122/80   Pulse 62   Ht 5' 2.5" (1.588 m)   Wt 130 lb 12.8 oz (59.3 kg)    SpO2 93%   BMI 23.54 kg/m   Physical Exam:  Well appearing NAD HEENT: Unremarkable Neck:  No JVD, no thyromegally Lymphatics:  No adenopathy Back:  No CVA tenderness Lungs:  Clear with no wheezes HEART:  Regular rate rhythm, no murmurs, no rubs, no clicks Abd:  soft, positive bowel sounds, no organomegally, no rebound, no guarding Ext:  2 plus pulses, no edema, no cyanosis, no clubbing Skin:  No rashes no nodules Neuro:  CN II through XII intact, motor grossly intact  EKG - nsr  DEVICE  Normal device function.  See PaceArt for details.   Assess/Plan: 1. Sinus node dysfunction - she is pacing in the atrium much of the time. Asymptomatic. 2. PPM - her b[employer] Sci ddd PPM is working normally. 3. PAf - she is maintaining NSR. She will continue her systemic anti-coagulation. 4. HTN - her sbp is well controlled. We will follow.  [email or web address].

## 2019-10-20 NOTE — Patient Instructions (Signed)
Medication Instructions:  Your physician recommends that you continue on your current medications as directed. Please refer to the Current Medication list given to you today.  *If you need a refill on your cardiac medications before your next appointment, please call your pharmacy*   Lab Work:  If you have labs (blood work) drawn today and your tests are completely normal, you will receive your results only by: Marland Kitchen MyChart Message (if you have MyChart) OR . A paper copy in the mail If you have any lab test that is abnormal or we need to change your treatment, we will call you to review the results.   Testing/Procedures:    Follow-Up: At [facility], you and your health needs are our priority.  As part of our continuing mission to provide you with exceptional heart care, we have created designated Provider Care Teams.  These Care Teams include your primary Cardiologist (physician) and Advanced Practice Providers (APPs -  Physician Assistants and Nurse Practitioners) who all work together to provide you with the care you need, when you need it.  We recommend signing up for the patient portal called "MyChart".  Sign up information is provided on this After Visit Summary.  MyChart is used to connect with patients for Virtual Visits (Telemedicine).  Patients are able to view lab/test results, encounter notes, upcoming appointments, etc.  Non-urgent messages can be sent to your provider as well.   To learn more about what you can do with MyChart, go to [email or web address].    Your next appointment:   1 year(s)  The format for your next appointment:   In Person  Provider:   Cristopher Peru, MD   Other Instructions

## 2019-10-24 NOTE — Addendum Note (Signed)
Addended by: Rose Phi on: 10/24/2019 11:35 AM   Modules accepted: Orders

## 2019-10-25 LAB — CUP PACEART INCLINIC DEVICE CHECK
Brady Statistic RA Percent Paced: 98 %
Brady Statistic RV Percent Paced: 1 % — CL
Date Time Interrogation Session: 20210416153609
Implantable Lead Implant Date: 20080827
Implantable Lead Implant Date: 20080827
Implantable Lead Location: 753859
Implantable Lead Location: 753860
Implantable Lead Model: 4135
Implantable Lead Model: 4136
Implantable Lead Serial Number: 28356762
Implantable Lead Serial Number: 28365470
Implantable Pulse Generator Implant Date: 20190211
Lead Channel Impedance Value: 492 Ohm
Lead Channel Impedance Value: 593 Ohm
Lead Channel Pacing Threshold Amplitude: 0.6 V
Lead Channel Pacing Threshold Amplitude: 0.7 V
Lead Channel Pacing Threshold Pulse Width: 0.4 ms
Lead Channel Pacing Threshold Pulse Width: 0.4 ms
Lead Channel Sensing Intrinsic Amplitude: 21.8 mV
Lead Channel Sensing Intrinsic Amplitude: 3.6 mV
Lead Channel Setting Pacing Amplitude: 2 V
Lead Channel Setting Pacing Amplitude: 2.4 V
Lead Channel Setting Pacing Pulse Width: 0.4 ms
Lead Channel Setting Sensing Sensitivity: 2.5 mV
Pulse Gen Serial Number: 396317

## 2019-11-23 ENCOUNTER — Ambulatory Visit (INDEPENDENT_AMBULATORY_CARE_PROVIDER_SITE_OTHER): Payer: Medicare PPO | Admitting: *Deleted

## 2019-11-23 DIAGNOSIS — I495 Sick sinus syndrome: Secondary | ICD-10-CM

## 2019-11-23 LAB — CUP PACEART REMOTE DEVICE CHECK
Battery Remaining Longevity: 60 mo
Battery Remaining Percentage: 97 %
Brady Statistic RA Percent Paced: 3 %
Brady Statistic RV Percent Paced: 81 %
Date Time Interrogation Session: 20210520031400
Implantable Lead Implant Date: 20080827
Implantable Lead Implant Date: 20080827
Implantable Lead Location: 753859
Implantable Lead Location: 753860
Implantable Lead Model: 4135
Implantable Lead Model: 4136
Implantable Lead Serial Number: 28356762
Implantable Lead Serial Number: 28365470
Implantable Pulse Generator Implant Date: 20190211
Lead Channel Impedance Value: 470 Ohm
Lead Channel Impedance Value: 606 Ohm
Lead Channel Pacing Threshold Amplitude: 0.8 V
Lead Channel Pacing Threshold Pulse Width: 0.4 ms
Lead Channel Setting Pacing Amplitude: 2 V
Lead Channel Setting Pacing Amplitude: 2.4 V
Lead Channel Setting Pacing Pulse Width: 0.4 ms
Lead Channel Setting Sensing Sensitivity: 2.5 mV
Pulse Gen Serial Number: 396317

## 2019-11-27 NOTE — Progress Notes (Signed)
Remote pacemaker transmission.   

## 2019-12-11 ENCOUNTER — Other Ambulatory Visit: Payer: Self-pay | Admitting: Internal Medicine

## 2019-12-13 MED ORDER — AMIODARONE HCL 200 MG PO TABS: ORAL_TABLET | ORAL | 2 refills | Status: DC

## 2020-02-22 ENCOUNTER — Ambulatory Visit (INDEPENDENT_AMBULATORY_CARE_PROVIDER_SITE_OTHER): Payer: Medicare PPO | Admitting: *Deleted

## 2020-02-22 DIAGNOSIS — I495 Sick sinus syndrome: Secondary | ICD-10-CM | POA: Diagnosis not present

## 2020-02-22 LAB — CUP PACEART REMOTE DEVICE CHECK
Battery Remaining Longevity: 54 mo
Battery Remaining Percentage: 91 %
Brady Statistic RA Percent Paced: 1 %
Brady Statistic RV Percent Paced: 88 %
Date Time Interrogation Session: 20210819032700
Implantable Lead Implant Date: 20080827
Implantable Lead Implant Date: 20080827
Implantable Lead Location: 753859
Implantable Lead Location: 753860
Implantable Lead Model: 4135
Implantable Lead Model: 4136
Implantable Lead Serial Number: 28356762
Implantable Lead Serial Number: 28365470
Implantable Pulse Generator Implant Date: 20190211
Lead Channel Impedance Value: 485 Ohm
Lead Channel Impedance Value: 588 Ohm
Lead Channel Pacing Threshold Amplitude: 0.6 V
Lead Channel Pacing Threshold Pulse Width: 0.4 ms
Lead Channel Setting Pacing Amplitude: 2 V
Lead Channel Setting Pacing Amplitude: 2.4 V
Lead Channel Setting Pacing Pulse Width: 0.4 ms
Lead Channel Setting Sensing Sensitivity: 2.5 mV
Pulse Gen Serial Number: 396317

## 2020-02-23 ENCOUNTER — Other Ambulatory Visit: Payer: Self-pay | Admitting: Internal Medicine

## 2020-02-23 NOTE — Telephone Encounter (Signed)
Prescription refill request for Xarelto received.   Last office visit:Tracy Vasquez, 10/20/2019 Weight: 59.3 kg Age: 86 y.o. Scr: 1.47, 04/07/2019 CrCl: 24 ml/min   Prescription refill sent.

## 2020-02-26 NOTE — Progress Notes (Signed)
Remote pacemaker transmission.   

## 2020-03-14 ENCOUNTER — Telehealth: Payer: Self-pay | Admitting: Internal Medicine

## 2020-03-14 NOTE — Telephone Encounter (Signed)
Patient states that she has had her covid vaccine but is concerned about having to get the booster shot. She wants to know if it is safe beings that she has afib. Please advise.

## 2020-03-15 NOTE — Telephone Encounter (Signed)
Patient is due for her booster shot tomorrow and would like to hear back today on whether or not she should take it.

## 2020-03-15 NOTE — Telephone Encounter (Signed)
Left detailed message informing pt ok to take booster shot, per Dr. Lovena Le. (DPR on file) Advised to call back w/ further questions.

## 2020-03-27 ENCOUNTER — Ambulatory Visit (INDEPENDENT_AMBULATORY_CARE_PROVIDER_SITE_OTHER): Payer: Medicare PPO | Admitting: Legal Medicine

## 2020-03-27 ENCOUNTER — Other Ambulatory Visit: Payer: Self-pay

## 2020-03-27 ENCOUNTER — Encounter: Payer: Self-pay | Admitting: Legal Medicine

## 2020-03-27 VITALS — BP 130/60 | HR 76 | Temp 97.1°F | Resp 16 | Ht 61.0 in | Wt 120.0 lb

## 2020-03-27 DIAGNOSIS — M810 Age-related osteoporosis without current pathological fracture: Secondary | ICD-10-CM

## 2020-03-27 DIAGNOSIS — N184 Chronic kidney disease, stage 4 (severe): Secondary | ICD-10-CM

## 2020-03-27 DIAGNOSIS — I482 Chronic atrial fibrillation, unspecified: Secondary | ICD-10-CM | POA: Diagnosis not present

## 2020-03-27 DIAGNOSIS — E782 Mixed hyperlipidemia: Secondary | ICD-10-CM

## 2020-03-27 DIAGNOSIS — I700 Atherosclerosis of aorta: Secondary | ICD-10-CM

## 2020-03-27 DIAGNOSIS — I5022 Chronic systolic (congestive) heart failure: Secondary | ICD-10-CM | POA: Diagnosis not present

## 2020-03-27 DIAGNOSIS — R109 Unspecified abdominal pain: Secondary | ICD-10-CM

## 2020-03-27 DIAGNOSIS — G8929 Other chronic pain: Secondary | ICD-10-CM

## 2020-03-27 DIAGNOSIS — Z7901 Long term (current) use of anticoagulants: Secondary | ICD-10-CM

## 2020-03-27 NOTE — Progress Notes (Signed)
New Patient Office Visit  Subjective:  Patient ID: Tracy Vasquez, female    DOB: 31-Dec-1929  Age: 86 y.o. MRN: [id number]  CC:  Chief Complaint  Patient presents with  . New Patient (Initial Visit)    Patient wants to establish a new PCP.  Marland Kitchen Abdominal Pain    Patient has crampin pain and bloating since 6 weeks ago.    HPI Iysis Germain presents for new patient visit.  Patient is getting lower abdominal pain with gas for one month.  Relieved by bowel movent. No mucous or blood in stool.  Can eat ok. No nausea or vomiting.  She is using yogurt which helps.  CORONARY ARTERY DISEASE  Patient presents in follow up of CAD. Patient was diagnosed in 2008. The patient has no associated CHF. The patient is currently taking a beta blocker, statin, and aspirin. CAD was diagnosed 13 years ago.  Patient is having no angina. Patient has used no NTG.  Patient is followed by cardiology.  Patient had no stents . Last angiography was unknown, last echocardiogram unknown.  She has a pacemaker and atrial fibrillation..  Patient had stroke 2008 no residual effects.  She has osteoporosis but on no medicines   Past Medical History:  Diagnosis Date  . Acute gangrenous appendicitis with perforation and peritonitis 11/22/2011  . Afib ([place]) 2008  . CVA (cerebral infarction) 2008   no residual deficits  . Osteoporosis     Past Surgical History:  Procedure Laterality Date  . INSERT / REPLACE / REMOVE PACEMAKER    . LAPAROSCOPIC APPENDECTOMY  11/22/2011   Procedure: APPENDECTOMY LAPAROSCOPIC;  Surgeon: Stark Klein, MD;  Location: [facility];  Service: General;  Laterality: N/A;  . PACEMAKER INSERTION    . PPM GENERATOR CHANGEOUT N/A 08/16/2017   Procedure: PPM GENERATOR CHANGEOUT;  Surgeon: Evans Lance, MD;  Location: [facility];  Service: Cardiovascular;  Laterality: N/A;  . RIGHT/LEFT HEART CATH AND CORONARY ANGIOGRAPHY N/A 02/01/2017   Procedure: Right/Left Heart Cath and Coronary Angiography;   Surgeon: Troy Sine, MD;  Location: [facility];  Service: Cardiovascular;  Laterality: N/A;  . TONSILLECTOMY AND ADENOIDECTOMY  1935    Family History  Problem Relation Age of Onset  . Coronary artery disease Mother   . Heart attack Mother   . Atrial fibrillation Father   . Lung cancer Father   . Alcohol abuse Brother   . Failure to thrive Brother     Social History   Socioeconomic History  . Marital status: Widowed    Spouse name: Not on file  . Number of children: 3  . Years of education: Not on file  . Highest education level: Not on file  Occupational History  . Occupation: Retired  Tobacco Use  . Smoking status: Former Smoker    Types: Cigarettes    Quit date: 1975    Years since quitting: 46.7  . Smokeless tobacco: Never Used  Vaping Use  . Vaping Use: Never used  Substance and Sexual Activity  . Alcohol use: No  . Drug use: No  . Sexual activity: Not Currently  Other Topics Concern  . Not on file  Social History Narrative   Lives with dtr, moved to [place] from [facility] in 2014, widowed early 2013 (33 years married)   Social Determinants of Health   Financial Resource Strain:   . Difficulty of Paying Living Expenses: Not on file  Food Insecurity:   . Worried About  Running Out of Food in the Last Year: Not on file  . Ran Out of Food in the Last Year: Not on file  Transportation Needs:   . Lack of Transportation (Medical): Not on file  . Lack of Transportation (Non-Medical): Not on file  Physical Activity:   . Days of Exercise per Week: Not on file  . Minutes of Exercise per Session: Not on file  Stress:   . Feeling of Stress : Not on file  Social Connections:   . Frequency of Communication with Friends and Family: Not on file  . Frequency of Social Gatherings with Friends and Family: Not on file  . Attends Religious Services: Not on file  . Active Member of Clubs or Organizations: Not on file  . Attends Archivist Meetings:  Not on file  . Marital Status: Not on file  Intimate Partner Violence:   . Fear of Current or Ex-Partner: Not on file  . Emotionally Abused: Not on file  . Physically Abused: Not on file  . Sexually Abused: Not on file    ROS Review of Systems  Constitutional: Negative.   HENT: Negative.   Eyes: Negative.   Respiratory: Negative for cough and shortness of breath.   Cardiovascular: Negative for chest pain, palpitations and leg swelling.  Gastrointestinal: Negative.   Genitourinary: Negative.   Musculoskeletal: Negative.   Neurological: Negative.   Psychiatric/Behavioral: Negative.     Objective:   Today's Vitals: BP 130/60   Pulse 76   Temp (!) 97.1 F (36.2 C)   Resp 16   Ht 5\' 1"  (1.549 m)   Wt 120 lb (54.4 kg)   BMI 22.67 kg/m   Physical Exam Vitals reviewed.  Constitutional:      Appearance: She is well-developed.  HENT:     Head: Normocephalic and atraumatic.     Right Ear: Tympanic membrane, ear canal and external ear normal.     Left Ear: Tympanic membrane, ear canal and external ear normal.     Mouth/Throat:     Mouth: Mucous membranes are moist.     Pharynx: Oropharynx is clear.  Eyes:     Extraocular Movements: Extraocular movements intact.     Conjunctiva/sclera: Conjunctivae normal.     Pupils: Pupils are equal, round, and reactive to light.  Cardiovascular:     Rate and Rhythm: Normal rate and regular rhythm.     Pulses: Normal pulses.     Heart sounds: Normal heart sounds.  Pulmonary:     Effort: Pulmonary effort is normal.     Breath sounds: Normal breath sounds.  Abdominal:     General: Abdomen is flat. Bowel sounds are normal.     Palpations: Abdomen is soft.  Musculoskeletal:        General: Normal range of motion.     Cervical back: Normal range of motion and neck supple.  Skin:    General: Skin is warm and dry.     Capillary Refill: Capillary refill takes less than 2 seconds.  Neurological:     General: No focal deficit present.      Mental Status: She is alert and oriented to person, place, and time. Mental status is at baseline.  Psychiatric:        Mood and Affect: Mood normal.        Behavior: Behavior normal.        Thought Content: Thought content normal.        Judgment: Judgment normal.  Assessment & Plan:   Problem List Items Addressed This Visit      Cardiovascular and Mediastinum   Chronic atrial fibrillation (HCC) - Primary (Chronic)  AN INDIVIDUAL CARE PLAN for atrial fibrillation was established and reinforced today.  The patient's status was assessed using clinical findings on exam, labs, and other diagnostic testing. Patient's success at meeting treatment goals based on disease specific evidence-bassed guidelines and found to be in good control. RECOMMENDATIONS include maintaining present medicines and treatment.    Chronic systolic CHF (congestive heart failure) (HCC) An individualized care plan was established and reinforced.  The patient's disease status was assessed using clinical finding son exam today, labs, and/or other diagnostic testing such as x-rays, to determine the patient's success in meeting treatmentgoalsbased on disease-based guidelines and found to beimproving. But not at goal yet. Medications prescriptions no changes Laboratory tests ordered to be performed today include cmp. RECOMMENDATIONS: given include see cardiology.  Call physician is patient gains 3 lbs in one day or 5 lbs for one week.  Call for progressive PND, orthopnea or increased pedal edema.     Musculoskeletal and Integument   Age-related osteoporosis without current pathological fracture Patient has history of osteoporosis, need old records to determine treatment     Genitourinary    Stage 4 renal failure AN INDIVIDUAL CARE PLAN for renal disease was established and reinforced today.  The patient's status was assessed using clinical findings on exam, labs, and other diagnostic testing. Patient's success at  meeting treatment goals based on disease specific evidence-bassed guidelines and found to be in fair control. RECOMMENDATIONS include maintaining present medicines and treatment.     Other   Current use of long term anticoagulation (Chronic) Patient is on chronic xarelto for atrial fibrillation, no bleeding diathesis.      Outpatient Encounter Medications as of 03/27/2020  Medication Sig  . amiodarone (PACERONE) 200 MG tablet TAKE 1 TABLET ON MONDAY, TUESDAY, WEDNESDAY, THURSDAY, FRIDAY, AND SATURDAY. DO NOT TAKE ANY ON Sunday.  . digoxin (LANOXIN) 0.125 MG tablet Take 0.5 tablets (62.5 mcg total) by mouth daily.  . metoprolol tartrate (LOPRESSOR) 100 MG tablet Take 1 tablet (100 mg total) by mouth 2 (two) times daily. Please make yearly appt with Dr. Lovena Le for April for future refills. 1st attempt  . XARELTO 15 MG TABS tablet TAKE 1 TABLET EVERY DAY WITH SUPPER   No facility-administered encounter medications on file as of 03/27/2020.    Follow-up: Return in about 3 months (around 06/26/2020), or if symptoms worsen or fail to improve.   Reinaldo Meeker, MD

## 2020-03-28 ENCOUNTER — Other Ambulatory Visit: Payer: Self-pay

## 2020-03-28 DIAGNOSIS — E875 Hyperkalemia: Secondary | ICD-10-CM

## 2020-03-28 LAB — CBC WITH DIFFERENTIAL/PLATELET
Basophils Absolute: 0.1 10*3/uL (ref 0.0–0.2)
Basos: 1 %
EOS (ABSOLUTE): 0 10*3/uL (ref 0.0–0.4)
Eos: 0 %
Hematocrit: 43.2 % (ref 34.0–46.6)
Hemoglobin: 13.8 g/dL (ref 11.1–15.9)
Immature Grans (Abs): 0.1 10*3/uL (ref 0.0–0.1)
Immature Granulocytes: 1 %
Lymphocytes Absolute: 2.6 10*3/uL (ref 0.7–3.1)
Lymphs: 25 %
MCH: 28.8 pg (ref 26.6–33.0)
MCHC: 31.9 g/dL (ref 31.5–35.7)
MCV: 90 fL (ref 79–97)
Monocytes Absolute: 0.8 10*3/uL (ref 0.1–0.9)
Monocytes: 7 %
Neutrophils Absolute: 6.8 10*3/uL (ref 1.4–7.0)
Neutrophils: 66 %
Platelets: 273 10*3/uL (ref 150–450)
RBC: 4.8 x10E6/uL (ref 3.77–5.28)
RDW: 12.9 % (ref 11.7–15.4)
WBC: 10.3 10*3/uL (ref 3.4–10.8)

## 2020-03-28 LAB — COMPREHENSIVE METABOLIC PANEL
ALT: 18 IU/L (ref 0–32)
AST: 23 IU/L (ref 0–40)
Albumin/Globulin Ratio: 1.3 (ref 1.2–2.2)
Albumin: 4.4 g/dL (ref 3.6–4.6)
Alkaline Phosphatase: 61 IU/L (ref 44–121)
BUN/Creatinine Ratio: 18 (ref 12–28)
BUN: 31 mg/dL — ABNORMAL HIGH (ref 8–27)
Bilirubin Total: 0.5 mg/dL (ref 0.0–1.2)
CO2: 26 mmol/L (ref 20–29)
Calcium: 10.5 mg/dL — ABNORMAL HIGH (ref 8.7–10.3)
Chloride: 103 mmol/L (ref 96–106)
Creatinine, Ser: 1.75 mg/dL — ABNORMAL HIGH (ref 0.57–1.00)
GFR calc Af Amer: 30 mL/min/{1.73_m2} — ABNORMAL LOW (ref >59)
GFR calc non Af Amer: 26 mL/min/{1.73_m2} — ABNORMAL LOW (ref >59)
Globulin, Total: 3.3 g/dL (ref 1.5–4.5)
Glucose: 98 mg/dL (ref 65–99)
Potassium: 5.7 mmol/L — ABNORMAL HIGH (ref 3.5–5.2)
Sodium: 143 mmol/L (ref 134–144)
Total Protein: 7.7 g/dL (ref 6.0–8.5)

## 2020-03-28 LAB — LIPID PANEL
Chol/HDL Ratio: 3.6 ratio (ref 0.0–4.4)
Cholesterol, Total: 186 mg/dL (ref 100–199)
HDL: 51 mg/dL (ref >39)
LDL Chol Calc (NIH): 109 mg/dL — ABNORMAL HIGH (ref 0–99)
Triglycerides: 145 mg/dL (ref 0–149)
VLDL Cholesterol Cal: 26 mg/dL (ref 5–40)

## 2020-03-28 LAB — CARDIOVASCULAR RISK ASSESSMENT

## 2020-03-28 LAB — DIGOXIN LEVEL: Digoxin, Serum: 1 ng/mL — ABNORMAL HIGH (ref 0.5–0.9)

## 2020-03-28 NOTE — Progress Notes (Signed)
CBC normal, kidney tests stage 3b, potassium high at 5.7, stop any potassium supplement, recheck in one week, calcium high also, add PTH level, liver tests normal, LDL cholesterol 109, Digoxin level 1.0 stay on same dose lp

## 2020-04-04 ENCOUNTER — Other Ambulatory Visit: Payer: Self-pay

## 2020-04-04 ENCOUNTER — Other Ambulatory Visit: Payer: Medicare PPO

## 2020-04-04 DIAGNOSIS — E875 Hyperkalemia: Secondary | ICD-10-CM

## 2020-04-05 ENCOUNTER — Other Ambulatory Visit: Payer: Medicare PPO

## 2020-04-05 LAB — COMPREHENSIVE METABOLIC PANEL
ALT: 17 IU/L (ref 0–32)
AST: 20 IU/L (ref 0–40)
Albumin/Globulin Ratio: 1.4 (ref 1.2–2.2)
Albumin: 4.4 g/dL (ref 3.6–4.6)
Alkaline Phosphatase: 63 IU/L (ref 44–121)
BUN/Creatinine Ratio: 14 (ref 12–28)
BUN: 26 mg/dL (ref 8–27)
Bilirubin Total: 0.5 mg/dL (ref 0.0–1.2)
CO2: 24 mmol/L (ref 20–29)
Calcium: 10.1 mg/dL (ref 8.7–10.3)
Chloride: 104 mmol/L (ref 96–106)
Creatinine, Ser: 1.83 mg/dL — ABNORMAL HIGH (ref 0.57–1.00)
GFR calc Af Amer: 28 mL/min/{1.73_m2} — ABNORMAL LOW (ref >59)
GFR calc non Af Amer: 24 mL/min/{1.73_m2} — ABNORMAL LOW (ref >59)
Globulin, Total: 3.2 g/dL (ref 1.5–4.5)
Glucose: 94 mg/dL (ref 65–99)
Potassium: 5.7 mmol/L — ABNORMAL HIGH (ref 3.5–5.2)
Sodium: 142 mmol/L (ref 134–144)
Total Protein: 7.6 g/dL (ref 6.0–8.5)

## 2020-04-06 ENCOUNTER — Other Ambulatory Visit: Payer: Self-pay | Admitting: Legal Medicine

## 2020-04-06 NOTE — Progress Notes (Signed)
Potassium remains 5.7, stop all potassium including light salt.  See nephrology for kidney disease lp

## 2020-04-10 ENCOUNTER — Ambulatory Visit (INDEPENDENT_AMBULATORY_CARE_PROVIDER_SITE_OTHER): Payer: Medicare PPO | Admitting: Legal Medicine

## 2020-04-10 ENCOUNTER — Encounter: Payer: Self-pay | Admitting: Legal Medicine

## 2020-04-10 ENCOUNTER — Other Ambulatory Visit: Payer: Self-pay

## 2020-04-10 VITALS — BP 118/70 | HR 70 | Temp 96.4°F | Resp 16 | Ht 61.0 in | Wt 122.0 lb

## 2020-04-10 DIAGNOSIS — N184 Chronic kidney disease, stage 4 (severe): Secondary | ICD-10-CM

## 2020-04-10 NOTE — Progress Notes (Signed)
Subjective:  Patient ID: Tracy Vasquez, female    DOB: 1929-10-27  Age: 86 y.o. MRN: [id number]  Chief Complaint  Patient presents with  . Follow-up    Patient is here to discuss the results of bloodwork.    HPI: follow up for renal failure.  Her eGfr was 32 this years and slowly declining with [place] 5.7.  I discussed nephrology consult.  She agrees.  We spoke at length about the needs to maintain present kidney function.   Current Outpatient Medications on File Prior to Visit  Medication Sig Dispense Refill  . amiodarone (PACERONE) 200 MG tablet TAKE 1 TABLET ON MONDAY, TUESDAY, WEDNESDAY, THURSDAY, FRIDAY, AND SATURDAY. DO NOT TAKE ANY ON Sunday. 90 tablet 2  . digoxin (LANOXIN) 0.125 MG tablet Take 0.5 tablets (62.5 mcg total) by mouth daily. 45 tablet 3  . metoprolol tartrate (LOPRESSOR) 100 MG tablet Take 1 tablet (100 mg total) by mouth 2 (two) times daily. Please make yearly appt with Dr. Lovena Le for April for future refills. 1st attempt 180 tablet 3  . XARELTO 15 MG TABS tablet TAKE 1 TABLET EVERY DAY WITH SUPPER 90 tablet 1   No current facility-administered medications on file prior to visit.   Past Medical History:  Diagnosis Date  . Acute gangrenous appendicitis with perforation and peritonitis 11/22/2011  . Afib ([facility]) 2008  . CVA (cerebral infarction) 2008   no residual deficits  . Osteoporosis    Past Surgical History:  Procedure Laterality Date  . INSERT / REPLACE / REMOVE PACEMAKER    . LAPAROSCOPIC APPENDECTOMY  11/22/2011   Procedure: APPENDECTOMY LAPAROSCOPIC;  Surgeon: Stark Klein, MD;  Location: [facility];  Service: General;  Laterality: N/A;  . PACEMAKER INSERTION    . PPM GENERATOR CHANGEOUT N/A 08/16/2017   Procedure: PPM GENERATOR CHANGEOUT;  Surgeon: Evans Lance, MD;  Location: [facility];  Service: Cardiovascular;  Laterality: N/A;  . RIGHT/LEFT HEART CATH AND CORONARY ANGIOGRAPHY N/A 02/01/2017   Procedure: Right/Left Heart Cath and Coronary  Angiography;  Surgeon: Troy Sine, MD;  Location: [facility];  Service: Cardiovascular;  Laterality: N/A;  . TONSILLECTOMY AND ADENOIDECTOMY  1935    Family History  Problem Relation Age of Onset  . Coronary artery disease Mother   . Heart attack Mother   . Atrial fibrillation Father   . Lung cancer Father   . Alcohol abuse Brother   . Failure to thrive Brother    Social History   Socioeconomic History  . Marital status: Widowed    Spouse name: Not on file  . Number of children: 3  . Years of education: Not on file  . Highest education level: Not on file  Occupational History  . Occupation: Retired  Tobacco Use  . Smoking status: Former Smoker    Types: Cigarettes    Quit date: 1975    Years since quitting: 46.7  . Smokeless tobacco: Never Used  Vaping Use  . Vaping Use: Never used  Substance and Sexual Activity  . Alcohol use: No  . Drug use: No  . Sexual activity: Not Currently  Other Topics Concern  . Not on file  Social History Narrative   Lives with dtr, moved to [place] from [facility] in 2014, widowed early 2013 (42 years married)   Social Determinants of Health   Financial Resource Strain:   . Difficulty of Paying Living Expenses: Not on file  Food Insecurity:   . Worried About Running  Out of Food in the Last Year: Not on file  . Ran Out of Food in the Last Year: Not on file  Transportation Needs:   . Lack of Transportation (Medical): Not on file  . Lack of Transportation (Non-Medical): Not on file  Physical Activity:   . Days of Exercise per Week: Not on file  . Minutes of Exercise per Session: Not on file  Stress:   . Feeling of Stress : Not on file  Social Connections:   . Frequency of Communication with Friends and Family: Not on file  . Frequency of Social Gatherings with Friends and Family: Not on file  . Attends Religious Services: Not on file  . Active Member of Clubs or Organizations: Not on file  . Attends Archivist  Meetings: Not on file  . Marital Status: Not on file    Review of Systems  Constitutional: Negative.   HENT: Negative.   Eyes: Positive for discharge.  Respiratory: Negative.   Cardiovascular: Negative.   Gastrointestinal: Negative.   Genitourinary: Negative.   Neurological: Negative.      Objective:  BP 118/70   Pulse 70   Temp (!) 96.4 F (35.8 C)   Resp 16   Ht $R'5\' 1"'Ei$  (1.549 m)   Wt 122 lb (55.3 kg)   BMI 23.05 kg/m   BP/Weight 04/10/2020 03/27/2020 7/41/6384  Systolic BP 536 468 032  Diastolic BP 70 60 80  Wt. (Lbs) 122 120 130.8  BMI 23.05 22.67 23.54    Physical Exam Cardiovascular:     Rate and Rhythm: Normal rate and regular rhythm.     Pulses: Normal pulses.     Heart sounds: Normal heart sounds.  Pulmonary:     Effort: Pulmonary effort is normal.     Breath sounds: Normal breath sounds.       Lab Results  Component Value Date   WBC 10.3 03/27/2020   HGB 13.8 03/27/2020   HCT 43.2 03/27/2020   PLT 273 03/27/2020   GLUCOSE 94 04/04/2020   CHOL 186 03/27/2020   TRIG 145 03/27/2020   HDL 51 03/27/2020   LDLCALC 109 (H) 03/27/2020   ALT 17 04/04/2020   AST 20 04/04/2020   NA 142 04/04/2020   K 5.7 (H) 04/04/2020   CL 104 04/04/2020   CREATININE 1.83 (H) 04/04/2020   BUN 26 04/04/2020   CO2 24 04/04/2020   TSH 2.190 03/05/2017   INR 1.15 01/31/2017      Assessment & Plan:   1. Chronic kidney disease, stage 4 (severe) (HCC) We discussed her renal failure and will get renal consult        Follow-up: No follow-ups on file.  An After Visit Summary was printed and given to the patient.  [place] [phone]

## 2020-04-17 ENCOUNTER — Other Ambulatory Visit: Payer: Self-pay | Admitting: Legal Medicine

## 2020-04-17 ENCOUNTER — Telehealth: Payer: Self-pay

## 2020-04-17 DIAGNOSIS — N184 Chronic kidney disease, stage 4 (severe): Secondary | ICD-10-CM

## 2020-04-17 NOTE — Telephone Encounter (Signed)
Pt called and sts that she was suppose to be referral to a kidney doctor, there is no order for this. Can you please put the order in  for the referral?

## 2020-04-17 NOTE — Telephone Encounter (Signed)
I resent request for nephrology consult lp

## 2020-05-23 ENCOUNTER — Ambulatory Visit (INDEPENDENT_AMBULATORY_CARE_PROVIDER_SITE_OTHER): Payer: Medicare PPO

## 2020-05-23 DIAGNOSIS — I495 Sick sinus syndrome: Secondary | ICD-10-CM

## 2020-05-24 LAB — CUP PACEART REMOTE DEVICE CHECK
Date Time Interrogation Session: 20211118061316
Implantable Lead Implant Date: 20080827
Implantable Lead Implant Date: 20080827
Implantable Lead Location: 753859
Implantable Lead Location: 753860
Implantable Lead Model: 4135
Implantable Lead Model: 4136
Implantable Lead Serial Number: 28356762
Implantable Lead Serial Number: 28365470
Implantable Pulse Generator Implant Date: 20190211
Pulse Gen Serial Number: 396317

## 2020-05-27 NOTE — Progress Notes (Signed)
Remote pacemaker transmission.   

## 2020-07-03 ENCOUNTER — Ambulatory Visit: Payer: Medicare PPO | Admitting: Legal Medicine

## 2020-07-09 ENCOUNTER — Encounter: Payer: Self-pay | Admitting: Legal Medicine

## 2020-07-09 ENCOUNTER — Ambulatory Visit (INDEPENDENT_AMBULATORY_CARE_PROVIDER_SITE_OTHER): Payer: Medicare PPO | Admitting: Legal Medicine

## 2020-07-09 ENCOUNTER — Other Ambulatory Visit: Payer: Self-pay

## 2020-07-09 VITALS — BP 110/58 | HR 64 | Temp 96.6°F | Resp 16 | Ht 61.0 in | Wt 121.0 lb

## 2020-07-09 DIAGNOSIS — I482 Chronic atrial fibrillation, unspecified: Secondary | ICD-10-CM | POA: Diagnosis not present

## 2020-07-09 DIAGNOSIS — I5022 Chronic systolic (congestive) heart failure: Secondary | ICD-10-CM

## 2020-07-09 DIAGNOSIS — E782 Mixed hyperlipidemia: Secondary | ICD-10-CM

## 2020-07-09 DIAGNOSIS — M810 Age-related osteoporosis without current pathological fracture: Secondary | ICD-10-CM

## 2020-07-09 DIAGNOSIS — Z7901 Long term (current) use of anticoagulants: Secondary | ICD-10-CM

## 2020-07-09 DIAGNOSIS — N184 Chronic kidney disease, stage 4 (severe): Secondary | ICD-10-CM

## 2020-07-09 NOTE — Progress Notes (Signed)
Subjective:  Patient ID: Tracy Vasquez, female    DOB: 1929-08-13  Age: 87 y.o. MRN: [id number]  Chief Complaint  Patient presents with  . Atrial Fibrillation  . Hyperlipidemia    HPI: Chronic visit Patient has a diagnosis of permanent atrial fibrillation.   Patient is on xarelta and has controlled ventricular response.  Patient is CV stable.   Patient presents with hyperlipidemia.  Compliance with treatment has been good; patient takes medicines as directed, maintains low cholesterol diet, follows up as directed, and maintains exercise regimen.  Patient is using none without problems.  She has stage 4 renal failure and seeing nephrology, no dialysis. Current Outpatient Medications on File Prior to Visit  Medication Sig Dispense Refill  . amiodarone (PACERONE) 200 MG tablet TAKE 1 TABLET ON MONDAY, TUESDAY, WEDNESDAY, THURSDAY, FRIDAY, AND SATURDAY. DO NOT TAKE ANY ON Sunday. 90 tablet 2  . digoxin (LANOXIN) 0.125 MG tablet Take 0.5 tablets (62.5 mcg total) by mouth daily. 45 tablet 3  . metoprolol tartrate (LOPRESSOR) 100 MG tablet Take 1 tablet (100 mg total) by mouth 2 (two) times daily. Please make yearly appt with Dr. Lovena Le for April for future refills. 1st attempt 180 tablet 3  . XARELTO 15 MG TABS tablet TAKE 1 TABLET EVERY DAY WITH SUPPER 90 tablet 1   No current facility-administered medications on file prior to visit.   Past Medical History:  Diagnosis Date  . Acute gangrenous appendicitis with perforation and peritonitis 11/22/2011  . Afib ([place]) 2008  . CVA (cerebral infarction) 2008   no residual deficits  . Osteoporosis    Past Surgical History:  Procedure Laterality Date  . INSERT / REPLACE / REMOVE PACEMAKER    . LAPAROSCOPIC APPENDECTOMY  11/22/2011   Procedure: APPENDECTOMY LAPAROSCOPIC;  Surgeon: Stark Klein, MD;  Location: [place];  Service: General;  Laterality: N/A;  . PACEMAKER INSERTION    . PPM GENERATOR CHANGEOUT N/A 08/16/2017   Procedure: PPM GENERATOR  CHANGEOUT;  Surgeon: Evans Lance, MD;  Location: [facility];  Service: Cardiovascular;  Laterality: N/A;  . RIGHT/LEFT HEART CATH AND CORONARY ANGIOGRAPHY N/A 02/01/2017   Procedure: Right/Left Heart Cath and Coronary Angiography;  Surgeon: Troy Sine, MD;  Location: [facility];  Service: Cardiovascular;  Laterality: N/A;  . TONSILLECTOMY AND ADENOIDECTOMY  1935    Family History  Problem Relation Age of Onset  . Coronary artery disease Mother   . Heart attack Mother   . Atrial fibrillation Father   . Lung cancer Father   . Alcohol abuse Brother   . Failure to thrive Brother    Social History   Socioeconomic History  . Marital status: Widowed    Spouse name: Not on file  . Number of children: 3  . Years of education: Not on file  . Highest education level: Not on file  Occupational History  . Occupation: Retired  Tobacco Use  . Smoking status: Former Smoker    Types: Cigarettes    Quit date: 1975    Years since quitting: 47.0  . Smokeless tobacco: Never Used  Vaping Use  . Vaping Use: Never used  Substance and Sexual Activity  . Alcohol use: No  . Drug use: No  . Sexual activity: Not Currently  Other Topics Concern  . Not on file  Social History Narrative   Lives with dtr, moved to [place] from [facility] in 2014, widowed early 2013 (60 years married)   Social Determinants of  Health   Financial Resource Strain: Not on file  Food Insecurity: Not on file  Transportation Needs: Not on file  Physical Activity: Not on file  Stress: Not on file  Social Connections: Not on file    Review of Systems  Constitutional: Negative for activity change, appetite change and fatigue.  HENT: Negative for congestion, hearing loss and sinus pain.   Eyes: Negative for visual disturbance.  Respiratory: Negative for cough, choking and chest tightness.   Cardiovascular: Negative for chest pain, palpitations and leg swelling.  Gastrointestinal: Negative for  abdominal distention and abdominal pain.  Endocrine: Negative for polyuria.  Genitourinary: Negative for difficulty urinating, dyspareunia and urgency.  Musculoskeletal: Negative for arthralgias and back pain.  Skin: Negative.   Neurological: Negative for dizziness, syncope and headaches.  Psychiatric/Behavioral: Negative.      Objective:  BP (!) 110/58   Pulse 64   Temp (!) 96.6 F (35.9 C)   Resp 16   Ht 5\' 1"  (1.549 m)   Wt 121 lb (54.9 kg)   BMI 22.86 kg/m   BP/Weight 07/09/2020 04/10/2020 1/61/0960  Systolic BP 454 098 119  Diastolic BP 58 70 60  Wt. (Lbs) 121 122 120  BMI 22.86 23.05 22.67    Physical Exam Vitals reviewed.  Constitutional:      Appearance: Normal appearance.  HENT:     Right Ear: Tympanic membrane, ear canal and external ear normal.     Left Ear: Tympanic membrane, ear canal and external ear normal.     Mouth/Throat:     Mouth: Mucous membranes are moist.     Pharynx: Oropharynx is clear.  Eyes:     Extraocular Movements: Extraocular movements intact.     Conjunctiva/sclera: Conjunctivae normal.     Pupils: Pupils are equal, round, and reactive to light.  Cardiovascular:     Rate and Rhythm: Normal rate. Rhythm irregular.     Pulses: Normal pulses.     Heart sounds: Normal heart sounds. No murmur heard. No gallop.   Pulmonary:     Effort: Pulmonary effort is normal.     Breath sounds: Normal breath sounds.  Abdominal:     General: Abdomen is flat. Bowel sounds are normal. There is no distension.     Palpations: Abdomen is soft.     Tenderness: There is no abdominal tenderness.  Musculoskeletal:        General: No tenderness. Normal range of motion.     Cervical back: Normal range of motion and neck supple.  Skin:    General: Skin is warm.     Capillary Refill: Capillary refill takes less than 2 seconds.  Neurological:     General: No focal deficit present.     Mental Status: She is alert and oriented to person, place, and time. Mental  status is at baseline.  Psychiatric:        Mood and Affect: Mood normal.        Thought Content: Thought content normal.        Judgment: Judgment normal.       Lab Results  Component Value Date   WBC 10.3 03/27/2020   HGB 13.8 03/27/2020   HCT 43.2 03/27/2020   PLT 273 03/27/2020   GLUCOSE 94 04/04/2020   CHOL 186 03/27/2020   TRIG 145 03/27/2020   HDL 51 03/27/2020   LDLCALC 109 (H) 03/27/2020   ALT 17 04/04/2020   AST 20 04/04/2020   NA 142 04/04/2020   K 5.7 (  H) 04/04/2020   CL 104 04/04/2020   CREATININE 1.83 (H) 04/04/2020   BUN 26 04/04/2020   CO2 24 04/04/2020   TSH 2.190 03/05/2017   INR 1.15 01/31/2017      Assessment & Plan:   1. Chronic atrial fibrillation (HCC) - CBC with Differential/Platelet - Comprehensive metabolic panel - Digoxin level Patient has a diagnosis of permanent atrial fibrillation.   Patient is on xarelto and has controlled ventricular response.  Patient is CV stable.  2. Chronic systolic CHF (congestive heart failure) (HCC) - CBC with Differential/Platelet - Comprehensive metabolic panel An individualized care plan was established and reinforced.  The patient's disease status was assessed using clinical finding son exam today, labs, and/or other diagnostic testing such as x-rays, to determine the patient's success in meeting treatmentgoalsbased on disease-based guidelines and found to beimproving. But not at goal yet. Medications prescriptions no changes Laboratory tests ordered to be performed today include routine. RECOMMENDATIONS: given include see cardiology.  Call physician is patient gains 3 lbs in one day or 5 lbs for one week.  Call for progressive PND, orthopnea or increased pedal edema.  3. Age-related osteoporosis without current pathological fracture Patient has osteoporosis and on no medicines  4. Mixed hyperlipidemia - CBC with Differential/Platelet - Lipid panel AN INDIVIDUAL CARE PLAN for hyperlipidemia/  cholesterol was established and reinforced today.  The patient's status was assessed using clinical findings on exam, lab and other diagnostic tests. The patient's disease status was assessed based on evidence-based guidelines and found to be well controlled. MEDICATIONS were reviewed. SELF MANAGEMENT GOALS have been discussed and patient's success at attaining the goal of low cholesterol was assessed. RECOMMENDATION given include regular exercise 3 days a week and low cholesterol/low fat diet. CLINICAL SUMMARY including written plan to identify barriers unique to the patient due to social or economic  reasons was discussed.  5. Current use of long term anticoagulation Patient is on long term anticoagulation and no bleeding  6. Chronic kidney disease, stage 4 (severe) (HCC) AN INDIVIDUAL CARE PLAN chronic renal disease was established and reinforced today.  The patient's status was assessed using clinical findings on exam, labs, and other diagnostic testing. Patient's success at meeting treatment goals based on disease specific evidence-bassed guidelines and found to be in fair control. RECOMMENDATIONS include maintaining present medicines and treatment. She is not on dialysis      Orders Placed This Encounter  Procedures  . CBC with Differential/Platelet  . Comprehensive metabolic panel  . Lipid panel  . Digoxin level      I spent 20  minutes dedicated to the care of this patient on the date of this encounter to include face-to-face time with the patient, as well as:   Follow-up: Return in about 6 months (around 01/06/2021), or fasting.  An After Visit Summary was printed and given to the patient.  Reinaldo Meeker, MD [facility] [phone]

## 2020-07-10 LAB — LIPID PANEL
Chol/HDL Ratio: 3.2 ratio (ref 0.0–4.4)
Cholesterol, Total: 176 mg/dL (ref 100–199)
HDL: 55 mg/dL (ref >39)
LDL Chol Calc (NIH): 97 mg/dL (ref 0–99)
Triglycerides: 136 mg/dL (ref 0–149)
VLDL Cholesterol Cal: 24 mg/dL (ref 5–40)

## 2020-07-10 LAB — COMPREHENSIVE METABOLIC PANEL
ALT: 14 IU/L (ref 0–32)
AST: 19 IU/L (ref 0–40)
Albumin/Globulin Ratio: 1.2 (ref 1.2–2.2)
Albumin: 4.2 g/dL (ref 3.6–4.6)
Alkaline Phosphatase: 93 IU/L (ref 44–121)
BUN/Creatinine Ratio: 15 (ref 12–28)
BUN: 25 mg/dL (ref 8–27)
Bilirubin Total: 0.5 mg/dL (ref 0.0–1.2)
CO2: 26 mmol/L (ref 20–29)
Calcium: 10 mg/dL (ref 8.7–10.3)
Chloride: 101 mmol/L (ref 96–106)
Creatinine, Ser: 1.71 mg/dL — ABNORMAL HIGH (ref 0.57–1.00)
GFR calc Af Amer: 30 mL/min/{1.73_m2} — ABNORMAL LOW (ref >59)
GFR calc non Af Amer: 26 mL/min/{1.73_m2} — ABNORMAL LOW (ref >59)
Globulin, Total: 3.4 g/dL (ref 1.5–4.5)
Glucose: 90 mg/dL (ref 65–99)
Potassium: 5.6 mmol/L — ABNORMAL HIGH (ref 3.5–5.2)
Sodium: 139 mmol/L (ref 134–144)
Total Protein: 7.6 g/dL (ref 6.0–8.5)

## 2020-07-10 LAB — CBC WITH DIFFERENTIAL/PLATELET
Basophils Absolute: 0.1 10*3/uL (ref 0.0–0.2)
Basos: 1 %
EOS (ABSOLUTE): 0.1 10*3/uL (ref 0.0–0.4)
Eos: 1 %
Hematocrit: 43 % (ref 34.0–46.6)
Hemoglobin: 13.8 g/dL (ref 11.1–15.9)
Immature Grans (Abs): 0 10*3/uL (ref 0.0–0.1)
Immature Granulocytes: 1 %
Lymphocytes Absolute: 2.1 10*3/uL (ref 0.7–3.1)
Lymphs: 23 %
MCH: 28.9 pg (ref 26.6–33.0)
MCHC: 32.1 g/dL (ref 31.5–35.7)
MCV: 90 fL (ref 79–97)
Monocytes Absolute: 0.7 10*3/uL (ref 0.1–0.9)
Monocytes: 8 %
Neutrophils Absolute: 5.9 10*3/uL (ref 1.4–7.0)
Neutrophils: 66 %
Platelets: 282 10*3/uL (ref 150–450)
RBC: 4.78 x10E6/uL (ref 3.77–5.28)
RDW: 13.2 % (ref 11.7–15.4)
WBC: 8.9 10*3/uL (ref 3.4–10.8)

## 2020-07-10 LAB — CARDIOVASCULAR RISK ASSESSMENT

## 2020-07-10 LAB — DIGOXIN LEVEL: Digoxin, Serum: 1 ng/mL — ABNORMAL HIGH (ref 0.5–0.9)

## 2020-07-10 NOTE — Progress Notes (Signed)
Digoxin level 1.o good, CBC normal, kidney tests stable, potassium remains 5.6, Ldl cholesterol 109,  lp

## 2020-07-10 NOTE — Progress Notes (Signed)
It has remained high and she is watching with nephrology lp

## 2020-08-22 ENCOUNTER — Ambulatory Visit (INDEPENDENT_AMBULATORY_CARE_PROVIDER_SITE_OTHER): Payer: Medicare PPO

## 2020-08-22 DIAGNOSIS — I495 Sick sinus syndrome: Secondary | ICD-10-CM | POA: Diagnosis not present

## 2020-08-23 LAB — CUP PACEART REMOTE DEVICE CHECK
Battery Remaining Longevity: 54 mo
Battery Remaining Percentage: 82 %
Brady Statistic RA Percent Paced: 0 %
Brady Statistic RV Percent Paced: 90 %
Date Time Interrogation Session: 20220217030100
Implantable Lead Implant Date: 20080827
Implantable Lead Implant Date: 20080827
Implantable Lead Location: 753859
Implantable Lead Location: 753860
Implantable Lead Model: 4135
Implantable Lead Model: 4136
Implantable Lead Serial Number: 28356762
Implantable Lead Serial Number: 28365470
Implantable Pulse Generator Implant Date: 20190211
Lead Channel Impedance Value: 491 Ohm
Lead Channel Impedance Value: 573 Ohm
Lead Channel Pacing Threshold Amplitude: 0.7 V
Lead Channel Pacing Threshold Pulse Width: 0.4 ms
Lead Channel Setting Pacing Amplitude: 2 V
Lead Channel Setting Pacing Amplitude: 2.4 V
Lead Channel Setting Pacing Pulse Width: 0.4 ms
Lead Channel Setting Sensing Sensitivity: 2.5 mV
Pulse Gen Serial Number: 396317

## 2020-08-28 ENCOUNTER — Other Ambulatory Visit: Payer: Self-pay

## 2020-08-28 ENCOUNTER — Encounter: Payer: Self-pay | Admitting: Legal Medicine

## 2020-08-28 ENCOUNTER — Ambulatory Visit (INDEPENDENT_AMBULATORY_CARE_PROVIDER_SITE_OTHER): Payer: Medicare PPO | Admitting: Legal Medicine

## 2020-08-28 VITALS — BP 122/60 | HR 70 | Temp 97.5°F | Resp 16 | Ht 61.0 in | Wt 123.2 lb

## 2020-08-28 DIAGNOSIS — R300 Dysuria: Secondary | ICD-10-CM

## 2020-08-28 DIAGNOSIS — I5022 Chronic systolic (congestive) heart failure: Secondary | ICD-10-CM | POA: Diagnosis not present

## 2020-08-28 DIAGNOSIS — I700 Atherosclerosis of aorta: Secondary | ICD-10-CM | POA: Diagnosis not present

## 2020-08-28 LAB — POCT URINALYSIS DIP (CLINITEK)
Bilirubin, UA: NEGATIVE
Glucose, UA: NEGATIVE mg/dL
Ketones, POC UA: NEGATIVE mg/dL
Nitrite, UA: POSITIVE — AB
POC PROTEIN,UA: 30 — AB
Spec Grav, UA: 1.015 (ref 1.010–1.025)
Urobilinogen, UA: 0.2 E.U./dL
pH, UA: 5.5 (ref 5.0–8.0)

## 2020-08-28 MED ORDER — CEPHALEXIN 500 MG PO CAPS
500.0000 mg | ORAL_CAPSULE | Freq: Two times a day (BID) | ORAL | 0 refills | Status: DC
Start: 2020-08-28 — End: 2020-09-16

## 2020-08-28 NOTE — Progress Notes (Signed)
Subjective:  Patient ID: Tracy Vasquez, female    DOB: 07/15/1929  Age: 87 y.o. MRN: [id number]  Chief Complaint  Patient presents with  . Pain    Patients states she is having frequent pains in her lower abdomin and discomfort when she urinates.     HPI: she is having pain in bladder area for 3 days.  Positive dysuria and spasms.  No fever or chills. She has been drinking and lot of water   Current Outpatient Medications on File Prior to Visit  Medication Sig Dispense Refill  . amiodarone (PACERONE) 200 MG tablet TAKE 1 TABLET ON MONDAY, TUESDAY, WEDNESDAY, THURSDAY, FRIDAY, AND SATURDAY. DO NOT TAKE ANY ON Sunday. 90 tablet 2  . digoxin (LANOXIN) 0.125 MG tablet Take 0.5 tablets (62.5 mcg total) by mouth daily. 45 tablet 3  . metoprolol tartrate (LOPRESSOR) 100 MG tablet Take 1 tablet (100 mg total) by mouth 2 (two) times daily. Please make yearly appt with Dr. Lovena Le for April for future refills. 1st attempt 180 tablet 3  . XARELTO 15 MG TABS tablet TAKE 1 TABLET EVERY DAY WITH SUPPER 90 tablet 1   No current facility-administered medications on file prior to visit.   Past Medical History:  Diagnosis Date  . Acute gangrenous appendicitis with perforation and peritonitis 11/22/2011  . Afib ([place]) 2008  . CVA (cerebral infarction) 2008   no residual deficits  . Osteoporosis    Past Surgical History:  Procedure Laterality Date  . INSERT / REPLACE / REMOVE PACEMAKER    . LAPAROSCOPIC APPENDECTOMY  11/22/2011   Procedure: APPENDECTOMY LAPAROSCOPIC;  Surgeon: Stark Klein, MD;  Location: [facility];  Service: General;  Laterality: N/A;  . PACEMAKER INSERTION    . PPM GENERATOR CHANGEOUT N/A 08/16/2017   Procedure: PPM GENERATOR CHANGEOUT;  Surgeon: Evans Lance, MD;  Location: [facility];  Service: Cardiovascular;  Laterality: N/A;  . RIGHT/LEFT HEART CATH AND CORONARY ANGIOGRAPHY N/A 02/01/2017   Procedure: Right/Left Heart Cath and Coronary Angiography;  Surgeon: Troy Sine, MD;  Location: [facility];  Service: Cardiovascular;  Laterality: N/A;  . TONSILLECTOMY AND ADENOIDECTOMY  1935    Family History  Problem Relation Age of Onset  . Coronary artery disease Mother   . Heart attack Mother   . Atrial fibrillation Father   . Lung cancer Father   . Alcohol abuse Brother   . Failure to thrive Brother    Social History   Socioeconomic History  . Marital status: Widowed    Spouse name: Not on file  . Number of children: 3  . Years of education: Not on file  . Highest education level: Not on file  Occupational History  . Occupation: Retired  Tobacco Use  . Smoking status: Former Smoker    Types: Cigarettes    Quit date: 1975    Years since quitting: 47.1  . Smokeless tobacco: Never Used  Vaping Use  . Vaping Use: Never used  Substance and Sexual Activity  . Alcohol use: No  . Drug use: No  . Sexual activity: Not Currently  Other Topics Concern  . Not on file  Social History Narrative   Lives with dtr, moved to [place] from [facility] in 2014, widowed early 2013 (70 years married)   Social Determinants of Health   Financial Resource Strain: Not on file  Food Insecurity: Not on file  Transportation Needs: Not on file  Physical Activity: Not on file  Stress:  Not on file  Social Connections: Not on file    Review of Systems  Constitutional: Negative for activity change and diaphoresis.  HENT: Negative for congestion and sinus pain.   Eyes: Negative for visual disturbance.  Respiratory: Negative for chest tightness and shortness of breath.   Cardiovascular: Negative for chest pain, palpitations and leg swelling.  Gastrointestinal: Negative for abdominal distention and abdominal pain.  Endocrine: Negative for polyuria.  Genitourinary: Positive for dysuria. Negative for difficulty urinating and urgency.  Musculoskeletal: Negative for arthralgias and back pain.  Skin: Negative.   Neurological: Negative.   Psychiatric/Behavioral:  Negative.      Objective:  BP 122/60 (BP Location: Right Arm, Patient Position: Sitting, Cuff Size: Normal)   Pulse 70   Temp (!) 97.5 F (36.4 C) (Temporal)   Resp 16   Ht '5\' 1"'$  (1.549 m)   Wt 123 lb 3.2 oz (55.9 kg)   SpO2 92%   BMI 23.28 kg/m   BP/Weight 08/28/2020 07/09/2020 0000000  Systolic BP 123XX123 [id number]  Diastolic BP 60 58 70  Wt. (Lbs) 123.2 121 122  BMI 23.28 22.86 23.05    Physical Exam Vitals reviewed.  Constitutional:      Appearance: Normal appearance.  HENT:     Head: Normocephalic and atraumatic.     Right Ear: Tympanic membrane normal.     Left Ear: Tympanic membrane normal.     Nose: Nose normal.     Mouth/Throat:     Mouth: Mucous membranes are moist.     Pharynx: Oropharynx is clear.  Eyes:     Extraocular Movements: Extraocular movements intact.     Conjunctiva/sclera: Conjunctivae normal.     Pupils: Pupils are equal, round, and reactive to light.  Cardiovascular:     Rate and Rhythm: Normal rate and regular rhythm.     Pulses: Normal pulses.     Heart sounds: Normal heart sounds. No murmur heard. No gallop.   Pulmonary:     Effort: Pulmonary effort is normal. No respiratory distress.     Breath sounds: No rales.  Abdominal:     General: Abdomen is flat. Bowel sounds are normal. There is no distension.     Palpations: Abdomen is soft.     Tenderness: There is no abdominal tenderness.  Musculoskeletal:        General: Normal range of motion.     Cervical back: Normal range of motion and neck supple.  Skin:    General: Skin is warm.     Capillary Refill: Capillary refill takes less than 2 seconds.  Neurological:     General: No focal deficit present.     Mental Status: She is alert and oriented to person, place, and time.       Lab Results  Component Value Date   WBC 8.9 07/09/2020   HGB 13.8 07/09/2020   HCT 43.0 07/09/2020   PLT 282 07/09/2020   GLUCOSE 90 07/09/2020   CHOL 176 07/09/2020   TRIG 136 07/09/2020   HDL 55  07/09/2020   LDLCALC 97 07/09/2020   ALT 14 07/09/2020   AST 19 07/09/2020   NA 139 07/09/2020   K 5.6 (H) 07/09/2020   CL 101 07/09/2020   CREATININE 1.71 (H) 07/09/2020   BUN 25 07/09/2020   CO2 26 07/09/2020   TSH 2.190 03/05/2017   INR 1.15 01/31/2017      Assessment & Plan:   Diagnoses and all orders for this visit: Dysuria -  POCT URINALYSIS DIP (CLINITEK) -     Urine Culture -     cephALEXin (KEFLEX) 500 MG capsule; Take 1 capsule (500 mg total) by mouth 2 (two) times daily. Patient has UTI and will be treated with keflex  Aortic atherosclerosis ([place]) Atherosclerosis fount on abdominal ct in past   Orders Placed This Encounter  Procedures  . Urine Culture  . AMB Referral to [facility]  . POCT URINALYSIS DIP (CLINITEK)      I spent 20 minutes dedicated to the care of this patient on the date of this encounter to include face-to-face time with the patient, as well as:   Follow-up: Return if symptoms worsen or fail to improve.  An After Visit Summary was printed and given to the patient.  Reinaldo Meeker, MD [facility] [phone]

## 2020-08-28 NOTE — Progress Notes (Signed)
Remote pacemaker transmission.   

## 2020-08-30 LAB — URINE CULTURE

## 2020-09-01 ENCOUNTER — Other Ambulatory Visit: Payer: Self-pay | Admitting: Legal Medicine

## 2020-09-01 NOTE — Progress Notes (Signed)
Urine culture E. Coli sensitive to keflex lp

## 2020-09-12 DIAGNOSIS — I129 Hypertensive chronic kidney disease with stage 1 through stage 4 chronic kidney disease, or unspecified chronic kidney disease: Secondary | ICD-10-CM | POA: Diagnosis not present

## 2020-09-12 DIAGNOSIS — E875 Hyperkalemia: Secondary | ICD-10-CM | POA: Diagnosis not present

## 2020-09-12 DIAGNOSIS — I480 Paroxysmal atrial fibrillation: Secondary | ICD-10-CM | POA: Diagnosis not present

## 2020-09-12 DIAGNOSIS — I5022 Chronic systolic (congestive) heart failure: Secondary | ICD-10-CM | POA: Diagnosis not present

## 2020-09-12 DIAGNOSIS — N184 Chronic kidney disease, stage 4 (severe): Secondary | ICD-10-CM | POA: Diagnosis not present

## 2020-09-16 ENCOUNTER — Ambulatory Visit (INDEPENDENT_AMBULATORY_CARE_PROVIDER_SITE_OTHER): Payer: Medicare PPO | Admitting: Legal Medicine

## 2020-09-16 ENCOUNTER — Other Ambulatory Visit: Payer: Self-pay

## 2020-09-16 ENCOUNTER — Encounter: Payer: Self-pay | Admitting: Legal Medicine

## 2020-09-16 ENCOUNTER — Telehealth: Payer: Self-pay

## 2020-09-16 ENCOUNTER — Other Ambulatory Visit: Payer: Self-pay | Admitting: Internal Medicine

## 2020-09-16 VITALS — BP 120/60 | HR 76 | Temp 97.8°F | Resp 16 | Ht 62.0 in | Wt 124.0 lb

## 2020-09-16 DIAGNOSIS — N390 Urinary tract infection, site not specified: Secondary | ICD-10-CM | POA: Insufficient documentation

## 2020-09-16 DIAGNOSIS — N184 Chronic kidney disease, stage 4 (severe): Secondary | ICD-10-CM

## 2020-09-16 DIAGNOSIS — N3001 Acute cystitis with hematuria: Secondary | ICD-10-CM

## 2020-09-16 LAB — POCT URINALYSIS DIP (CLINITEK)
Bilirubin, UA: NEGATIVE
Glucose, UA: NEGATIVE mg/dL
Ketones, POC UA: NEGATIVE mg/dL
Nitrite, UA: NEGATIVE
POC PROTEIN,UA: 100 — AB
Spec Grav, UA: 1.02 (ref 1.010–1.025)
Urobilinogen, UA: 0.2 E.U./dL
pH, UA: 6 (ref 5.0–8.0)

## 2020-09-16 MED ORDER — CEPHALEXIN 500 MG PO CAPS: 500.0000 mg | ORAL_CAPSULE | Freq: Two times a day (BID) | ORAL | 0 refills | Status: DC

## 2020-09-16 NOTE — Telephone Encounter (Signed)
Pt states she is having reoccurring symptoms of bladder infection. Last UA showed E. Coli.   Made appointment for today.   Royce Macadamia, [place] 09/16/20 9:49 AM

## 2020-09-16 NOTE — Progress Notes (Signed)
Subjective:  Patient ID: Tracy Vasquez, female    DOB: 11/10/1929  Age: 87 y.o. MRN: [id number]  Chief Complaint  Patient presents with  . Urinary Tract Infection    HPI: patient is having a recurrent UTI. I reviewed nephrology notes. She was last treated with keflex.   Current Outpatient Medications on File Prior to Visit  Medication Sig Dispense Refill  . amiodarone (PACERONE) 200 MG tablet TAKE 1 TABLET ON MONDAY, TUESDAY, WEDNESDAY, THURSDAY, FRIDAY, AND SATURDAY. DO NOT TAKE ANY ON Sunday. 90 tablet 2  . digoxin (LANOXIN) 0.125 MG tablet Take 0.5 tablets (62.5 mcg total) by mouth daily. 45 tablet 3  . metoprolol tartrate (LOPRESSOR) 100 MG tablet Take 1 tablet (100 mg total) by mouth 2 (two) times daily. Please make yearly appt with Dr. Lovena Le for April for future refills. 1st attempt 180 tablet 3  . XARELTO 15 MG TABS tablet TAKE 1 TABLET EVERY DAY WITH SUPPER 90 tablet 1   No current facility-administered medications on file prior to visit.   Past Medical History:  Diagnosis Date  . Acute gangrenous appendicitis with perforation and peritonitis 11/22/2011  . Afib ([place]) 2008  . CVA (cerebral infarction) 2008   no residual deficits  . Osteoporosis    Past Surgical History:  Procedure Laterality Date  . INSERT / REPLACE / REMOVE PACEMAKER    . LAPAROSCOPIC APPENDECTOMY  11/22/2011   Procedure: APPENDECTOMY LAPAROSCOPIC;  Surgeon: Stark Klein, MD;  Location: [place];  Service: General;  Laterality: N/A;  . PACEMAKER INSERTION    . PPM GENERATOR CHANGEOUT N/A 08/16/2017   Procedure: PPM GENERATOR CHANGEOUT;  Surgeon: Evans Lance, MD;  Location: [place] [facility];  Service: Cardiovascular;  Laterality: N/A;  . RIGHT/LEFT HEART CATH AND CORONARY ANGIOGRAPHY N/A 02/01/2017   Procedure: Right/Left Heart Cath and Coronary Angiography;  Surgeon: Troy Sine, MD;  Location: [place] [facility];  Service: Cardiovascular;  Laterality: N/A;  . TONSILLECTOMY AND ADENOIDECTOMY  1935     Family History  Problem Relation Age of Onset  . Coronary artery disease Mother   . Heart attack Mother   . Atrial fibrillation Father   . Lung cancer Father   . Alcohol abuse Brother   . Failure to thrive Brother    Social History   Socioeconomic History  . Marital status: Widowed    Spouse name: Not on file  . Number of children: 3  . Years of education: Not on file  . Highest education level: Not on file  Occupational History  . Occupation: Retired  Tobacco Use  . Smoking status: Former Smoker    Types: Cigarettes    Quit date: 1975    Years since quitting: 47.2  . Smokeless tobacco: Never Used  Vaping Use  . Vaping Use: Never used  Substance and Sexual Activity  . Alcohol use: No  . Drug use: No  . Sexual activity: Not Currently  Other Topics Concern  . Not on file  Social History Narrative   Lives with dtr, moved to [place] from [facility] in 2014, widowed early 2013 (24 years married)   Social Determinants of Health   Financial Resource Strain: Not on file  Food Insecurity: Not on file  Transportation Needs: Not on file  Physical Activity: Not on file  Stress: Not on file  Social Connections: Not on file    Review of Systems  Constitutional: Negative for activity change and diaphoresis.  HENT: Negative.   Respiratory: Negative  for chest tightness and shortness of breath.   Cardiovascular: Negative for chest pain, palpitations and leg swelling.  Gastrointestinal: Negative for abdominal distention and abdominal pain.  Endocrine: Negative for polyuria.  Genitourinary: Negative for dysuria.  Musculoskeletal: Negative for arthralgias and back pain.  Skin: Negative.   Neurological: Negative.   Psychiatric/Behavioral: Negative.      Objective:  BP 120/60   Pulse 76   Temp 97.8 F (36.6 C)   Resp 16   Ht '5\' 2"'$  (1.575 m)   Wt 124 lb (56.2 kg)   SpO2 96%   BMI 22.68 kg/m   BP/Weight 09/16/2020 0000000 [id number]  Systolic BP 123456 123XX123 [id number]   Diastolic BP 60 60 58  Wt. (Lbs) 124 123.2 121  BMI 22.68 23.28 22.86    Physical Exam Vitals reviewed.  Constitutional:      Appearance: Normal appearance.  Cardiovascular:     Rate and Rhythm: Normal rate.     Pulses: Normal pulses.     Heart sounds: No murmur heard. No gallop.   Pulmonary:     Effort: Pulmonary effort is normal.     Breath sounds: Normal breath sounds.  Abdominal:     General: Abdomen is flat. Bowel sounds are normal. There is no distension.     Palpations: Abdomen is soft.     Tenderness: There is no abdominal tenderness.  Musculoskeletal:        General: Normal range of motion.     Cervical back: Normal range of motion and neck supple.  Skin:    General: Skin is warm.     Capillary Refill: Capillary refill takes less than 2 seconds.  Neurological:     Mental Status: She is alert.       Lab Results  Component Value Date   WBC 8.9 07/09/2020   HGB 13.8 07/09/2020   HCT 43.0 07/09/2020   PLT 282 07/09/2020   GLUCOSE 90 07/09/2020   CHOL 176 07/09/2020   TRIG 136 07/09/2020   HDL 55 07/09/2020   LDLCALC 97 07/09/2020   ALT 14 07/09/2020   AST 19 07/09/2020   NA 139 07/09/2020   K 5.6 (H) 07/09/2020   CL 101 07/09/2020   CREATININE 1.71 (H) 07/09/2020   BUN 25 07/09/2020   CO2 26 07/09/2020   TSH 2.190 03/05/2017   INR 1.15 01/31/2017      Assessment & Plan:   Diagnoses and all orders for this visit: Chronic kidney disease, stage 4 (severe) ([place]) Patient sees nephrology for this.  Acute cystitis with hematuria -     cephALEXin (KEFLEX) 500 MG capsule; Take 1 capsule (500 mg total) by mouth 2 (two) times daily. -     Urine Culture -     POCT URINALYSIS DIP (CLINITEK) Recurrent UTI with blood.  Treat for 14 days and recheck for clearing.  We cultured urine.  She might need urology for blood in urine but she is on eliquis also.         I spent 20 minutes dedicated to the care of this patient on the date of this encounter to  include face-to-face time with the patient, as well as:   Follow-up: Return in about 2 weeks (around 09/30/2020) for recheck urine.  An After Visit Summary was printed and given to the patient.  Reinaldo Meeker, MD [facility] [phone]

## 2020-09-19 ENCOUNTER — Other Ambulatory Visit: Payer: Self-pay | Admitting: Internal Medicine

## 2020-09-19 NOTE — Telephone Encounter (Signed)
Xarelto '15mg'$  refill request received. Pt is 87 years old, weight-56.2kg, Crea-1.71 on 07/09/20, last seen by Dr. Lovena Le on 10/20/2019, Diagnosis-Afib, CrCl-19.68m/min; Dose is appropriate based on dosing criteria. Will send in refill to requested pharmacy.

## 2020-09-22 ENCOUNTER — Other Ambulatory Visit: Payer: Self-pay | Admitting: Legal Medicine

## 2020-09-22 DIAGNOSIS — N3001 Acute cystitis with hematuria: Secondary | ICD-10-CM

## 2020-09-22 LAB — URINE CULTURE

## 2020-09-22 MED ORDER — SULFAMETHOXAZOLE-TRIMETHOPRIM 800-160 MG PO TABS: 1.0000 | ORAL_TABLET | Freq: Two times a day (BID) | ORAL | 0 refills | Status: DC

## 2020-09-22 NOTE — Progress Notes (Signed)
Cystitis  salmonella- [facility] state lab, also E. Coli,recommend change to sulfa drugs lp

## 2020-09-24 DIAGNOSIS — L820 Inflamed seborrheic keratosis: Secondary | ICD-10-CM | POA: Diagnosis not present

## 2020-09-24 DIAGNOSIS — C44329 Squamous cell carcinoma of skin of other parts of face: Secondary | ICD-10-CM | POA: Diagnosis not present

## 2020-10-15 ENCOUNTER — Encounter: Payer: Self-pay | Admitting: Legal Medicine

## 2020-10-15 ENCOUNTER — Ambulatory Visit: Payer: Medicare PPO | Admitting: Legal Medicine

## 2020-10-15 ENCOUNTER — Other Ambulatory Visit: Payer: Self-pay

## 2020-10-15 VITALS — BP 124/70 | HR 74 | Temp 97.1°F | Ht 63.0 in | Wt 128.0 lb

## 2020-10-15 DIAGNOSIS — N3001 Acute cystitis with hematuria: Secondary | ICD-10-CM | POA: Diagnosis not present

## 2020-10-15 LAB — POCT URINALYSIS DIPSTICK
Bilirubin, UA: NEGATIVE
Blood, UA: POSITIVE
Glucose, UA: NEGATIVE
Ketones, UA: NEGATIVE
Nitrite, UA: NEGATIVE
Protein, UA: POSITIVE — AB
Spec Grav, UA: 1.02 (ref 1.010–1.025)
Urobilinogen, UA: 0.2 E.U./dL
pH, UA: 5.5 (ref 5.0–8.0)

## 2020-10-15 MED ORDER — CEFTRIAXONE SODIUM 1 G IJ SOLR
1.0000 g | Freq: Once | INTRAMUSCULAR | Status: AC
  Administered 2020-10-15: 1 g via INTRAMUSCULAR

## 2020-10-15 NOTE — Progress Notes (Signed)
Acute Office Visit  Subjective:    Patient ID: Tracy Vasquez, female    DOB: 1929/08/29, 87 y.o.   MRN: [id number]  Chief Complaint  Patient presents with  . Urinary Tract Infection    HPI Patient is in today for UTI symptoms states she recently was treated for a UTi and had relief for approx 1 week after completing antibiotics however yesterday started having symptoms again.last culture e.coli and salmonella.  Past Medical History:  Diagnosis Date  . Acute gangrenous appendicitis with perforation and peritonitis 11/22/2011  . Afib ([place]) 2008  . CVA (cerebral infarction) 2008   no residual deficits  . Osteoporosis     Past Surgical History:  Procedure Laterality Date  . INSERT / REPLACE / REMOVE PACEMAKER    . LAPAROSCOPIC APPENDECTOMY  11/22/2011   Procedure: APPENDECTOMY LAPAROSCOPIC;  Surgeon: Stark Klein, MD;  Location: [place];  Service: General;  Laterality: N/A;  . PACEMAKER INSERTION    . PPM GENERATOR CHANGEOUT N/A 08/16/2017   Procedure: PPM GENERATOR CHANGEOUT;  Surgeon: Evans Lance, MD;  Location: [facility];  Service: Cardiovascular;  Laterality: N/A;  . RIGHT/LEFT HEART CATH AND CORONARY ANGIOGRAPHY N/A 02/01/2017   Procedure: Right/Left Heart Cath and Coronary Angiography;  Surgeon: Troy Sine, MD;  Location: [facility];  Service: Cardiovascular;  Laterality: N/A;  . TONSILLECTOMY AND ADENOIDECTOMY  1935    Family History  Problem Relation Age of Onset  . Coronary artery disease Mother   . Heart attack Mother   . Atrial fibrillation Father   . Lung cancer Father   . Alcohol abuse Brother   . Failure to thrive Brother     Social History   Socioeconomic History  . Marital status: Widowed    Spouse name: Not on file  . Number of children: 3  . Years of education: Not on file  . Highest education level: Not on file  Occupational History  . Occupation: Retired  Tobacco Use  . Smoking status: Former Smoker    Types: Cigarettes     Quit date: 1975    Years since quitting: 47.3  . Smokeless tobacco: Never Used  Vaping Use  . Vaping Use: Never used  Substance and Sexual Activity  . Alcohol use: No  . Drug use: No  . Sexual activity: Not Currently  Other Topics Concern  . Not on file  Social History Narrative   Lives with dtr, moved to [place] from [facility] in 2014, widowed early 2013 (81 years married)   Social Determinants of Health   Financial Resource Strain: Not on file  Food Insecurity: Not on file  Transportation Needs: Not on file  Physical Activity: Not on file  Stress: Not on file  Social Connections: Not on file  Intimate Partner Violence: Not on file    Outpatient Medications Prior to Visit  Medication Sig Dispense Refill  . amiodarone (PACERONE) 200 MG tablet TAKE 1 TABLET ON MONDAY, TUESDAY, WEDNESDAY, THURSDAY, FRIDAY, AND SATURDAY. DO NOT TAKE ANY ON Sunday. 90 tablet 2  . digoxin (LANOXIN) 0.125 MG tablet TAKE 1/2 TABLET EVERY DAY (NEED TO SCHEDULE YEARLY APPOINTMENT FOR REFILLS) 45 tablet 0  . metoprolol tartrate (LOPRESSOR) 100 MG tablet TAKE 1 TABLET TWICE DAILY (NEED TO SCHEDULE YEARLY APPOINTMENT FOR REFILLS) 60 tablet 0  . XARELTO 15 MG TABS tablet TAKE 1 TABLET EVERY DAY WITH SUPPER 90 tablet 0  . sulfamethoxazole-trimethoprim (BACTRIM DS) 800-160 MG tablet Take 1 tablet by  mouth 2 (two) times daily. 20 tablet 0   No facility-administered medications prior to visit.    No Known Allergies  Review of Systems  Constitutional: Negative for chills, fatigue and fever.  HENT: Negative for congestion, ear pain, postnasal drip, rhinorrhea, sinus pressure, sinus pain and sore throat.   Eyes: Negative for visual disturbance.  Respiratory: Negative for cough and shortness of breath.   Cardiovascular: Negative for chest pain.  Gastrointestinal: Negative for abdominal pain, diarrhea and nausea.  Genitourinary: Positive for dysuria, frequency and urgency.  Musculoskeletal: Negative for  back pain.  Skin: Negative.   Neurological: Negative.        Objective:    Physical Exam Vitals reviewed.  Constitutional:      Appearance: Normal appearance.  HENT:     Right Ear: Tympanic membrane normal.     Left Ear: Tympanic membrane normal.  Eyes:     Extraocular Movements: Extraocular movements intact.     Conjunctiva/sclera: Conjunctivae normal.     Pupils: Pupils are equal, round, and reactive to light.  Cardiovascular:     Rate and Rhythm: Normal rate and regular rhythm.     Pulses: Normal pulses.     Heart sounds: Normal heart sounds. No murmur heard. No gallop.   Pulmonary:     Effort: Pulmonary effort is normal. No respiratory distress.     Breath sounds: Normal breath sounds. No rales.  Abdominal:     General: There is no distension.     Tenderness: There is no abdominal tenderness.  Musculoskeletal:        General: Normal range of motion.     Cervical back: Normal range of motion and neck supple.  Skin:    General: Skin is warm.  Neurological:     General: No focal deficit present.     Mental Status: She is alert.     BP 124/70   Pulse 74   Temp (!) 97.1 F (36.2 C)   Ht '5\' 3"'$  (1.6 m)   Wt 128 lb (58.1 kg)   SpO2 97%   BMI 22.67 kg/m  Wt Readings from Last 3 Encounters:  10/15/20 128 lb (58.1 kg)  09/16/20 124 lb (56.2 kg)  08/28/20 123 lb 3.2 oz (55.9 kg)    Health Maintenance Due  Topic Date Due  . PNA vac Low Risk Adult (2 of 2 - PCV13) 07/07/2007  . COVID-19 Vaccine (3 - Booster for Moderna series) 09/13/2020    There are no preventive care reminders to display for this patient.   Lab Results  Component Value Date   TSH 2.190 03/05/2017   Lab Results  Component Value Date   WBC 8.9 07/09/2020   HGB 13.8 07/09/2020   HCT 43.0 07/09/2020   MCV 90 07/09/2020   PLT 282 07/09/2020   Lab Results  Component Value Date   NA 139 07/09/2020   K 5.6 (H) 07/09/2020   CO2 26 07/09/2020   GLUCOSE 90 07/09/2020   BUN 25 07/09/2020    CREATININE 1.71 (H) 07/09/2020   BILITOT 0.5 07/09/2020   ALKPHOS 93 07/09/2020   AST 19 07/09/2020   ALT 14 07/09/2020   PROT 7.6 07/09/2020   ALBUMIN 4.2 07/09/2020   CALCIUM 10.0 07/09/2020   ANIONGAP 6 02/03/2017   GFR 35.27 (L) 04/25/2014   Lab Results  Component Value Date   CHOL 176 07/09/2020   Lab Results  Component Value Date   HDL 55 07/09/2020   Lab Results  Component  Value Date   LDLCALC 97 07/09/2020   Lab Results  Component Value Date   TRIG 136 07/09/2020   Lab Results  Component Value Date   CHOLHDL 3.2 07/09/2020   No results found for: HGBA1C     Assessment & Plan:  Diagnoses and all orders for this visit: Acute cystitis with hematuria -     Urine Culture -     POCT urinalysis dipstick -     cefTRIAXone (ROCEPHIN) injection 1 g Recurrent UTIs on oral medicines, use rocephin 1gm IM x 3 days       I spent 20 minutes dedicated to the care of this patient on the date of this encounter to include face-to-face time with the patient, as well as:   Follow-up: Return in about 1 day (around 10/16/2020) for injection.  An After Visit Summary was printed and given to the patient.  Reinaldo Meeker, MD [facility] [phone]

## 2020-10-16 ENCOUNTER — Ambulatory Visit (INDEPENDENT_AMBULATORY_CARE_PROVIDER_SITE_OTHER): Payer: Medicare PPO

## 2020-10-16 DIAGNOSIS — N3001 Acute cystitis with hematuria: Secondary | ICD-10-CM

## 2020-10-16 MED ORDER — CEFTRIAXONE SODIUM 1 G IJ SOLR
1.0000 g | Freq: Once | INTRAMUSCULAR | Status: AC
  Administered 2020-10-16: 1 g via INTRAMUSCULAR

## 2020-10-17 ENCOUNTER — Ambulatory Visit (INDEPENDENT_AMBULATORY_CARE_PROVIDER_SITE_OTHER): Payer: Medicare PPO

## 2020-10-17 ENCOUNTER — Other Ambulatory Visit: Payer: Self-pay | Admitting: Internal Medicine

## 2020-10-17 ENCOUNTER — Other Ambulatory Visit: Payer: Self-pay

## 2020-10-17 DIAGNOSIS — N3001 Acute cystitis with hematuria: Secondary | ICD-10-CM

## 2020-10-17 MED ORDER — CEFTRIAXONE SODIUM 1 G IJ SOLR
1.0000 g | Freq: Once | INTRAMUSCULAR | Status: AC
  Administered 2020-10-17: 1 g via INTRAMUSCULAR

## 2020-10-21 ENCOUNTER — Telehealth: Payer: Self-pay

## 2020-10-21 LAB — URINE CULTURE

## 2020-10-21 NOTE — Progress Notes (Signed)
Still has salmonella species , sent to health department sensitive to rocephin lp

## 2020-10-21 NOTE — Telephone Encounter (Signed)
Salmonella should have been treated by rocephin, you get it from contaminated food, eggs, chicken.  , we can recheck in one month lp

## 2020-10-21 NOTE — Telephone Encounter (Signed)
Pt called and urine culture results given. Pt questioning where the salmonella in urine could have came from. Also questioning what she needs to do to get rid of it.   Harrell Lark 10/21/20 3:34 PM

## 2020-10-22 NOTE — Telephone Encounter (Signed)
I called patient and I explained her. She understood.

## 2020-10-30 LAB — STATE LABORATORY REPORT

## 2020-11-06 ENCOUNTER — Ambulatory Visit (INDEPENDENT_AMBULATORY_CARE_PROVIDER_SITE_OTHER): Payer: Medicare PPO | Admitting: Legal Medicine

## 2020-11-06 ENCOUNTER — Other Ambulatory Visit: Payer: Self-pay

## 2020-11-06 ENCOUNTER — Encounter: Payer: Self-pay | Admitting: Legal Medicine

## 2020-11-06 VITALS — BP 110/60 | HR 71 | Temp 97.6°F | Resp 15 | Ht 63.0 in | Wt 123.0 lb

## 2020-11-06 DIAGNOSIS — N3090 Cystitis, unspecified without hematuria: Secondary | ICD-10-CM

## 2020-11-06 LAB — POCT URINALYSIS DIP (CLINITEK)
Bilirubin, UA: NEGATIVE
Glucose, UA: NEGATIVE mg/dL
Ketones, POC UA: NEGATIVE mg/dL
Nitrite, UA: POSITIVE — AB
POC PROTEIN,UA: 100 — AB
Spec Grav, UA: 1.015 (ref 1.010–1.025)
Urobilinogen, UA: 0.2 E.U./dL
pH, UA: 6 (ref 5.0–8.0)

## 2020-11-06 LAB — COMPREHENSIVE METABOLIC PANEL
ALT: 9 IU/L (ref 0–32)
AST: 15 IU/L (ref 0–40)
Albumin/Globulin Ratio: 1.3 (ref 1.2–2.2)
Albumin: 4.3 g/dL (ref 3.6–4.6)
Alkaline Phosphatase: 76 IU/L (ref 44–121)
BUN/Creatinine Ratio: 14 (ref 12–28)
BUN: 29 mg/dL — ABNORMAL HIGH (ref 8–27)
Bilirubin Total: 0.4 mg/dL (ref 0.0–1.2)
CO2: 22 mmol/L (ref 20–29)
Calcium: 9.6 mg/dL (ref 8.7–10.3)
Chloride: 100 mmol/L (ref 96–106)
Creatinine, Ser: 2.05 mg/dL — ABNORMAL HIGH (ref 0.57–1.00)
Globulin, Total: 3.3 g/dL (ref 1.5–4.5)
Glucose: 82 mg/dL (ref 65–99)
Potassium: 5.9 mmol/L — ABNORMAL HIGH (ref 3.5–5.2)
Sodium: 136 mmol/L (ref 134–144)
Total Protein: 7.6 g/dL (ref 6.0–8.5)
eGFR: 23 mL/min/{1.73_m2} — ABNORMAL LOW (ref >59)

## 2020-11-06 MED ORDER — CEFTRIAXONE SODIUM 1 G IJ SOLR
1.0000 g | Freq: Once | INTRAMUSCULAR | Status: AC
  Administered 2020-11-06: 1 g via INTRAMUSCULAR

## 2020-11-06 NOTE — Progress Notes (Signed)
Subjective:  Patient ID: Tracy Vasquez, female    DOB: 1930/04/04  Age: 87 y.o. MRN: [id number]  Chief Complaint  Patient presents with  . Urinary Tract Infection    Patient has frequency urination. She took cranberry juice.    HPI: patient is having cloudy urine.  She grew our salmonella [place] in past and was treated with IM rocephin. She sees [facility] for stage 4 renal disease.  She is getting recurrent UTIs growing salmonella.  [facility] lives.[facility]. 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 No GI symptoms.   Current Outpatient Medications on File Prior to Visit  Medication Sig Dispense Refill  . amiodarone (PACERONE) 200 MG tablet TAKE 1 TABLET ON MONDAY, TUESDAY, WEDNESDAY, THURSDAY, FRIDAY, AND SATURDAY. DO NOT TAKE ANY ON Sunday. 90 tablet 2  . digoxin (LANOXIN) 0.125 MG tablet TAKE 1/2 TABLET EVERY DAY (NEED TO SCHEDULE YEARLY APPOINTMENT FOR REFILLS) 45 tablet 0  . metoprolol tartrate (LOPRESSOR) 100 MG tablet Take 1 tablet (100 mg total) by mouth 2 (two) times daily. Please make overdue appt with Dr. Lovena Le before anymore refills. Thank you 2nd attempt 30 tablet 0  . XARELTO 15 MG TABS tablet TAKE 1 TABLET EVERY DAY WITH SUPPER 90 tablet 0   No current facility-administered medications on file prior to visit.   Past Medical History:  Diagnosis Date  . Acute gangrenous appendicitis with perforation and peritonitis 11/22/2011  . Afib ([place]) 2008  . CVA (cerebral infarction) 2008   no residual deficits  . Osteoporosis    Past Surgical History:  Procedure Laterality Date  . INSERT / REPLACE / REMOVE PACEMAKER    . LAPAROSCOPIC APPENDECTOMY  11/22/2011   Procedure: APPENDECTOMY LAPAROSCOPIC;  Surgeon: Stark Klein, MD;  Location: [facility];  Service: General;  Laterality: N/A;  . PACEMAKER INSERTION    . PPM GENERATOR CHANGEOUT N/A 08/16/2017   Procedure: PPM GENERATOR CHANGEOUT;  Surgeon: Evans Lance, MD;  Location: [facility];  Service: Cardiovascular;  Laterality:  N/A;  . RIGHT/LEFT HEART CATH AND CORONARY ANGIOGRAPHY N/A 02/01/2017   Procedure: Right/Left Heart Cath and Coronary Angiography;  Surgeon: Troy Sine, MD;  Location: [facility];  Service: Cardiovascular;  Laterality: N/A;  . TONSILLECTOMY AND ADENOIDECTOMY  1935    Family History  Problem Relation Age of Onset  . Coronary artery disease Mother   . Heart attack Mother   . Atrial fibrillation Father   . Lung cancer Father   . Alcohol abuse Brother   . Failure to thrive Brother    Social History   Socioeconomic History  . Marital status: Widowed    Spouse name: Not on file  . Number of children: 3  . Years of education: Not on file  . Highest education level: Not on file  Occupational History  . Occupation: Retired  Tobacco Use  . Smoking status: Former Smoker    Types: Cigarettes    Quit date: 1975    Years since quitting: 47.3  . Smokeless tobacco: Never Used  Vaping Use  . Vaping Use: Never used  Substance and Sexual Activity  . Alcohol use: No  . Drug use: No  . Sexual activity: Not Currently  Other Topics Concern  . Not on file  Social History Narrative   Lives with dtr, moved to [place] from [facility] in 2014, widowed early 2013 (54 years married)   Social Determinants of Health   Financial Resource Strain: Not on file  Food Insecurity: Not  on file  Transportation Needs: Not on file  Physical Activity: Not on file  Stress: Not on file  Social Connections: Not on file    Review of Systems  Constitutional: Negative for activity change and appetite change.  HENT: Negative for congestion and sinus pain.   Eyes: Negative for visual disturbance.  Respiratory: Negative for chest tightness and shortness of breath.   Cardiovascular: Negative for chest pain, palpitations and leg swelling.  Gastrointestinal: Negative for abdominal distention and abdominal pain.  Genitourinary: Negative for difficulty urinating and dysuria.  Musculoskeletal: Negative  for arthralgias and back pain.  Skin: Negative.   Neurological: Negative.   Psychiatric/Behavioral: Negative.      Objective:  BP 110/60   Pulse 71   Temp 97.6 F (36.4 C)   Resp 15   Ht '5\' 3"'$  (1.6 m)   Wt 123 lb (55.8 kg)   SpO2 96%   BMI 21.79 kg/m   BP/Weight 11/06/2020 10/15/2020 [id number]  Systolic BP [id number] [id number]  Diastolic BP 60 70 60  Wt. (Lbs) 123 128 124  BMI 21.79 22.67 22.68    Physical Exam    Lab Results  Component Value Date   WBC 8.9 07/09/2020   HGB 13.8 07/09/2020   HCT 43.0 07/09/2020   PLT 282 07/09/2020   GLUCOSE 90 07/09/2020   CHOL 176 07/09/2020   TRIG 136 07/09/2020   HDL 55 07/09/2020   LDLCALC 97 07/09/2020   ALT 14 07/09/2020   AST 19 07/09/2020   NA 139 07/09/2020   K 5.6 (H) 07/09/2020   CL 101 07/09/2020   CREATININE 1.71 (H) 07/09/2020   BUN 25 07/09/2020   CO2 26 07/09/2020   TSH 2.190 03/05/2017   INR 1.15 01/31/2017      Assessment & Plan:   1. Cystitis - POCT URINALYSIS DIP (CLINITEK) - Urine Culture - Comprehensive metabolic panel - Ambulatory referral to Urology - cefTRIAXone (ROCEPHIN) injection 1 g  contact [facility] about culture of salmonella Treat with rocephin 1gm IM x 3 days Refer to urology for recurrent infections with salmonella    Orders Placed This Encounter  Procedures  . Urine Culture  . Comprehensive metabolic panel  . Ambulatory referral to Urology  . POCT URINALYSIS DIP (CLINITEK)     Follow-up: Return in about 1 day (around 11/07/2020) for for rocephin.  An After Visit Summary was printed and given to the patient.  Reinaldo Meeker, MD [facility] [phone]

## 2020-11-07 ENCOUNTER — Ambulatory Visit (INDEPENDENT_AMBULATORY_CARE_PROVIDER_SITE_OTHER): Payer: Medicare PPO

## 2020-11-07 DIAGNOSIS — N3001 Acute cystitis with hematuria: Secondary | ICD-10-CM | POA: Diagnosis not present

## 2020-11-07 MED ORDER — CEFTRIAXONE SODIUM 1 G IJ SOLR
1.0000 g | Freq: Once | INTRAMUSCULAR | Status: AC
  Administered 2020-11-07: 1 g via INTRAMUSCULAR

## 2020-11-07 NOTE — Progress Notes (Signed)
Kidney tests stage 4, potassium high 5.9, liver tests normal needs nephrology consult. lp

## 2020-11-08 ENCOUNTER — Other Ambulatory Visit: Payer: Self-pay

## 2020-11-08 ENCOUNTER — Ambulatory Visit (INDEPENDENT_AMBULATORY_CARE_PROVIDER_SITE_OTHER): Payer: Medicare PPO

## 2020-11-08 DIAGNOSIS — N3001 Acute cystitis with hematuria: Secondary | ICD-10-CM | POA: Diagnosis not present

## 2020-11-08 MED ORDER — CEFTRIAXONE SODIUM 1 G IJ SOLR
1.0000 g | Freq: Once | INTRAMUSCULAR | Status: AC
  Administered 2020-11-08: 1 g via INTRAMUSCULAR

## 2020-11-08 NOTE — Progress Notes (Signed)
   Rocephin injection given per order, patient tolerated well.   Erie Noe, LPN [id number] AM

## 2020-11-12 LAB — URINE CULTURE

## 2020-11-12 NOTE — Progress Notes (Signed)
Culture e. Coli sensitive to rocephin lp

## 2020-11-19 ENCOUNTER — Ambulatory Visit (INDEPENDENT_AMBULATORY_CARE_PROVIDER_SITE_OTHER): Payer: Medicare PPO | Admitting: Legal Medicine

## 2020-11-19 ENCOUNTER — Other Ambulatory Visit: Payer: Self-pay

## 2020-11-19 ENCOUNTER — Encounter: Payer: Self-pay | Admitting: Legal Medicine

## 2020-11-19 VITALS — BP 120/70 | HR 71 | Temp 97.3°F | Resp 16 | Ht 63.0 in | Wt 124.0 lb

## 2020-11-19 DIAGNOSIS — N390 Urinary tract infection, site not specified: Secondary | ICD-10-CM | POA: Diagnosis not present

## 2020-11-19 DIAGNOSIS — F5101 Primary insomnia: Secondary | ICD-10-CM | POA: Diagnosis not present

## 2020-11-19 LAB — POCT URINALYSIS DIP (CLINITEK)
Bilirubin, UA: NEGATIVE
Blood, UA: NEGATIVE
Glucose, UA: NEGATIVE mg/dL
Ketones, POC UA: NEGATIVE mg/dL
Nitrite, UA: NEGATIVE
Spec Grav, UA: 1.015 (ref 1.010–1.025)
Urobilinogen, UA: 0.2 E.U./dL
pH, UA: 6 (ref 5.0–8.0)

## 2020-11-19 MED ORDER — LORAZEPAM 0.5 MG PO TABS: 0.5000 mg | ORAL_TABLET | Freq: Two times a day (BID) | ORAL | 1 refills | Status: DC | PRN

## 2020-11-19 NOTE — Progress Notes (Signed)
Subjective:  Patient ID: Tracy Vasquez, female    DOB: 1930/06/19  Age: 87 y.o. MRN: [id number]  Chief Complaint  Patient presents with  . UTI follow up    HPI: follow up of uTI after IM rocephin x 3 days. She is feeling well.  No fever or chills.  No dysuria or frequency.   Current Outpatient Medications on File Prior to Visit  Medication Sig Dispense Refill  . amiodarone (PACERONE) 200 MG tablet TAKE 1 TABLET ON MONDAY, TUESDAY, WEDNESDAY, THURSDAY, FRIDAY, AND SATURDAY. DO NOT TAKE ANY ON Sunday. 90 tablet 2  . digoxin (LANOXIN) 0.125 MG tablet TAKE 1/2 TABLET EVERY DAY (NEED TO SCHEDULE YEARLY APPOINTMENT FOR REFILLS) 45 tablet 0  . metoprolol tartrate (LOPRESSOR) 100 MG tablet Take 1 tablet (100 mg total) by mouth 2 (two) times daily. Please make overdue appt with Dr. Lovena Le before anymore refills. Thank you 2nd attempt 30 tablet 0  . XARELTO 15 MG TABS tablet TAKE 1 TABLET EVERY DAY WITH SUPPER 90 tablet 0   No current facility-administered medications on file prior to visit.   Past Medical History:  Diagnosis Date  . Acute gangrenous appendicitis with perforation and peritonitis 11/22/2011  . Afib ([place]) 2008  . CVA (cerebral infarction) 2008   no residual deficits  . Osteoporosis    Past Surgical History:  Procedure Laterality Date  . INSERT / REPLACE / REMOVE PACEMAKER    . LAPAROSCOPIC APPENDECTOMY  11/22/2011   Procedure: APPENDECTOMY LAPAROSCOPIC;  Surgeon: Stark Klein, MD;  Location: [place];  Service: General;  Laterality: N/A;  . PACEMAKER INSERTION    . PPM GENERATOR CHANGEOUT N/A 08/16/2017   Procedure: PPM GENERATOR CHANGEOUT;  Surgeon: Evans Lance, MD;  Location: [facility];  Service: Cardiovascular;  Laterality: N/A;  . RIGHT/LEFT HEART CATH AND CORONARY ANGIOGRAPHY N/A 02/01/2017   Procedure: Right/Left Heart Cath and Coronary Angiography;  Surgeon: Troy Sine, MD;  Location: [facility];  Service: Cardiovascular;  Laterality: N/A;  .  TONSILLECTOMY AND ADENOIDECTOMY  1935    Family History  Problem Relation Age of Onset  . Coronary artery disease Mother   . Heart attack Mother   . Atrial fibrillation Father   . Lung cancer Father   . Alcohol abuse Brother   . Failure to thrive Brother    Social History   Socioeconomic History  . Marital status: Widowed    Spouse name: Not on file  . Number of children: 3  . Years of education: Not on file  . Highest education level: Not on file  Occupational History  . Occupation: Retired  Tobacco Use  . Smoking status: Former Smoker    Types: Cigarettes    Quit date: 1975    Years since quitting: 47.4  . Smokeless tobacco: Never Used  Vaping Use  . Vaping Use: Never used  Substance and Sexual Activity  . Alcohol use: No  . Drug use: No  . Sexual activity: Not Currently  Other Topics Concern  . Not on file  Social History Narrative   Lives with dtr, moved to [place] from [facility] in 2014, widowed early 2013 (50 years married)   Social Determinants of Health   Financial Resource Strain: Not on file  Food Insecurity: Not on file  Transportation Needs: Not on file  Physical Activity: Not on file  Stress: Not on file  Social Connections: Not on file    Review of Systems  Constitutional: Negative for  activity change and appetite change.  HENT: Negative for congestion.   Respiratory: Negative for chest tightness and shortness of breath.   Cardiovascular: Negative for chest pain, palpitations and leg swelling.  Gastrointestinal: Negative for abdominal distention and abdominal pain.  Genitourinary: Negative for difficulty urinating, dysuria and urgency.  Musculoskeletal: Negative for arthralgias and back pain.  Neurological: Negative.   Psychiatric/Behavioral: Negative.      Objective:  BP 120/70   Pulse 71   Temp (!) 97.3 F (36.3 C)   Resp 16   Ht '5\' 3"'$  (1.6 m)   Wt 124 lb (56.2 kg)   SpO2 96%   BMI 21.97 kg/m   BP/Weight 11/19/2020 11/06/2020  [id number]  Systolic BP 123456 A999333 A999333  Diastolic BP 70 60 70  Wt. (Lbs) 124 123 128  BMI 21.97 21.79 22.67    Physical Exam Vitals reviewed.  Constitutional:      Appearance: Normal appearance. She is normal weight.  HENT:     Right Ear: Tympanic membrane normal.     Left Ear: Tympanic membrane normal.  Eyes:     Extraocular Movements: Extraocular movements intact.     Conjunctiva/sclera: Conjunctivae normal.     Pupils: Pupils are equal, round, and reactive to light.  Cardiovascular:     Rate and Rhythm: Normal rate and regular rhythm.     Pulses: Normal pulses.     Heart sounds: Normal heart sounds. No murmur heard. No gallop.   Pulmonary:     Effort: Pulmonary effort is normal. No respiratory distress.     Breath sounds: Normal breath sounds. No rales.  Abdominal:     General: Abdomen is flat. Bowel sounds are normal. There is no distension.     Palpations: Abdomen is soft.     Tenderness: There is no abdominal tenderness.  Skin:    Capillary Refill: Capillary refill takes less than 2 seconds.  Neurological:     General: No focal deficit present.     Mental Status: She is alert and oriented to person, place, and time. Mental status is at baseline.       Lab Results  Component Value Date   WBC 8.9 07/09/2020   HGB 13.8 07/09/2020   HCT 43.0 07/09/2020   PLT 282 07/09/2020   GLUCOSE 82 11/06/2020   CHOL 176 07/09/2020   TRIG 136 07/09/2020   HDL 55 07/09/2020   LDLCALC 97 07/09/2020   ALT 9 11/06/2020   AST 15 11/06/2020   NA 136 11/06/2020   K 5.9 (H) 11/06/2020   CL 100 11/06/2020   CREATININE 2.05 (H) 11/06/2020   BUN 29 (H) 11/06/2020   CO2 22 11/06/2020   TSH 2.190 03/05/2017   INR 1.15 01/31/2017      Assessment & Plan:   Diagnoses and all orders for this visit: Recurrent UTI We will check culture of urine       Follow-up: Return as scheduled.  An After Visit Summary was printed and given to the patient.  Reinaldo Meeker, MD [facility] [phone]

## 2020-11-21 ENCOUNTER — Ambulatory Visit (INDEPENDENT_AMBULATORY_CARE_PROVIDER_SITE_OTHER): Payer: Medicare PPO

## 2020-11-21 ENCOUNTER — Other Ambulatory Visit: Payer: Self-pay | Admitting: Internal Medicine

## 2020-11-21 DIAGNOSIS — I495 Sick sinus syndrome: Secondary | ICD-10-CM | POA: Diagnosis not present

## 2020-11-21 LAB — CUP PACEART REMOTE DEVICE CHECK
Battery Remaining Longevity: 48 mo
Battery Remaining Percentage: 77 %
Brady Statistic RA Percent Paced: 1 %
Brady Statistic RV Percent Paced: 89 %
Date Time Interrogation Session: 20220519032700
Implantable Lead Implant Date: 20080827
Implantable Lead Implant Date: 20080827
Implantable Lead Location: 753859
Implantable Lead Location: 753860
Implantable Lead Model: 4135
Implantable Lead Model: 4136
Implantable Lead Serial Number: 28356762
Implantable Lead Serial Number: 28365470
Implantable Pulse Generator Implant Date: 20190211
Lead Channel Impedance Value: 490 Ohm
Lead Channel Impedance Value: 627 Ohm
Lead Channel Pacing Threshold Amplitude: 0.7 V
Lead Channel Pacing Threshold Pulse Width: 0.4 ms
Lead Channel Setting Pacing Amplitude: 2 V
Lead Channel Setting Pacing Amplitude: 2.4 V
Lead Channel Setting Pacing Pulse Width: 0.4 ms
Lead Channel Setting Sensing Sensitivity: 2.5 mV
Pulse Gen Serial Number: 396317

## 2020-11-21 LAB — URINE CULTURE

## 2020-11-21 NOTE — Telephone Encounter (Signed)
12f 56.2kg, scr 2.05 11/06/20, lovw/taylor 10/20/19, ccr 16.5

## 2020-11-21 NOTE — Progress Notes (Signed)
Urine culture negative ?lp

## 2020-11-25 ENCOUNTER — Other Ambulatory Visit: Payer: Self-pay | Admitting: Internal Medicine

## 2020-11-29 ENCOUNTER — Ambulatory Visit (INDEPENDENT_AMBULATORY_CARE_PROVIDER_SITE_OTHER): Payer: Medicare PPO

## 2020-11-29 DIAGNOSIS — N390 Urinary tract infection, site not specified: Secondary | ICD-10-CM | POA: Diagnosis not present

## 2020-11-29 LAB — POCT URINALYSIS DIP (CLINITEK)
Bilirubin, UA: NEGATIVE
Glucose, UA: NEGATIVE mg/dL
Ketones, POC UA: NEGATIVE mg/dL
Nitrite, UA: NEGATIVE
POC PROTEIN,UA: 30 — AB
Spec Grav, UA: 1.01 (ref 1.010–1.025)
Urobilinogen, UA: 0.2 E.U./dL
pH, UA: 6 (ref 5.0–8.0)

## 2020-11-29 MED ORDER — PHENAZOPYRIDINE HCL 200 MG PO TABS: 200.0000 mg | ORAL_TABLET | Freq: Three times a day (TID) | ORAL | 0 refills | Status: DC | PRN

## 2020-11-29 NOTE — Progress Notes (Signed)
Patient is here to check urine. She mentioned had dysuria last night. She feels better today. Dr Henrene Pastor ordered UA and Urine Culture. Patient has an appointment with urologist on 12/19/2020. I sent phenazopyridine to pharmacy per Dr Henrene Pastor. We will wait for urine culture results.

## 2020-12-04 ENCOUNTER — Other Ambulatory Visit: Payer: Self-pay | Admitting: Legal Medicine

## 2020-12-04 ENCOUNTER — Other Ambulatory Visit: Payer: Self-pay

## 2020-12-04 DIAGNOSIS — N390 Urinary tract infection, site not specified: Secondary | ICD-10-CM

## 2020-12-04 LAB — URINE CULTURE

## 2020-12-04 MED ORDER — SULFAMETHOXAZOLE-TRIMETHOPRIM 800-160 MG PO TABS: 1.0000 | ORAL_TABLET | Freq: Two times a day (BID) | ORAL | 0 refills | Status: DC

## 2020-12-04 NOTE — Progress Notes (Signed)
Culture E.coli sensitive to sulfa drugs- called in lp

## 2020-12-11 DIAGNOSIS — N184 Chronic kidney disease, stage 4 (severe): Secondary | ICD-10-CM | POA: Diagnosis not present

## 2020-12-13 NOTE — Progress Notes (Signed)
Remote pacemaker transmission.   

## 2020-12-17 LAB — STATE LABORATORY REPORT

## 2020-12-19 DIAGNOSIS — I129 Hypertensive chronic kidney disease with stage 1 through stage 4 chronic kidney disease, or unspecified chronic kidney disease: Secondary | ICD-10-CM | POA: Diagnosis not present

## 2020-12-19 DIAGNOSIS — N3020 Other chronic cystitis without hematuria: Secondary | ICD-10-CM | POA: Diagnosis not present

## 2020-12-19 DIAGNOSIS — I5022 Chronic systolic (congestive) heart failure: Secondary | ICD-10-CM | POA: Diagnosis not present

## 2020-12-19 DIAGNOSIS — E875 Hyperkalemia: Secondary | ICD-10-CM | POA: Diagnosis not present

## 2020-12-19 DIAGNOSIS — N184 Chronic kidney disease, stage 4 (severe): Secondary | ICD-10-CM | POA: Diagnosis not present

## 2020-12-19 DIAGNOSIS — I480 Paroxysmal atrial fibrillation: Secondary | ICD-10-CM | POA: Diagnosis not present

## 2021-01-01 ENCOUNTER — Other Ambulatory Visit: Payer: Self-pay

## 2021-01-01 DIAGNOSIS — E875 Hyperkalemia: Secondary | ICD-10-CM

## 2021-01-03 ENCOUNTER — Other Ambulatory Visit: Payer: Self-pay

## 2021-01-03 ENCOUNTER — Other Ambulatory Visit: Payer: Medicare PPO

## 2021-01-03 DIAGNOSIS — E875 Hyperkalemia: Secondary | ICD-10-CM | POA: Diagnosis not present

## 2021-01-15 ENCOUNTER — Ambulatory Visit: Payer: Medicare PPO | Admitting: Legal Medicine

## 2021-01-15 ENCOUNTER — Telehealth: Payer: Self-pay

## 2021-01-15 ENCOUNTER — Encounter: Payer: Self-pay | Admitting: Legal Medicine

## 2021-01-15 ENCOUNTER — Other Ambulatory Visit: Payer: Self-pay

## 2021-01-15 VITALS — BP 130/74 | HR 68 | Temp 97.7°F | Resp 16 | Ht 63.0 in | Wt 128.0 lb

## 2021-01-15 DIAGNOSIS — I482 Chronic atrial fibrillation, unspecified: Secondary | ICD-10-CM | POA: Diagnosis not present

## 2021-01-15 DIAGNOSIS — I700 Atherosclerosis of aorta: Secondary | ICD-10-CM

## 2021-01-15 DIAGNOSIS — N184 Chronic kidney disease, stage 4 (severe): Secondary | ICD-10-CM | POA: Diagnosis not present

## 2021-01-15 DIAGNOSIS — Z7901 Long term (current) use of anticoagulants: Secondary | ICD-10-CM

## 2021-01-15 DIAGNOSIS — I5022 Chronic systolic (congestive) heart failure: Secondary | ICD-10-CM

## 2021-01-15 DIAGNOSIS — M8000XD Age-related osteoporosis with current pathological fracture, unspecified site, subsequent encounter for fracture with routine healing: Secondary | ICD-10-CM

## 2021-01-15 DIAGNOSIS — E782 Mixed hyperlipidemia: Secondary | ICD-10-CM

## 2021-01-15 DIAGNOSIS — M810 Age-related osteoporosis without current pathological fracture: Secondary | ICD-10-CM

## 2021-01-15 NOTE — Progress Notes (Signed)
Established Patient Office Visit  Subjective:  Patient ID: Tracy Vasquez, female    DOB: May 28, 1930  Age: 87 y.o. MRN: [id number]  CC:  Chief Complaint  Patient presents with   Hyperlipidemia   Atrial Fibrillation    HPI Tracy Vasquez presents for Chronic visit  Patient presents with hyperlipidemia.  Compliance with treatment has been good; patient takes medicines as directed, maintains low cholesterol diet, follows up as directed, and maintains exercise regimen.  Patient is using diet without problems.   Patient has a diagnosis of permanent atrial fibrillation.   Patient is on xarelto and has controlled ventricular response.  Patient is CV stable  Stage 4 Renal failure- seeing nephrology..   Past Medical History:  Diagnosis Date   Acute gangrenous appendicitis with perforation and peritonitis 11/22/2011   Afib (HCC) 2008   CVA (cerebral infarction) 2008   no residual deficits   Osteoporosis     Past Surgical History:  Procedure Laterality Date   INSERT / REPLACE / REMOVE PACEMAKER     LAPAROSCOPIC APPENDECTOMY  11/22/2011   Procedure: APPENDECTOMY LAPAROSCOPIC;  Surgeon: Almond Lint, MD;  Location: [facility] OR;  Service: General;  Laterality: N/A;   PACEMAKER INSERTION     PPM GENERATOR CHANGEOUT N/A 08/16/2017   Procedure: PPM GENERATOR CHANGEOUT;  Surgeon: Marinus Maw, MD;  Location: [facility] INVASIVE CV LAB;  Service: Cardiovascular;  Laterality: N/A;   RIGHT/LEFT HEART CATH AND CORONARY ANGIOGRAPHY N/A 02/01/2017   Procedure: Right/Left Heart Cath and Coronary Angiography;  Surgeon: Lennette Bihari, MD;  Location: [facility] INVASIVE CV LAB;  Service: Cardiovascular;  Laterality: N/A;   TONSILLECTOMY AND ADENOIDECTOMY  1935    Family History  Problem Relation Age of Onset   Coronary artery disease Mother    Heart attack Mother    Atrial fibrillation Father    Lung cancer Father    Alcohol abuse Brother    Failure to thrive Brother     Social History   Socioeconomic History    Marital status: Widowed    Spouse name: Not on file   Number of children: 3   Years of education: Not on file   Highest education level: Not on file  Occupational History   Occupation: Retired  Tobacco Use   Smoking status: Former    Pack years: 0.00    Types: Cigarettes    Quit date: 1975    Years since quitting: 47.5   Smokeless tobacco: Never  Vaping Use   Vaping Use: Never used  Substance and Sexual Activity   Alcohol use: No   Drug use: No   Sexual activity: Not Currently  Other Topics Concern   Not on file  Social History Narrative   Lives with dtr, moved to [place] from [place] in 2014, widowed early 2013 (60 years married)   Social Determinants of Corporate investment banker Strain: Not on file  Food Insecurity: Not on file  Transportation Needs: Not on file  Physical Activity: Not on file  Stress: Not on file  Social Connections: Not on file  Intimate Partner Violence: Not on file    Outpatient Medications Prior to Visit  Medication Sig Dispense Refill   amiodarone (PACERONE) 200 MG tablet TAKE 1 TABLET ON MONDAY, TUESDAY, WEDNESDAY, THURSDAY, FRIDAY, AND SATURDAY. DO NOT TAKE ANY ON Sunday. 90 tablet 2   digoxin (LANOXIN) 0.125 MG tablet Take 0.5 tablets (0.0625 mg total) by mouth daily. Please keep upcoming appt in July 2022 with Dr. Ladona Ridgel  before anymore refills. Thank you 45 tablet 0   LORazepam (ATIVAN) 0.5 MG tablet Take 1 tablet (0.5 mg total) by mouth 3 times/day as needed-between meals & bedtime for sleep. 30 tablet 1   metoprolol tartrate (LOPRESSOR) 100 MG tablet Take 1 tablet (100 mg total) by mouth 2 (two) times daily. Please make overdue appt with Dr. Ladona Ridgel before anymore refills. Thank you 2nd attempt 30 tablet 0   XARELTO 15 MG TABS tablet TAKE 1 TABLET EVERY DAY WITH SUPPER 90 tablet 0   phenazopyridine (PYRIDIUM) 200 MG tablet Take 1 tablet (200 mg total) by mouth 3 (three) times daily as needed for pain. 10 tablet 0    sulfamethoxazole-trimethoprim (BACTRIM DS) 800-160 MG tablet Take 1 tablet by mouth 2 (two) times daily. 14 tablet 0   No facility-administered medications prior to visit.    No Known Allergies  ROS Review of Systems  Constitutional:  Negative for activity change and appetite change.  HENT:  Negative for congestion.   Respiratory:  Negative for choking, chest tightness and shortness of breath.   Cardiovascular:  Negative for chest pain, palpitations and leg swelling.  Gastrointestinal:  Negative for abdominal distention and abdominal pain.  Genitourinary:  Negative for difficulty urinating.  Musculoskeletal:  Negative for arthralgias and back pain.  Neurological: Negative.   Psychiatric/Behavioral: Negative.       Objective:    Physical Exam Vitals reviewed.  Constitutional:      Appearance: Normal appearance. She is normal weight.  HENT:     Head: Normocephalic.     Right Ear: Tympanic membrane, ear canal and external ear normal.     Left Ear: Tympanic membrane, ear canal and external ear normal.     Mouth/Throat:     Mouth: Mucous membranes are moist.  Eyes:     Extraocular Movements: Extraocular movements intact.     Conjunctiva/sclera: Conjunctivae normal.     Pupils: Pupils are equal, round, and reactive to light.  Cardiovascular:     Rate and Rhythm: Normal rate. Rhythm irregular.     Pulses: Normal pulses.     Heart sounds: Normal heart sounds. No murmur heard.   No gallop.  Pulmonary:     Effort: Pulmonary effort is normal. No respiratory distress.     Breath sounds: Normal breath sounds. No wheezing.  Abdominal:     General: Abdomen is flat. Bowel sounds are normal. There is no distension.     Palpations: Abdomen is soft.     Tenderness: There is no abdominal tenderness.  Musculoskeletal:     Cervical back: Normal range of motion and neck supple.     Right lower leg: No edema.     Left lower leg: No edema.  Skin:    General: Skin is warm.     Capillary  Refill: Capillary refill takes less than 2 seconds.  Neurological:     General: No focal deficit present.     Mental Status: She is alert.     Motor: Weakness (generaized) present.    BP 130/74   Pulse 68   Temp 97.7 F (36.5 C)   Resp 16   Ht 5\' 3"  (1.6 m)   Wt 128 lb (58.1 kg)   SpO2 97%   BMI 22.67 kg/m  Wt Readings from Last 3 Encounters:  01/15/21 128 lb (58.1 kg)  11/19/20 124 lb (56.2 kg)  11/06/20 123 lb (55.8 kg)     Health Maintenance Due  Topic Date Due  Zoster Vaccines- Shingrix (1 of 2) Never done   PNA vac Low Risk Adult (2 of 2 - PCV13) 07/07/2007   COVID-19 Vaccine (3 - Booster for Moderna series) 08/16/2020    There are no preventive care reminders to display for this patient.  Lab Results  Component Value Date   TSH 2.190 03/05/2017   Lab Results  Component Value Date   WBC 8.9 07/09/2020   HGB 13.8 07/09/2020   HCT 43.0 07/09/2020   MCV 90 07/09/2020   PLT 282 07/09/2020   Lab Results  Component Value Date   NA 136 11/06/2020   K 5.9 (H) 11/06/2020   CO2 22 11/06/2020   GLUCOSE 82 11/06/2020   BUN 29 (H) 11/06/2020   CREATININE 2.05 (H) 11/06/2020   BILITOT 0.4 11/06/2020   ALKPHOS 76 11/06/2020   AST 15 11/06/2020   ALT 9 11/06/2020   PROT 7.6 11/06/2020   ALBUMIN 4.3 11/06/2020   CALCIUM 9.6 11/06/2020   ANIONGAP 6 02/03/2017   EGFR 23 (L) 11/06/2020   GFR 35.27 (L) 04/25/2014   Lab Results  Component Value Date   CHOL 176 07/09/2020   Lab Results  Component Value Date   HDL 55 07/09/2020   Lab Results  Component Value Date   LDLCALC 97 07/09/2020   Lab Results  Component Value Date   TRIG 136 07/09/2020   Lab Results  Component Value Date   CHOLHDL 3.2 07/09/2020   No results found for: HGBA1C    Assessment & Plan:   Problem List Items Addressed This Visit       Cardiovascular and Mediastinum   Aortic atherosclerosis ([place]) - Primary (Chronic) Patient has aortic atherosclerosis found on CT scan     Chronic atrial fibrillation ([place])   Relevant Orders   Digoxin level Patient has a diagnosis of permanent atrial fibrillation.   Patient is on xarelto and has controlled ventricular response.  Patient is CV unstable .    Chronic systolic CHF (congestive heart failure) (HCC) An individualized care plan was established and reinforced.  The patient's disease status was assessed using clinical finding son exam today, labs, and/or other diagnostic testing such as x-rays, to determine the patient's success in meeting treatmentgoalsbased on disease-based guidelines and found to beimproving. But not at goal yet. Medications prescriptions nochanges Laboratory tests ordered to be performed today include routine. RECOMMENDATIONS: given include see cardiology.  Call physician is patient gains 3 lbs in one day or 5 lbs for one week.  Call for progressive PND, orthopnea or increased pedal edema.      Musculoskeletal and Integument   Osteoporosis   Age-related osteoporosis without current pathological fracture AN INDIVIDUAL CARE PLAN for osteoporosis was established and reinforced today.  The patient's status was assessed using clinical findings on exam, labs, and other diagnostic testing. Patient's success at meeting treatment goals based on disease specific evidence-bassed guidelines and found to be in fair control. RECOMMENDATIONS include maintaining present medicines and treatment.      Genitourinary   Chronic kidney disease, stage 4 (severe) ([place])   Relevant Orders   CBC with Differential/Platelet AN INDIVIDUAL CARE PLAN for renal failure was established and reinforced today.  The patient's status was assessed using clinical findings on exam, labs, and other diagnostic testing. Patient's success at meeting treatment goals based on disease specific evidence-bassed guidelines and found to be in fair control. RECOMMENDATIONS include maintaining present medicines and treatment.      Other   Current use of  long term anticoagulation (Chronic) Patient on chronic anticoagulants for atrial fibrillation   Other Visit Diagnoses     Mixed hyperlipidemia       Relevant Orders   Lipid panel AN INDIVIDUAL CARE PLAN for hyperlipidemia/ cholesterol was established and reinforced today.  The patient's status was assessed using clinical findings on exam, lab and other diagnostic tests. The patient's disease status was assessed based on evidence-based guidelines and found to be fair controlled. MEDICATIONS were reviewed. SELF MANAGEMENT GOALS have been discussed and patient's success at attaining the goal of low cholesterol was assessed. RECOMMENDATION given include regular exercise 3 days a week and low cholesterol/low fat diet. CLINICAL SUMMARY including written plan to identify barriers unique to the patient due to social or economic  reasons was discussed.           Follow-up: Return in about 3 months (around 04/17/2021) for chf.    Reinaldo Meeker, MD

## 2021-01-15 NOTE — Telephone Encounter (Signed)
I left a message on the number listed in the chart stating that the card that was given to her at the office will be the best number to contact regarding her balance error. I verified with the practice admin who recommend for her to call [place] billing.

## 2021-01-16 ENCOUNTER — Other Ambulatory Visit: Payer: Self-pay

## 2021-01-16 DIAGNOSIS — I482 Chronic atrial fibrillation, unspecified: Secondary | ICD-10-CM

## 2021-01-16 LAB — CBC WITH DIFFERENTIAL/PLATELET
Basophils Absolute: 0.1 10*3/uL (ref 0.0–0.2)
Basos: 1 %
EOS (ABSOLUTE): 0.1 10*3/uL (ref 0.0–0.4)
Eos: 1 %
Hematocrit: 39.9 % (ref 34.0–46.6)
Hemoglobin: 12.9 g/dL (ref 11.1–15.9)
Immature Grans (Abs): 0.1 10*3/uL (ref 0.0–0.1)
Immature Granulocytes: 1 %
Lymphocytes Absolute: 2.5 10*3/uL (ref 0.7–3.1)
Lymphs: 25 %
MCH: 29.9 pg (ref 26.6–33.0)
MCHC: 32.3 g/dL (ref 31.5–35.7)
MCV: 92 fL (ref 79–97)
Monocytes Absolute: 0.8 10*3/uL (ref 0.1–0.9)
Monocytes: 9 %
Neutrophils Absolute: 6.3 10*3/uL (ref 1.4–7.0)
Neutrophils: 63 %
Platelets: 258 10*3/uL (ref 150–450)
RBC: 4.32 x10E6/uL (ref 3.77–5.28)
RDW: 13.5 % (ref 11.7–15.4)
WBC: 9.8 10*3/uL (ref 3.4–10.8)

## 2021-01-16 LAB — LIPID PANEL
Chol/HDL Ratio: 3.8 ratio (ref 0.0–4.4)
Cholesterol, Total: 178 mg/dL (ref 100–199)
HDL: 47 mg/dL (ref >39)
LDL Chol Calc (NIH): 105 mg/dL — ABNORMAL HIGH (ref 0–99)
Triglycerides: 147 mg/dL (ref 0–149)
VLDL Cholesterol Cal: 26 mg/dL (ref 5–40)

## 2021-01-16 LAB — CARDIOVASCULAR RISK ASSESSMENT

## 2021-01-16 LAB — DIGOXIN LEVEL: Digoxin, Serum: 1.7 ng/mL — ABNORMAL HIGH (ref 0.5–0.9)

## 2021-01-16 NOTE — Progress Notes (Signed)
Digoxin 1.7 high decrease to one pill every other day, recheck level 1 month, LDL cholesterol 105 high, CBC normal,  lp

## 2021-01-28 ENCOUNTER — Encounter: Payer: Self-pay | Admitting: Internal Medicine

## 2021-01-28 ENCOUNTER — Other Ambulatory Visit: Payer: Self-pay

## 2021-01-28 ENCOUNTER — Ambulatory Visit: Payer: Medicare PPO | Admitting: Internal Medicine

## 2021-01-28 VITALS — BP 146/90 | HR 70 | Ht 63.0 in | Wt 130.8 lb

## 2021-01-28 DIAGNOSIS — I495 Sick sinus syndrome: Secondary | ICD-10-CM

## 2021-01-28 DIAGNOSIS — Z950 Presence of cardiac pacemaker: Secondary | ICD-10-CM

## 2021-01-28 DIAGNOSIS — I5022 Chronic systolic (congestive) heart failure: Secondary | ICD-10-CM

## 2021-01-28 HISTORY — DX: Sick sinus syndrome: I49.5

## 2021-01-28 NOTE — Patient Instructions (Signed)
Medication Instructions:  Your physician recommends that you continue on your current medications as directed. Please refer to the Current Medication list given to you today.  Labwork: None ordered.  Testing/Procedures: None ordered.  Follow-Up: Your physician wants you to follow-up in: one year with Cristopher Peru, MD or one of the following Advanced Practice Providers on your designated Care Team:   Tommye Standard, [place] Legrand Como "Jonni Sanger" Chalmers Cater, [place]  Remote monitoring is used to monitor your Pacemaker from home. This monitoring reduces the number of office visits required to check your device to one time per year. It allows [place] to keep an eye on the functioning of your device to ensure it is working properly. You are scheduled for a device check from home on 02/20/2021. You may send your transmission at any time that day. If you have a wireless device, the transmission will be sent automatically. After your physician reviews your transmission, you will receive a postcard with your next transmission date.  Any Other Special Instructions Will Be Listed Below (If Applicable).  If you need a refill on your cardiac medications before your next appointment, please call your pharmacy.

## 2021-01-28 NOTE — Progress Notes (Signed)
HPI Tracy Vasquez returns today for followup. She is a pleasant 87 yo woman with a h/o HTN, persistent atrial fib, and CHB, s/p PPM insertion. She denies chest pain, sob, peripheral edema, and has not had syncope. She remains active.  No Known Allergies   Current Outpatient Medications  Medication Sig Dispense Refill   amiodarone (PACERONE) 200 MG tablet TAKE 1 TABLET ON MONDAY, TUESDAY, WEDNESDAY, THURSDAY, FRIDAY, AND SATURDAY. DO NOT TAKE ANY ON Sunday. 90 tablet 2   digoxin (LANOXIN) 0.125 MG tablet Take 0.5 tablets (0.0625 mg total) by mouth daily. Please keep upcoming appt in July 2022 with Dr. Lovena Le before anymore refills. Thank you 45 tablet 0   LORazepam (ATIVAN) 0.5 MG tablet Take 1 tablet (0.5 mg total) by mouth 3 times/day as needed-between meals & bedtime for sleep. 30 tablet 1   metoprolol tartrate (LOPRESSOR) 100 MG tablet Take 1 tablet (100 mg total) by mouth 2 (two) times daily. Please make overdue appt with Dr. Lovena Le before anymore refills. Thank you 2nd attempt 30 tablet 0   sodium zirconium cyclosilicate (LOKELMA) 10 g PACK packet Take 10 g by mouth 2 (two) times a week.     XARELTO 15 MG TABS tablet TAKE 1 TABLET EVERY DAY WITH SUPPER 90 tablet 0   No current facility-administered medications for this visit.     Past Medical History:  Diagnosis Date   Acute gangrenous appendicitis with perforation and peritonitis 11/22/2011   Afib ([place]) 2008   CVA (cerebral infarction) 2008   no residual deficits   Osteoporosis     ROS:   All systems reviewed and negative except as noted in the HPI.   Past Surgical History:  Procedure Laterality Date   INSERT / REPLACE / REMOVE PACEMAKER     LAPAROSCOPIC APPENDECTOMY  11/22/2011   Procedure: APPENDECTOMY LAPAROSCOPIC;  Surgeon: Stark Klein, MD;  Location: [place];  Service: General;  Laterality: N/A;   PACEMAKER INSERTION     PPM GENERATOR CHANGEOUT N/A 08/16/2017   Procedure: PPM GENERATOR CHANGEOUT;  Surgeon:  Evans Lance, MD;  Location: [place] CV LAB;  Service: Cardiovascular;  Laterality: N/A;   RIGHT/LEFT HEART CATH AND CORONARY ANGIOGRAPHY N/A 02/01/2017   Procedure: Right/Left Heart Cath and Coronary Angiography;  Surgeon: Troy Sine, MD;  Location: [place] CV LAB;  Service: Cardiovascular;  Laterality: N/A;   TONSILLECTOMY AND ADENOIDECTOMY  1935     Family History  Problem Relation Age of Onset   Coronary artery disease Mother    Heart attack Mother    Atrial fibrillation Father    Lung cancer Father    Alcohol abuse Brother    Failure to thrive Brother      Social History   Socioeconomic History   Marital status: Widowed    Spouse name: Not on file   Number of children: 3   Years of education: Not on file   Highest education level: Not on file  Occupational History   Occupation: Retired  Tobacco Use   Smoking status: Former    Types: Cigarettes    Quit date: 1975    Years since quitting: 47.5   Smokeless tobacco: Never  Vaping Use   Vaping Use: Never used  Substance and Sexual Activity   Alcohol use: No   Drug use: No   Sexual activity: Not Currently  Other Topics Concern   Not on file  Social History Narrative   Lives with dtr, moved to [place]  from [facility] in 2014, widowed early 2013 (65 years married)   Social Determinants of Radio broadcast assistant Strain: Not on file  Food Insecurity: Not on file  Transportation Needs: Not on file  Physical Activity: Not on file  Stress: Not on file  Social Connections: Not on file  Intimate Partner Violence: Not on file     BP (!) 146/90   Pulse 70   Ht '5\' 3"'$  (1.6 m)   Wt 130 lb 12.8 oz (59.3 kg)   SpO2 92%   BMI 23.17 kg/m   Physical Exam:  Well appearing NAD HEENT: Unremarkable Neck:  No JVD, no thyromegally Lymphatics:  No adenopathy Back:  No CVA tenderness Lungs:  Clear with no wheezes HEART:  Regular rate rhythm, no murmurs, no rubs, no clicks Abd:  soft, positive bowel  sounds, no organomegally, no rebound, no guarding Ext:  2 plus pulses, no edema, no cyanosis, no clubbing Skin:  No rashes no nodules Neuro:  CN II through XII intact, motor grossly intact  EKG - atrial fib/flutter with a paced VR  DEVICE  Normal device function.  See PaceArt for details.   Assess/Plan:  Atrial fib - her VR is controlled. She will continue our current meds. HTN - her bp is elevated a bit. We will ask her to avoid salty foods. No change in her meds now. PPM - her [employer] DDD PM is working normally.  HTN - her sbp is up today. She is encouraged to reduce her salty foods.   Carleene Overlie TaylorMD

## 2021-02-17 ENCOUNTER — Other Ambulatory Visit: Payer: Self-pay

## 2021-02-17 ENCOUNTER — Other Ambulatory Visit: Payer: Medicare PPO

## 2021-02-17 DIAGNOSIS — I482 Chronic atrial fibrillation, unspecified: Secondary | ICD-10-CM | POA: Diagnosis not present

## 2021-02-18 ENCOUNTER — Other Ambulatory Visit: Payer: Self-pay | Admitting: Internal Medicine

## 2021-02-18 LAB — DIGOXIN LEVEL: Digoxin, Serum: 0.5 ng/mL (ref 0.5–0.9)

## 2021-02-18 NOTE — Progress Notes (Signed)
Digoxin level 0.5 ok lp

## 2021-02-20 ENCOUNTER — Ambulatory Visit (INDEPENDENT_AMBULATORY_CARE_PROVIDER_SITE_OTHER): Payer: Medicare PPO

## 2021-02-20 DIAGNOSIS — I495 Sick sinus syndrome: Secondary | ICD-10-CM | POA: Diagnosis not present

## 2021-02-20 LAB — CUP PACEART REMOTE DEVICE CHECK
Battery Remaining Longevity: 42 mo
Battery Remaining Percentage: 71 %
Brady Statistic RA Percent Paced: 0 %
Brady Statistic RV Percent Paced: 93 %
Date Time Interrogation Session: 20220818030000
Implantable Lead Implant Date: 20080827
Implantable Lead Implant Date: 20080827
Implantable Lead Location: 753859
Implantable Lead Location: 753860
Implantable Lead Model: 4135
Implantable Lead Model: 4136
Implantable Lead Serial Number: 28356762
Implantable Lead Serial Number: 28365470
Implantable Pulse Generator Implant Date: 20190211
Lead Channel Impedance Value: 494 Ohm
Lead Channel Impedance Value: 614 Ohm
Lead Channel Pacing Threshold Amplitude: 0.7 V
Lead Channel Pacing Threshold Pulse Width: 0.4 ms
Lead Channel Setting Pacing Amplitude: 2 V
Lead Channel Setting Pacing Amplitude: 2.4 V
Lead Channel Setting Pacing Pulse Width: 0.4 ms
Lead Channel Setting Sensing Sensitivity: 2.5 mV
Pulse Gen Serial Number: 396317

## 2021-02-25 ENCOUNTER — Other Ambulatory Visit: Payer: Medicare PPO

## 2021-02-25 ENCOUNTER — Other Ambulatory Visit: Payer: Self-pay

## 2021-02-25 DIAGNOSIS — N184 Chronic kidney disease, stage 4 (severe): Secondary | ICD-10-CM

## 2021-02-26 LAB — CBC WITH DIFFERENTIAL/PLATELET
Basophils Absolute: 0.1 10*3/uL (ref 0.0–0.2)
Basos: 1 %
EOS (ABSOLUTE): 0.1 10*3/uL (ref 0.0–0.4)
Eos: 1 %
Hematocrit: 37.4 % (ref 34.0–46.6)
Hemoglobin: 12.1 g/dL (ref 11.1–15.9)
Immature Grans (Abs): 0.1 10*3/uL (ref 0.0–0.1)
Immature Granulocytes: 1 %
Lymphocytes Absolute: 2.4 10*3/uL (ref 0.7–3.1)
Lymphs: 25 %
MCH: 29.4 pg (ref 26.6–33.0)
MCHC: 32.4 g/dL (ref 31.5–35.7)
MCV: 91 fL (ref 79–97)
Monocytes Absolute: 0.8 10*3/uL (ref 0.1–0.9)
Monocytes: 8 %
Neutrophils Absolute: 6.5 10*3/uL (ref 1.4–7.0)
Neutrophils: 64 %
Platelets: 226 10*3/uL (ref 150–450)
RBC: 4.12 x10E6/uL (ref 3.77–5.28)
RDW: 12.8 % (ref 11.7–15.4)
WBC: 9.8 10*3/uL (ref 3.4–10.8)

## 2021-02-26 LAB — RENAL FUNCTION PANEL
Albumin: 4.5 g/dL (ref 3.6–4.6)
BUN/Creatinine Ratio: 15 (ref 12–28)
BUN: 26 mg/dL (ref 8–27)
CO2: 20 mmol/L (ref 20–29)
Calcium: 9.9 mg/dL (ref 8.7–10.3)
Chloride: 103 mmol/L (ref 96–106)
Creatinine, Ser: 1.72 mg/dL — ABNORMAL HIGH (ref 0.57–1.00)
Glucose: 93 mg/dL (ref 65–99)
Phosphorus: 4 mg/dL (ref 3.0–4.3)
Potassium: 5.6 mmol/L — ABNORMAL HIGH (ref 3.5–5.2)
Sodium: 140 mmol/L (ref 134–144)
eGFR: 28 mL/min/{1.73_m2} — ABNORMAL LOW (ref >59)

## 2021-02-26 LAB — PROTEIN / CREATININE RATIO, URINE
Creatinine, Urine: 77.9 mg/dL
Protein, Ur: 51.3 mg/dL
Protein/Creat Ratio: 659 mg/g creat — ABNORMAL HIGH (ref 0–200)

## 2021-02-26 LAB — MAGNESIUM: Magnesium: 2 mg/dL (ref 1.6–2.3)

## 2021-02-26 NOTE — Progress Notes (Signed)
Protein.creatinine ratio is high 659 lp

## 2021-02-26 NOTE — Progress Notes (Signed)
Magnesium 2.0 good, CBC normal, Kidney tests stage  4, potassium high at 5.6,  lp

## 2021-03-05 ENCOUNTER — Telehealth (INDEPENDENT_AMBULATORY_CARE_PROVIDER_SITE_OTHER): Payer: Medicare PPO | Admitting: Legal Medicine

## 2021-03-05 ENCOUNTER — Encounter: Payer: Self-pay | Admitting: Legal Medicine

## 2021-03-05 VITALS — Ht 63.0 in | Wt 130.0 lb

## 2021-03-05 DIAGNOSIS — J020 Streptococcal pharyngitis: Secondary | ICD-10-CM | POA: Diagnosis not present

## 2021-03-05 DIAGNOSIS — U071 COVID-19: Secondary | ICD-10-CM | POA: Diagnosis not present

## 2021-03-05 MED ORDER — AMOXICILLIN 875 MG PO TABS: 875.0000 mg | ORAL_TABLET | Freq: Two times a day (BID) | ORAL | 0 refills | Status: AC

## 2021-03-05 MED ORDER — MOLNUPIRAVIR EUA 200MG CAPSULE: 4.0000 | ORAL_CAPSULE | Freq: Two times a day (BID) | ORAL | 0 refills | Status: AC

## 2021-03-05 NOTE — Progress Notes (Signed)
Virtual Visit via Video Note   This visit type was conducted due to national recommendations for restrictions regarding the COVID-19 Pandemic (e.g. social distancing) in an effort to limit this patient's exposure and mitigate transmission in our community.  Due to her co-morbid illnesses, this patient is at least at moderate risk for complications without adequate follow up.  This format is felt to be most appropriate for this patient at this time.  All issues noted in this document were discussed and addressed.  A limited physical exam was performed with this format.  A verbal consent was obtained for the virtual visit.   Date:  03/05/2021   ID:  Tracy Vasquez, DOB May 24, 1930, MRN [id number]  Patient Location: Home Provider Location: Home Office  PCP:  Lillard Anes, MD   Evaluation Performed:  New Patient Evaluation  Chief Complaint:  covid  History of Present Illness:    Tracy Vasquez is a 87 y.o. female with covid positive today. She had symptoms for 2 days and very sore throat since she went to church.  Low grade fever. COVID and STREP positive  The patient does have symptoms concerning for COVID-19 infection (fever, chills, cough, or new shortness of breath).    Past Medical History:  Diagnosis Date   Acute gangrenous appendicitis with perforation and peritonitis 11/22/2011   Afib ([place]) 2008   CVA (cerebral infarction) 2008   no residual deficits   Osteoporosis     Past Surgical History:  Procedure Laterality Date   INSERT / REPLACE / REMOVE PACEMAKER     LAPAROSCOPIC APPENDECTOMY  11/22/2011   Procedure: APPENDECTOMY LAPAROSCOPIC;  Surgeon: Stark Klein, MD;  Location: [place];  Service: General;  Laterality: N/A;   PACEMAKER INSERTION     PPM GENERATOR CHANGEOUT N/A 08/16/2017   Procedure: PPM GENERATOR CHANGEOUT;  Surgeon: Evans Lance, MD;  Location: [place] CV LAB;  Service: Cardiovascular;  Laterality: N/A;   RIGHT/LEFT HEART CATH AND CORONARY  ANGIOGRAPHY N/A 02/01/2017   Procedure: Right/Left Heart Cath and Coronary Angiography;  Surgeon: Troy Sine, MD;  Location: [place] CV LAB;  Service: Cardiovascular;  Laterality: N/A;   TONSILLECTOMY AND ADENOIDECTOMY  1935    Family History  Problem Relation Age of Onset   Coronary artery disease Mother    Heart attack Mother    Atrial fibrillation Father    Lung cancer Father    Alcohol abuse Brother    Failure to thrive Brother     Social History   Socioeconomic History   Marital status: Widowed    Spouse name: Not on file   Number of children: 3   Years of education: Not on file   Highest education level: Not on file  Occupational History   Occupation: Retired  Tobacco Use   Smoking status: Former    Types: Cigarettes    Quit date: 1975    Years since quitting: 47.6   Smokeless tobacco: Never  Vaping Use   Vaping Use: Never used  Substance and Sexual Activity   Alcohol use: No   Drug use: No   Sexual activity: Not Currently  Other Topics Concern   Not on file  Social History Narrative   Lives with dtr, moved to [place] from [place] in 2014, widowed early 2013 (13 years married)   Social Determinants of Radio broadcast assistant Strain: Not on file  Food Insecurity: Not on file  Transportation Needs: Not on file  Physical Activity: Not on  file  Stress: Not on file  Social Connections: Not on file  Intimate Partner Violence: Not on file    Outpatient Medications Prior to Visit  Medication Sig Dispense Refill   amiodarone (PACERONE) 200 MG tablet TAKE 1 TABLET ON MONDAY, TUESDAY, WEDNESDAY, THURSDAY, FRIDAY, [place]. DO NOT TAKE ANY ON SUNDAY. 78 tablet 3   digoxin (LANOXIN) 0.125 MG tablet Take 0.5 tablets (0.0625 mg total) by mouth daily. Please keep upcoming appt in July 2022 with Dr. Lovena Le before anymore refills. Thank you 45 tablet 0   LORazepam (ATIVAN) 0.5 MG tablet Take 1 tablet (0.5 mg total) by mouth 3 times/day as needed-between  meals & bedtime for sleep. 30 tablet 1   metoprolol tartrate (LOPRESSOR) 100 MG tablet Take 1 tablet (100 mg total) by mouth 2 (two) times daily. Please make overdue appt with Dr. Lovena Le before anymore refills. Thank you 2nd attempt 30 tablet 0   sodium zirconium cyclosilicate (LOKELMA) 10 g PACK packet Take 10 g by mouth 2 (two) times a week.     XARELTO 15 MG TABS tablet TAKE 1 TABLET EVERY DAY WITH SUPPER 90 tablet 0   No facility-administered medications prior to visit.    Allergies:   Patient has no known allergies.   Social History   Tobacco Use   Smoking status: Former    Types: Cigarettes    Quit date: 1975    Years since quitting: 47.6   Smokeless tobacco: Never  Vaping Use   Vaping Use: Never used  Substance Use Topics   Alcohol use: No   Drug use: No     Review of Systems  Constitutional:  Positive for fever and malaise/fatigue.  HENT:  Positive for congestion and sore throat.   Respiratory:  Positive for cough.   Cardiovascular:  Negative for orthopnea and PND.  Genitourinary: Negative.   Musculoskeletal:  Positive for myalgias.  Neurological:  Positive for headaches.    Labs/Other Tests and Data Reviewed:    Recent Labs: 11/06/2020: ALT 9 02/25/2021: BUN 26; Creatinine, Ser 1.72; Hemoglobin 12.1; Magnesium 2.0; Platelets 226; Potassium 5.6; Sodium 140   Recent Lipid Panel Lab Results  Component Value Date/Time   CHOL 178 01/15/2021 11:36 AM   TRIG 147 01/15/2021 11:36 AM   HDL 47 01/15/2021 11:36 AM   CHOLHDL 3.8 01/15/2021 11:36 AM   CHOLHDL 4 01/03/2014 12:05 PM   LDLCALC 105 (H) 01/15/2021 11:36 AM    Wt Readings from Last 3 Encounters:  03/05/21 130 lb (59 kg)  01/28/21 130 lb 12.8 oz (59.3 kg)  01/15/21 128 lb (58.1 kg)     Objective:    Vital Signs:  Ht '5\' 3"'$  (1.6 m)   Wt 130 lb (59 kg)   BMI 23.03 kg/m    Physical Exam reviewed  ASSESSMENT & PLAN:   Diagnoses and all orders for this visit: COVID -     POC COVID-19 -      molnupiravir EUA 200 mg CAPS; Take 4 capsules (800 mg total) by mouth 2 (two) times daily for 5 days. Positive Covid, treat with antiviral  Strep throat -     POCT rapid strep A -     amoxicillin (AMOXIL) 875 MG tablet; Take 1 tablet (875 mg total) by mouth 2 (two) times daily for 10 days. Positive strep , treat with amoxicillin        COVID-19 Education: The signs and symptoms of COVID-19 were discussed with the patient and how to seek care  for testing (follow up with PCP or arrange E-visit). The importance of social distancing was discussed today.   I spent 26mnutes dedicated to the care of this patient on the date of this encounter to include face-to-face time with the patient, as well as:   Follow Up:  In Person prn  Signed, LReinaldo Meeker MD  03/05/2021 10:36 AM    [place]

## 2021-03-12 NOTE — Progress Notes (Signed)
Remote pacemaker transmission.   

## 2021-03-27 DIAGNOSIS — E875 Hyperkalemia: Secondary | ICD-10-CM | POA: Diagnosis not present

## 2021-03-27 DIAGNOSIS — I480 Paroxysmal atrial fibrillation: Secondary | ICD-10-CM | POA: Diagnosis not present

## 2021-03-27 DIAGNOSIS — N184 Chronic kidney disease, stage 4 (severe): Secondary | ICD-10-CM | POA: Diagnosis not present

## 2021-03-27 DIAGNOSIS — I129 Hypertensive chronic kidney disease with stage 1 through stage 4 chronic kidney disease, or unspecified chronic kidney disease: Secondary | ICD-10-CM | POA: Diagnosis not present

## 2021-03-27 DIAGNOSIS — I5022 Chronic systolic (congestive) heart failure: Secondary | ICD-10-CM | POA: Diagnosis not present

## 2021-03-27 DIAGNOSIS — N3020 Other chronic cystitis without hematuria: Secondary | ICD-10-CM | POA: Diagnosis not present

## 2021-03-31 ENCOUNTER — Other Ambulatory Visit: Payer: Self-pay | Admitting: Internal Medicine

## 2021-03-31 NOTE — Telephone Encounter (Signed)
Prescription refill request for Xarelto received.  Indication: afib  Last office visit: Lovena Le 01/28/2021 Weight: 59 kg  Age: 87 yo  Scr: 2.05, 11/06/2020 CrCl: 17 ml/min

## 2021-04-16 ENCOUNTER — Encounter (HOSPITAL_COMMUNITY): Payer: Self-pay | Admitting: Emergency Medicine

## 2021-04-16 ENCOUNTER — Other Ambulatory Visit: Payer: Self-pay

## 2021-04-16 ENCOUNTER — Emergency Department (HOSPITAL_COMMUNITY): Payer: Medicare PPO

## 2021-04-16 ENCOUNTER — Inpatient Hospital Stay (HOSPITAL_COMMUNITY)
Admission: EM | Admit: 2021-04-16 | Discharge: 2021-04-19 | DRG: 872 | Disposition: A | Discharge status: Home / Self Care | Payer: Medicare PPO | Attending: Internal Medicine | Admitting: Internal Medicine

## 2021-04-16 DIAGNOSIS — I495 Sick sinus syndrome: Secondary | ICD-10-CM | POA: Diagnosis present

## 2021-04-16 DIAGNOSIS — Z66 Do not resuscitate: Secondary | ICD-10-CM | POA: Diagnosis present

## 2021-04-16 DIAGNOSIS — R0602 Shortness of breath: Secondary | ICD-10-CM | POA: Diagnosis not present

## 2021-04-16 DIAGNOSIS — Z20822 Contact with and (suspected) exposure to covid-19: Secondary | ICD-10-CM | POA: Diagnosis present

## 2021-04-16 DIAGNOSIS — R0902 Hypoxemia: Secondary | ICD-10-CM

## 2021-04-16 DIAGNOSIS — I959 Hypotension, unspecified: Secondary | ICD-10-CM | POA: Diagnosis not present

## 2021-04-16 DIAGNOSIS — K529 Noninfective gastroenteritis and colitis, unspecified: Secondary | ICD-10-CM | POA: Diagnosis present

## 2021-04-16 DIAGNOSIS — I340 Nonrheumatic mitral (valve) insufficiency: Secondary | ICD-10-CM | POA: Diagnosis present

## 2021-04-16 DIAGNOSIS — N3000 Acute cystitis without hematuria: Secondary | ICD-10-CM

## 2021-04-16 DIAGNOSIS — N3289 Other specified disorders of bladder: Secondary | ICD-10-CM | POA: Diagnosis not present

## 2021-04-16 DIAGNOSIS — M810 Age-related osteoporosis without current pathological fracture: Secondary | ICD-10-CM | POA: Diagnosis present

## 2021-04-16 DIAGNOSIS — E861 Hypovolemia: Secondary | ICD-10-CM | POA: Diagnosis present

## 2021-04-16 DIAGNOSIS — Z23 Encounter for immunization: Secondary | ICD-10-CM | POA: Diagnosis present

## 2021-04-16 DIAGNOSIS — Z9049 Acquired absence of other specified parts of digestive tract: Secondary | ICD-10-CM

## 2021-04-16 DIAGNOSIS — Z8673 Personal history of transient ischemic attack (TIA), and cerebral infarction without residual deficits: Secondary | ICD-10-CM

## 2021-04-16 DIAGNOSIS — Z79899 Other long term (current) drug therapy: Secondary | ICD-10-CM | POA: Diagnosis not present

## 2021-04-16 DIAGNOSIS — J9811 Atelectasis: Secondary | ICD-10-CM | POA: Diagnosis not present

## 2021-04-16 DIAGNOSIS — N390 Urinary tract infection, site not specified: Secondary | ICD-10-CM | POA: Diagnosis present

## 2021-04-16 DIAGNOSIS — Z7901 Long term (current) use of anticoagulants: Secondary | ICD-10-CM

## 2021-04-16 DIAGNOSIS — G4700 Insomnia, unspecified: Secondary | ICD-10-CM | POA: Diagnosis present

## 2021-04-16 DIAGNOSIS — R531 Weakness: Secondary | ICD-10-CM | POA: Diagnosis not present

## 2021-04-16 DIAGNOSIS — A419 Sepsis, unspecified organism: Principal | ICD-10-CM | POA: Diagnosis present

## 2021-04-16 DIAGNOSIS — I482 Chronic atrial fibrillation, unspecified: Secondary | ICD-10-CM | POA: Diagnosis present

## 2021-04-16 DIAGNOSIS — I700 Atherosclerosis of aorta: Secondary | ICD-10-CM | POA: Diagnosis present

## 2021-04-16 DIAGNOSIS — R6520 Severe sepsis without septic shock: Secondary | ICD-10-CM | POA: Diagnosis not present

## 2021-04-16 DIAGNOSIS — Z950 Presence of cardiac pacemaker: Secondary | ICD-10-CM | POA: Diagnosis not present

## 2021-04-16 DIAGNOSIS — I5022 Chronic systolic (congestive) heart failure: Secondary | ICD-10-CM | POA: Diagnosis present

## 2021-04-16 DIAGNOSIS — Z8616 Personal history of COVID-19: Secondary | ICD-10-CM | POA: Diagnosis not present

## 2021-04-16 DIAGNOSIS — I4891 Unspecified atrial fibrillation: Secondary | ICD-10-CM | POA: Diagnosis not present

## 2021-04-16 DIAGNOSIS — Z87440 Personal history of urinary (tract) infections: Secondary | ICD-10-CM

## 2021-04-16 DIAGNOSIS — R509 Fever, unspecified: Secondary | ICD-10-CM | POA: Diagnosis not present

## 2021-04-16 DIAGNOSIS — Z87891 Personal history of nicotine dependence: Secondary | ICD-10-CM | POA: Diagnosis not present

## 2021-04-16 DIAGNOSIS — R6883 Chills (without fever): Secondary | ICD-10-CM | POA: Diagnosis not present

## 2021-04-16 DIAGNOSIS — N1330 Unspecified hydronephrosis: Secondary | ICD-10-CM | POA: Diagnosis not present

## 2021-04-16 DIAGNOSIS — N184 Chronic kidney disease, stage 4 (severe): Secondary | ICD-10-CM | POA: Diagnosis present

## 2021-04-16 DIAGNOSIS — K5792 Diverticulitis of intestine, part unspecified, without perforation or abscess without bleeding: Secondary | ICD-10-CM

## 2021-04-16 DIAGNOSIS — N136 Pyonephrosis: Secondary | ICD-10-CM | POA: Diagnosis present

## 2021-04-16 LAB — URINALYSIS, ROUTINE W REFLEX MICROSCOPIC
Bilirubin Urine: NEGATIVE
Glucose, UA: NEGATIVE mg/dL
Ketones, ur: NEGATIVE mg/dL
Nitrite: POSITIVE — AB
Protein, ur: 100 mg/dL — AB
Specific Gravity, Urine: 1.011 (ref 1.005–1.030)
WBC, UA: >50 WBC/hpf — ABNORMAL HIGH (ref 0–5)
pH: 5 (ref 5.0–8.0)

## 2021-04-16 LAB — BRAIN NATRIURETIC PEPTIDE: B Natriuretic Peptide: 1125.7 pg/mL — ABNORMAL HIGH (ref 0.0–100.0)

## 2021-04-16 LAB — CBC
HCT: 35.3 % — ABNORMAL LOW (ref 36.0–46.0)
Hemoglobin: 11.3 g/dL — ABNORMAL LOW (ref 12.0–15.0)
MCH: 30.4 pg (ref 26.0–34.0)
MCHC: 32 g/dL (ref 30.0–36.0)
MCV: 94.9 fL (ref 80.0–100.0)
Platelets: 181 10*3/uL (ref 150–400)
RBC: 3.72 MIL/uL — ABNORMAL LOW (ref 3.87–5.11)
RDW: 14.7 % (ref 11.5–15.5)
WBC: 15.3 10*3/uL — ABNORMAL HIGH (ref 4.0–10.5)
nRBC: 0 % (ref 0.0–0.2)

## 2021-04-16 LAB — BASIC METABOLIC PANEL
Anion gap: 8 (ref 5–15)
BUN: 18 mg/dL (ref 8–23)
CO2: 24 mmol/L (ref 22–32)
Calcium: 9.1 mg/dL (ref 8.9–10.3)
Chloride: 104 mmol/L (ref 98–111)
Creatinine, Ser: 1.5 mg/dL — ABNORMAL HIGH (ref 0.44–1.00)
GFR, Estimated: 33 mL/min — ABNORMAL LOW (ref >60)
Glucose, Bld: 119 mg/dL — ABNORMAL HIGH (ref 70–99)
Potassium: 4.1 mmol/L (ref 3.5–5.1)
Sodium: 136 mmol/L (ref 135–145)

## 2021-04-16 LAB — RESP PANEL BY RT-PCR (FLU A&B, COVID) ARPGX2
Influenza A by PCR: NEGATIVE
Influenza B by PCR: NEGATIVE
SARS Coronavirus 2 by RT PCR: NEGATIVE

## 2021-04-16 LAB — LACTIC ACID, PLASMA
Lactic Acid, Venous: 2.7 mmol/L (ref 0.5–1.9)
Lactic Acid, Venous: 1.1 mmol/L (ref 0.5–1.9)

## 2021-04-16 LAB — DIGOXIN LEVEL: Digoxin Level: 0.5 ng/mL — ABNORMAL LOW (ref 0.8–2.0)

## 2021-04-16 MED ORDER — RIVAROXABAN 15 MG PO TABS
15.0000 mg | ORAL_TABLET | Freq: Every day | ORAL | Status: DC
  Administered 2021-04-16 – 2021-04-19 (×4): 15 mg via ORAL
  Filled 2021-04-16 (×4): qty 1

## 2021-04-16 MED ORDER — ACETAMINOPHEN 325 MG PO TABS
650.0000 mg | ORAL_TABLET | Freq: Four times a day (QID) | ORAL | Status: DC | PRN
  Administered 2021-04-17 (×2): 650 mg via ORAL
  Filled 2021-04-16 (×2): qty 2

## 2021-04-16 MED ORDER — DIGOXIN 125 MCG PO TABS: 0.0625 mg | ORAL_TABLET | Freq: Every day | ORAL | Status: DC

## 2021-04-16 MED ORDER — LACTATED RINGERS IV BOLUS (SEPSIS): 1000.0000 mL | Freq: Once | INTRAVENOUS | Status: DC

## 2021-04-16 MED ORDER — SODIUM CHLORIDE 0.9 % IV SOLN: INTRAVENOUS | Status: DC

## 2021-04-16 MED ORDER — METRONIDAZOLE 500 MG/100ML IV SOLN
500.0000 mg | Freq: Three times a day (TID) | INTRAVENOUS | Status: DC
  Administered 2021-04-17 – 2021-04-19 (×8): 500 mg via INTRAVENOUS
  Filled 2021-04-16 (×8): qty 100

## 2021-04-16 MED ORDER — LACTATED RINGERS IV BOLUS (SEPSIS): 500.0000 mL | Freq: Once | INTRAVENOUS | Status: DC

## 2021-04-16 MED ORDER — SODIUM CHLORIDE 0.9 % IV SOLN
2.0000 g | INTRAVENOUS | Status: DC
  Administered 2021-04-17 – 2021-04-18 (×2): 2 g via INTRAVENOUS
  Filled 2021-04-16 (×2): qty 20

## 2021-04-16 MED ORDER — AMIODARONE HCL 200 MG PO TABS: 200.0000 mg | ORAL_TABLET | ORAL | Status: DC

## 2021-04-16 MED ORDER — SODIUM CHLORIDE 0.9 % IV BOLUS
500.0000 mL | Freq: Once | INTRAVENOUS | Status: AC
  Administered 2021-04-16: 500 mL via INTRAVENOUS

## 2021-04-16 MED ORDER — SODIUM CHLORIDE 0.9 % IV SOLN
500.0000 mg | Freq: Once | INTRAVENOUS | Status: AC
  Administered 2021-04-16: 500 mg via INTRAVENOUS
  Filled 2021-04-16: qty 500

## 2021-04-16 MED ORDER — SODIUM CHLORIDE 0.9 % IV SOLN
1.0000 g | Freq: Once | INTRAVENOUS | Status: AC
  Administered 2021-04-16: 1 g via INTRAVENOUS
  Filled 2021-04-16: qty 10

## 2021-04-16 MED ORDER — DIGOXIN 125 MCG PO TABS
0.0625 mg | ORAL_TABLET | ORAL | Status: DC
  Administered 2021-04-16 – 2021-04-18 (×2): 0.0625 mg via ORAL
  Filled 2021-04-16 (×3): qty 1

## 2021-04-16 MED ORDER — ACETAMINOPHEN 650 MG RE SUPP: 650.0000 mg | Freq: Four times a day (QID) | RECTAL | Status: DC | PRN

## 2021-04-16 MED ORDER — INFLUENZA VAC A&B SA ADJ QUAD 0.5 ML IM PRSY
0.5000 mL | PREFILLED_SYRINGE | INTRAMUSCULAR | Status: AC
  Administered 2021-04-19: 0.5 mL via INTRAMUSCULAR
  Filled 2021-04-16 (×2): qty 0.5

## 2021-04-16 MED ORDER — ACETAMINOPHEN 325 MG PO TABS
650.0000 mg | ORAL_TABLET | Freq: Once | ORAL | Status: AC
  Administered 2021-04-16: 650 mg via ORAL
  Filled 2021-04-16: qty 2

## 2021-04-16 MED ORDER — ACETAMINOPHEN 325 MG PO TABS
650.0000 mg | ORAL_TABLET | Freq: Once | ORAL | Status: AC | PRN
  Administered 2021-04-16: 650 mg via ORAL

## 2021-04-16 MED ORDER — METRONIDAZOLE 500 MG/100ML IV SOLN
500.0000 mg | Freq: Once | INTRAVENOUS | Status: AC
  Administered 2021-04-16: 500 mg via INTRAVENOUS
  Filled 2021-04-16: qty 100

## 2021-04-16 MED ORDER — LACTATED RINGERS IV SOLN: INTRAVENOUS | Status: DC

## 2021-04-16 MED ORDER — LACTATED RINGERS IV BOLUS (SEPSIS): 250.0000 mL | Freq: Once | INTRAVENOUS | Status: DC

## 2021-04-16 MED ORDER — AMIODARONE HCL 200 MG PO TABS
200.0000 mg | ORAL_TABLET | ORAL | Status: DC
  Administered 2021-04-16 – 2021-04-19 (×4): 200 mg via ORAL
  Filled 2021-04-16 (×4): qty 1

## 2021-04-16 NOTE — ED Triage Notes (Signed)
Pt arrives via EMS from home with weakness, fever and chills for 3 days. Started feeling SOB yesterday. Endorses right sided kidney pain with movement. Last took tylenol yesterday but worried about her kidneys.

## 2021-04-16 NOTE — H&P (Signed)
History and Physical    Tracy Vasquez [id number] DOB: [birth year] DOA: 04/16/2021  PCP: Lillard Anes, MD Consultants:  cardiology: Dr. Lovena Le, urology: Dr. Lovena Neighbours Patient coming from:  [facility]. Apartments for senior citizens. Lives alone.   Chief Complaint: fever and back pain   HPI: Tracy Vasquez is a 87 y.o. female with medical history significant of chronic atrial fib on xarelto,   She states on Saturday evening she started to have a fever and chills. Fever at it's highest was 101.5. She started to take tylenol. She then continued to have fever Sunday, Monday, Tuesday. She then started to get pain in her "right kidney" that started a couple of days ago and she thought it was a muscle. Pain rated as a 7-8/10 and described as an ache. No radiation. She also had intermittent episodes of shortness of breath that also started a few days ago. She had to sit up in bed and catch her breath. Today the shortness of breath was a bit more. She has not missed any doses of her xarelto. She also "adjusts" her cardiac medication on her own and has decided to take her digoxin every other day and 1/2 of her amiodorone at times.   She denies any headaches, chest pain, palpitations, coughing, N/V/D, stomach pain, no dysuria, urgency or frequency. She denies any leg swelling or weight gain.   She did complete a course of keflex 3 weeks ago for a UTI.   Last BM was last night and it was normal. Denies any loose stools, blood in stool. Last colonoscopy at least 20 years ago and has never been told she had diverticulitis.    ED Course: Vitals: Temperature 100.6, blood pressure 104/70, heart rate 77, respiratory rate 16, oxygen 91% on room air. Pertinent labs: WBC 15.3, hemoglobin 11.3, creatinine: 1.50, UA with moderate blood, 100 protein, nitrite positive and large leuk, greater than 50 WBCs and many bacteria, blood cultures collected, lactic acid pending.  Chest x-ray: Opacity at the  bases could be atelectasis, scarring, or bronchopneumonia.  7 mm nodule at the right lung base recommend follow-up after treatment. CT abdomen pelvis: Moderate to marked severity sigmoid colitis and/or diverticulitis.  Small bilateral pleural effusions, left greater than right.  Mild bilateral hydronephrosis without evidence of renal calculi.  Small amount of air within the lumen of a moderately distended urinary bladder. Code sepsis activated in ED. patient given Tylenol and NS bolus of 500 with rate of 150cc/hour. Bp dropped and '30mg'$ /kg Lr were ordered; however, Bp was 123/80 in room and I held off on this with EF of 30%.  tRH asked to admit.   Review of Systems: As per HPI; otherwise review of systems reviewed and negative.   Ambulatory Status:  Ambulates without assistance   Past Medical History:  Diagnosis Date   Acute gangrenous appendicitis with perforation and peritonitis 11/22/2011   Afib ([place]) 2008   CVA (cerebral infarction) 2008   no residual deficits   Osteoporosis     Past Surgical History:  Procedure Laterality Date   INSERT / REPLACE / REMOVE PACEMAKER     LAPAROSCOPIC APPENDECTOMY  11/22/2011   Procedure: APPENDECTOMY LAPAROSCOPIC;  Surgeon: Stark Klein, MD;  Location: [facility];  Service: General;  Laterality: N/A;   PACEMAKER INSERTION     PPM GENERATOR CHANGEOUT N/A 08/16/2017   Procedure: PPM GENERATOR CHANGEOUT;  Surgeon: Evans Lance, MD;  Location: [facility];  Service: Cardiovascular;  Laterality: N/A;   RIGHT/LEFT HEART  CATH AND CORONARY ANGIOGRAPHY N/A 02/01/2017   Procedure: Right/Left Heart Cath and Coronary Angiography;  Surgeon: Troy Sine, MD;  Location: [facility];  Service: Cardiovascular;  Laterality: N/A;   TONSILLECTOMY AND ADENOIDECTOMY  1935    Social History   Socioeconomic History   Marital status: Widowed    Spouse name: Not on file   Number of children: 3   Years of education: Not on file   Highest education level: Not  on file  Occupational History   Occupation: Retired  Tobacco Use   Smoking status: Former    Types: Cigarettes    Quit date: 1975    Years since quitting: 47.8   Smokeless tobacco: Never  Vaping Use   Vaping Use: Never used  Substance and Sexual Activity   Alcohol use: No   Drug use: No   Sexual activity: Not Currently  Other Topics Concern   Not on file  Social History Narrative   Lives with dtr, moved to [place] from [place] in 2014, widowed early 2013 (55 years married)   Social Determinants of Radio broadcast assistant Strain: Not on file  Food Insecurity: Not on file  Transportation Needs: Not on file  Physical Activity: Not on file  Stress: Not on file  Social Connections: Not on file  Intimate Partner Violence: Not on file    No Known Allergies  Family History  Problem Relation Age of Onset   Coronary artery disease Mother    Heart attack Mother    Atrial fibrillation Father    Lung cancer Father    Alcohol abuse Brother    Failure to thrive Brother     Prior to Admission medications   Medication Sig Start Date End Date Taking? Authorizing Provider  amiodarone (PACERONE) 200 MG tablet TAKE 1 TABLET ON MONDAY, TUESDAY, WEDNESDAY, THURSDAY, FRIDAY, AND SATURDAY. DO NOT TAKE ANY ON SUNDAY. 02/18/21   Evans Lance, MD  digoxin (LANOXIN) 0.125 MG tablet Take 0.5 tablets (0.0625 mg total) by mouth daily. Please keep upcoming appt in July 2022 with Dr. Lovena Le before anymore refills. Thank you 11/26/20   Evans Lance, MD  LORazepam (ATIVAN) 0.5 MG tablet Take 1 tablet (0.5 mg total) by mouth 3 times/day as needed-between meals & bedtime for sleep. 11/19/20   Lillard Anes, MD  metoprolol tartrate (LOPRESSOR) 100 MG tablet Take 1 tablet (100 mg total) by mouth 2 (two) times daily. Please make overdue appt with Dr. Lovena Le before anymore refills. Thank you 2nd attempt 10/17/20   Evans Lance, MD  Rivaroxaban Alveda Reasons) 15 MG TABS tablet TAKE 1 TABLET BY  MOUTH DAILY WITH SUPPER 03/31/21   Evans Lance, MD  sodium zirconium cyclosilicate (LOKELMA) 10 g PACK packet Take 10 g by mouth 2 (two) times a week.    [provider]    Physical Exam: Vitals:   04/16/21 1303 04/16/21 1315 04/16/21 1400 04/16/21 1539  BP: 129/73 126/72 (!) 142/73   Pulse: 87 (!) 41 87   Resp: (!) '23 18 16   '$ Temp:    (!) 101.3 F (38.5 C)  TempSrc:    Rectal  SpO2: 96% 92% 93%   Weight:      Height:         General:  Appears calm and comfortable and is in NAD Eyes:  PERRL, EOMI, normal lids, iris ENT:  grossly normal hearing, lips & tongue, dry lips; appropriate dentition Neck:  no LAD, masses  or thyromegaly; no carotid bruits Cardiovascular:  irregulary, irregular, no m/r/g. No LE edema.  Respiratory:   crackles in RLL. Good air movement.  Normal respiratory effort. Abdomen:  soft, NT, ND, NABS Back:   normal alignment, no CVAT Skin:  no rash or induration seen on limited exam Musculoskeletal:  grossly normal tone BUE/BLE, good ROM, no bony abnormality Lower extremity:  No LE edema.  Limited foot exam with no ulcerations.  2+ distal pulses. Psychiatric:  grossly normal mood and affect, speech fluent and appropriate, AOx3 Neurologic:  CN 2-12 grossly intact, moves all extremities in coordinated fashion, sensation intact    Radiological Exams on Admission: Independently reviewed - see discussion in A/P where applicable  CT ABDOMEN PELVIS WO CONTRAST  Result Date: 04/16/2021 CLINICAL DATA:  Weakness, fever and chills. EXAM: CT ABDOMEN AND PELVIS WITHOUT CONTRAST TECHNIQUE: Multidetector CT imaging of the abdomen and pelvis was performed following the standard protocol without IV contrast. COMPARISON:  Nov 21, 2011 FINDINGS: Lower chest: Mild to moderate severity areas of scarring and/or atelectasis are seen within the bilateral lung bases. Small bilateral pleural effusions are seen, left greater than right. Hepatobiliary: No focal liver  abnormality is seen. No gallstones, gallbladder wall thickening, or biliary dilatation. Pancreas: Unremarkable. No pancreatic ductal dilatation or surrounding inflammatory changes. Spleen: Normal in size without focal abnormality. Adrenals/Urinary Tract: Adrenal glands are unremarkable. Kidneys are normal in size, without focal lesions. There is mild bilateral hydronephrosis without evidence of renal calculi. A mild amount of air is seen within the lumen of a moderately distended urinary bladder. Stomach/Bowel: Stomach is within normal limits. The appendix is not identified. No evidence of bowel dilatation. Diverticula are seen within descending and sigmoid colon. Moderate to marked severity thickening of the proximal to mid sigmoid colon is also seen. Vascular/Lymphatic: Aortic atherosclerosis. No enlarged abdominal or pelvic lymph nodes. Reproductive: Uterus and bilateral adnexa are unremarkable. Other: No abdominal wall hernia or abnormality. No abdominopelvic ascites. Musculoskeletal: Multilevel degenerative changes are seen throughout the lumbar spine. IMPRESSION: 1. Moderate to marked severity sigmoid colitis and/or diverticulitis. 2. Small bilateral pleural effusions, left greater than right. 3. Mild bilateral hydronephrosis without evidence of renal calculi. 4. Small amount of air within the lumen of a moderately distended urinary bladder. Correlation with recent instrumentation is recommended. Aortic Atherosclerosis (ICD10-I70.0). Electronically Signed   By: Virgina Norfolk M.D.   On: 04/16/2021 15:19   DG Chest 2 View  Result Date: 04/16/2021 CLINICAL DATA:  Shortness of breath and fever for 3 days EXAM: CHEST - 2 VIEW COMPARISON:  01/27/2017 FINDINGS: Streaky, indistinct density at the lung bases. A similar appearance was seen on prior but not on abdominal CT 11/21/2011. Possible 7 mm nodule in the right lower lung. Mildly enlarged heart size. Dual-chamber pacer leads from the left in unremarkable  position. IMPRESSION: 1. Opacity at the bases that could be atelectasis, scarring, or bronchopneumonia. 2. Equivocal 7 mm nodule at the right lung base. Recommend follow-up after treatment to determine persistence. Electronically Signed   By: Jorje Guild M.D.   On: 04/16/2021 09:32    EKG: Independently reviewed.  Atrial fibrillation with rate 99, LBBB; nonspecific ST changes with no evidence of acute ischemia   Labs on Admission: I have personally reviewed the available labs and imaging studies at the time of the admission.  Pertinent labs:  WBC 15.3 hemoglobin 11.3,  Creatinine: 1.50 (1.71-2.05) Lactic acid: 2.7--->1.1  Assessment/Plan Active Problems:   Sepsis secondary to UTI/pyelo vs. Colitis  87 year old  female presenting with sepsis (leukocytosis 15.3, tachycardia, temperature) secondary to UTI versus colitis. -Sepsis code activated and patient originally written to be bolused 30 mg/kg however blood pressure was never below [id number] systolic. With hx of systolic CHF with EF of [id number], bolused another 500cc and decreased rate down to 75cc/hour. Close monitoring of pressures  -Continue with IV fluids and monitor pressures -Continue antibiotic coverage for colitis with Rocephin and Flagyl.  She interestingly has had no bowel movement changes or stomach pain. -Continue Rocephin for UTI coverage and possible pyelo  -Received azithromycin in ED for possible pneumonia however suspect more likely due to effusions versus atelectasis.  Tailor this as indicated -Pancultured -Tylenol as needed for antipyretic    Chronic atrial fibrillation ([place]) -Has been decently rate controlled.  will start back home meds of amiodarone and digoxin and continue Xarelto -self Adjusting her medication and decided to take her digoxin every other day. digoxin level pending -Unclear on how she is taking her amiodarone as she told pharmacy tech she takes 100 mg but told me she takes it as prescribed. Continue '200mg'$   dosing  -Placed on telemetry -Hold metoprolol in light of softer blood pressure readings however she states this is where she typically runs.  Air in bladder/mild bilateral hydronephrosis with no calculi  -she has no complaints of suprapubic pain, but does have some difficulty urinating. Had CVA tenderness that has resolved. ? Pyelo as well -continue rocephin  -check bladder scan -she has had no urological procedures recently -concern for small fistula in setting of diverticulitis/colitis  -creatinine stable, urology consult tomorrow if needed vs. Outpatient f/u. [facility]     Chronic kidney disease, stage 4 (severe) (HCC) -Below baseline -Avoid nephrotoxic drugs -Monitor intake and output and follow BMP daily    Chronic systolic CHF (congestive heart failure) (HCC) -Does not appear volume overloaded and actually appears more dry on exam -Follow intake and output and daily weights -Received a total of 1 L bolus in ED and will continue IV fluids at 75 cc an hour x12 hours -Last echo in 2019 showed an EF of 30 to 35%.  Systolic function moderately to severely reduced.  Akinesis of the mid 80: Anterior septal and apical myocardium.  Moderate to severe mitral regurgitation    Tachycardia-bradycardia syndrome s/p pacemaker  Stable Recent pacemaker interrogation in August 2022.  Body mass index is 21.97 kg/m.   Level of care: Telemetry Medical DVT prophylaxis: xarelto   Code Status:  DNR Family Communication: None present. Called her daughter Okima Penaloza: [id number]  Disposition Plan:  The patient is from: senior residence  Anticipated d/c is to: senior residence Requires inpatient hospitalization and is at significant risk of worsening, requires constant monitoring, IV medication and IVF.  Patient is currently: acutely ill Consults called: none   Admission status:  observation   Dragon dictation used in completing this note.    Orma Flaming MD [facility]   How to contact the [facility] Attending or Consulting provider [place] or covering provider during after hours [place], for this patient?  Check the care team in [facility] and look for a) attending/consulting [facility] provider listed and b) the [facility] team listed Log into [email or web address] and use [place]'s universal password to access. If you do not have the password, please contact the hospital operator. Locate the [facility] provider you are looking for under [facility] and page to a number that you can be directly reached. If you still have difficulty  reaching the provider, please page the [facility] (Director on Call) for the Hospitalists listed on amion for assistance.   04/16/2021, 4:46 PM

## 2021-04-16 NOTE — Sepsis Progress Note (Signed)
[facility] following Code Sepsis. Abx were given before Code Sepsis initiated

## 2021-04-16 NOTE — ED Provider Notes (Signed)
[facility] Provider Note   CSN: [id number] Arrival date & time: 04/16/21  [id number]     History No chief complaint on file.   Tracy Vasquez is a 87 y.o. female.  Pt presents to the ED today with fever for 3 days.  Pt started feeling sob yesterday and had some right kidney pain yesterday.  Pt had some sob as well.  Pt took tylenol last night.  She was given tylenol in triage.  Pt has been able to eat and drink.      Past Medical History:  Diagnosis Date   Acute gangrenous appendicitis with perforation and peritonitis 11/22/2011   Afib ([place]) 2008   CVA (cerebral infarction) 2008   no residual deficits   Osteoporosis     Patient Active Problem List   Diagnosis Date Noted   COVID-19 03/05/2021   Strep throat 03/05/2021   Tachycardia-bradycardia syndrome ([place]) 01/28/2021   Recurrent UTI [id number]   Chronic systolic CHF (congestive heart failure) ([place]) 01/27/2017   Current use of long term anticoagulation 01/27/2017   Aortic atherosclerosis ([place]) 01/27/2017   Chronic kidney disease, stage 4 (severe) ([place]) 01/06/2017   Insomnia 01/04/2017   Bilateral lower extremity edema 12/31/2016   Varicose veins of both lower extremities 12/31/2016   Age-related osteoporosis without current pathological fracture 01/06/2016   Hemispheric carotid artery syndrome 01/06/2016   Osteoporosis    Pacemaker 08/16/2012   Chronic atrial fibrillation ([place]) 11/22/2011    Past Surgical History:  Procedure Laterality Date   INSERT / REPLACE / REMOVE PACEMAKER     LAPAROSCOPIC APPENDECTOMY  11/22/2011   Procedure: APPENDECTOMY LAPAROSCOPIC;  Surgeon: Stark Klein, MD;  Location: [facility];  Service: General;  Laterality: N/A;   PACEMAKER INSERTION     PPM GENERATOR CHANGEOUT N/A 08/16/2017   Procedure: PPM GENERATOR CHANGEOUT;  Surgeon: Evans Lance, MD;  Location: [facility];  Service: Cardiovascular;  Laterality: N/A;   RIGHT/LEFT HEART CATH AND CORONARY  ANGIOGRAPHY N/A 02/01/2017   Procedure: Right/Left Heart Cath and Coronary Angiography;  Surgeon: Troy Sine, MD;  Location: [facility];  Service: Cardiovascular;  Laterality: N/A;   TONSILLECTOMY AND ADENOIDECTOMY  1935     OB History   No obstetric history on file.     Family History  Problem Relation Age of Onset   Coronary artery disease Mother    Heart attack Mother    Atrial fibrillation Father    Lung cancer Father    Alcohol abuse Brother    Failure to thrive Brother     Social History   Tobacco Use   Smoking status: Former    Types: Cigarettes    Quit date: 1975    Years since quitting: 47.8   Smokeless tobacco: Never  Vaping Use   Vaping Use: Never used  Substance Use Topics   Alcohol use: No   Drug use: No    Home Medications Prior to Admission medications   Medication Sig Start Date End Date Taking? Authorizing Provider  amiodarone (PACERONE) 200 MG tablet TAKE 1 TABLET ON MONDAY, TUESDAY, WEDNESDAY, THURSDAY, FRIDAY, AND SATURDAY. DO NOT TAKE ANY ON SUNDAY. 02/18/21   Evans Lance, MD  digoxin (LANOXIN) 0.125 MG tablet Take 0.5 tablets (0.0625 mg total) by mouth daily. Please keep upcoming appt in July 2022 with Dr. Lovena Le before anymore refills. Thank you 11/26/20   Evans Lance, MD  LORazepam (ATIVAN) 0.5 MG tablet Take 1 tablet (0.5 mg  total) by mouth 3 times/day as needed-between meals & bedtime for sleep. 11/19/20   Lillard Anes, MD  metoprolol tartrate (LOPRESSOR) 100 MG tablet Take 1 tablet (100 mg total) by mouth 2 (two) times daily. Please make overdue appt with Dr. Lovena Le before anymore refills. Thank you 2nd attempt 10/17/20   Evans Lance, MD  Rivaroxaban Alveda Reasons) 15 MG TABS tablet TAKE 1 TABLET BY MOUTH DAILY WITH SUPPER 03/31/21   Evans Lance, MD  sodium zirconium cyclosilicate (LOKELMA) 10 g PACK packet Take 10 g by mouth 2 (two) times a week.    [provider]    Allergies    Patient has no known  allergies.  Review of Systems   Review of Systems  Constitutional:  Positive for fever.  Respiratory:  Positive for shortness of breath.   Genitourinary:  Positive for dysuria.  All other systems reviewed and are negative.  Physical Exam Updated Vital Signs BP (!) 142/73   Pulse 87   Temp (!) 101.3 F (38.5 C) (Rectal)   Resp 16   Ht '5\' 3"'$  (1.6 m)   Wt 56.2 kg   SpO2 93%   BMI 21.97 kg/m   Physical Exam Vitals and nursing note reviewed.  Constitutional:      Appearance: Normal appearance.  HENT:     Head: Normocephalic and atraumatic.     Right Ear: External ear normal.     Left Ear: External ear normal.     Nose: Nose normal.     Mouth/Throat:     Mouth: Mucous membranes are moist.     Pharynx: Oropharynx is clear.  Eyes:     Extraocular Movements: Extraocular movements intact.     Conjunctiva/sclera: Conjunctivae normal.     Pupils: Pupils are equal, round, and reactive to light.  Cardiovascular:     Rate and Rhythm: Regular rhythm. Tachycardia present.     Pulses: Normal pulses.     Heart sounds: Normal heart sounds.  Pulmonary:     Effort: Pulmonary effort is normal.     Breath sounds: Normal breath sounds.  Abdominal:     General: Abdomen is flat. Bowel sounds are normal.     Palpations: Abdomen is soft.  Musculoskeletal:        General: Normal range of motion.     Cervical back: Normal range of motion and neck supple.  Skin:    General: Skin is warm.     Capillary Refill: Capillary refill takes less than 2 seconds.  Neurological:     General: No focal deficit present.     Mental Status: She is alert and oriented to person, place, and time.  Psychiatric:        Mood and Affect: Mood normal.        Behavior: Behavior normal.        Thought Content: Thought content normal.        Judgment: Judgment normal.    ED Results / Procedures / Treatments   Labs (all labs ordered are listed, but only abnormal results are displayed) Labs Reviewed  BASIC  METABOLIC PANEL - Abnormal; Notable for the following components:      Result Value   Glucose, Bld 119 (*)    Creatinine, Ser 1.50 (*)    GFR, Estimated 33 (*)    All other components within normal limits  CBC - Abnormal; Notable for the following components:   WBC 15.3 (*)    RBC 3.72 (*)  Hemoglobin 11.3 (*)    HCT 35.3 (*)    All other components within normal limits  URINALYSIS, ROUTINE W REFLEX MICROSCOPIC - Abnormal; Notable for the following components:   APPearance TURBID (*)    Hgb urine dipstick MODERATE (*)    Protein, ur 100 (*)    Nitrite POSITIVE (*)    Leukocytes,Ua LARGE (*)    WBC, UA >50 (*)    Bacteria, UA MANY (*)    Non Squamous Epithelial 0-5 (*)    All other components within normal limits  RESP PANEL BY RT-PCR (FLU A&B, COVID) ARPGX2  CULTURE, BLOOD (ROUTINE X 2)  CULTURE, BLOOD (ROUTINE X 2)  URINE CULTURE  LACTIC ACID, PLASMA  LACTIC ACID, PLASMA  MISCELLANEOUS GENETIC TEST    EKG EKG Interpretation  Date/Time:  Wednesday April 16 2021 08:46:17 EDT Ventricular Rate:  99 PR Interval:    QRS Duration: 128 QT Interval:  316 QTC Calculation: 405 R Axis:   -56 Text Interpretation: Atrial fibrillation Left axis deviation Left bundle branch block Abnormal ECG No significant change since last tracing Confirmed by Isla Pence [phone]) on 04/16/2021 12:40:47 PM  Radiology CT ABDOMEN PELVIS WO CONTRAST  Result Date: 04/16/2021 CLINICAL DATA:  Weakness, fever and chills. EXAM: CT ABDOMEN AND PELVIS WITHOUT CONTRAST TECHNIQUE: Multidetector CT imaging of the abdomen and pelvis was performed following the standard protocol without IV contrast. COMPARISON:  Nov 21, 2011 FINDINGS: Lower chest: Mild to moderate severity areas of scarring and/or atelectasis are seen within the bilateral lung bases. Small bilateral pleural effusions are seen, left greater than right. Hepatobiliary: No focal liver abnormality is seen. No gallstones, gallbladder wall  thickening, or biliary dilatation. Pancreas: Unremarkable. No pancreatic ductal dilatation or surrounding inflammatory changes. Spleen: Normal in size without focal abnormality. Adrenals/Urinary Tract: Adrenal glands are unremarkable. Kidneys are normal in size, without focal lesions. There is mild bilateral hydronephrosis without evidence of renal calculi. A mild amount of air is seen within the lumen of a moderately distended urinary bladder. Stomach/Bowel: Stomach is within normal limits. The appendix is not identified. No evidence of bowel dilatation. Diverticula are seen within descending and sigmoid colon. Moderate to marked severity thickening of the proximal to mid sigmoid colon is also seen. Vascular/Lymphatic: Aortic atherosclerosis. No enlarged abdominal or pelvic lymph nodes. Reproductive: Uterus and bilateral adnexa are unremarkable. Other: No abdominal wall hernia or abnormality. No abdominopelvic ascites. Musculoskeletal: Multilevel degenerative changes are seen throughout the lumbar spine. IMPRESSION: 1. Moderate to marked severity sigmoid colitis and/or diverticulitis. 2. Small bilateral pleural effusions, left greater than right. 3. Mild bilateral hydronephrosis without evidence of renal calculi. 4. Small amount of air within the lumen of a moderately distended urinary bladder. Correlation with recent instrumentation is recommended. Aortic Atherosclerosis (ICD10-I70.0). Electronically Signed   By: Virgina Norfolk M.D.   On: 04/16/2021 15:19   DG Chest 2 View  Result Date: 04/16/2021 CLINICAL DATA:  Shortness of breath and fever for 3 days EXAM: CHEST - 2 VIEW COMPARISON:  01/27/2017 FINDINGS: Streaky, indistinct density at the lung bases. A similar appearance was seen on prior but not on abdominal CT 11/21/2011. Possible 7 mm nodule in the right lower lung. Mildly enlarged heart size. Dual-chamber pacer leads from the left in unremarkable position. IMPRESSION: 1. Opacity at the bases that  could be atelectasis, scarring, or bronchopneumonia. 2. Equivocal 7 mm nodule at the right lung base. Recommend follow-up after treatment to determine persistence. Electronically Signed   By: Gilford Silvius.D.  On: 04/16/2021 09:32    Procedures Procedures   Medications Ordered in ED Medications  lactated ringers infusion (has no administration in time range)  lactated ringers bolus 1,000 mL (has no administration in time range)    And  lactated ringers bolus 500 mL (has no administration in time range)    And  lactated ringers bolus 250 mL (has no administration in time range)  metroNIDAZOLE (FLAGYL) IVPB 500 mg (has no administration in time range)  acetaminophen (TYLENOL) tablet 650 mg (650 mg Oral Given 04/16/21 0849)  sodium chloride 0.9 % bolus 500 mL (500 mLs Intravenous New Bag/Given 04/16/21 1306)  cefTRIAXone (ROCEPHIN) 1 g in sodium chloride 0.9 % 100 mL IVPB (0 g Intravenous Stopped 04/16/21 1407)  azithromycin (ZITHROMAX) 500 mg in sodium chloride 0.9 % 250 mL IVPB (500 mg Intravenous New Bag/Given 04/16/21 1407)  acetaminophen (TYLENOL) tablet 650 mg (650 mg Oral Given 04/16/21 1540)    ED Course  I have reviewed the triage vital signs and the nursing notes.  Pertinent labs & imaging results that were available during my care of the patient were reviewed by me and considered in my medical decision making (see chart for details).    MDM Rules/Calculators/A&P                           Possible pna on cxr, so she was given rocephin/zithromax.  Urine + for UTI which rocephin should cover.  Urine culture sent.  CT abd/pelvis to check for pyelo.  She does not have that, but does have significant colitis/diverticulitis.  Flagyl added on.  No evidence of pna on lung bases which is where the CXR showed possible pna.  Pt has spiked another fever and bp is down to the 90s.  I suspect pt is developing sepsis, so a code sepsis called.  Covid/Flu neg.  Pt d/w Dr. Rogers Blocker  (triad) for admission.  CRITICAL CARE Performed by: Isla Pence   Total critical care time: 30 minutes  Critical care time was exclusive of separately billable procedures and treating other patients.  Critical care was necessary to treat or prevent imminent or life-threatening deterioration.  Critical care was time spent personally by me on the following activities: development of treatment plan with patient and/or surrogate as well as nursing, discussions with consultants, evaluation of patient's response to treatment, examination of patient, obtaining history from patient or surrogate, ordering and performing treatments and interventions, ordering and review of laboratory studies, ordering and review of radiographic studies, pulse oximetry and re-evaluation of patient's condition.   Tracy Vasquez was evaluated in Emergency Department on 04/16/2021 for the symptoms described in the history of present illness. She was evaluated in the context of the global COVID-19 pandemic, which necessitated consideration that the patient might be at risk for infection with the SARS-CoV-2 virus that causes COVID-19. Institutional protocols and algorithms that pertain to the evaluation of patients at risk for COVID-19 are in a state of rapid change based on information released by regulatory bodies including the CDC and federal and state organizations. These policies and algorithms were followed during the patient's care in the ED.  Final Clinical Impression(s) / ED Diagnoses Final diagnoses:  Colitis  Acute cystitis without hematuria  Diverticulitis  Sepsis without acute organ dysfunction, due to unspecified organism [facility])    Rx / DC Orders ED Discharge Orders     None        Isla Pence, MD 04/16/21  1545  

## 2021-04-16 NOTE — Plan of Care (Signed)
  Problem: Urinary Elimination: Goal: Signs and symptoms of infection will decrease Outcome: Progressing   Problem: Fluid Volume: Goal: Hemodynamic stability will improve Outcome: Progressing   Problem: Clinical Measurements: Goal: Diagnostic test results will improve Outcome: Progressing Goal: Signs and symptoms of infection will decrease Outcome: Progressing   Problem: Respiratory: Goal: Ability to maintain adequate ventilation will improve Outcome: Progressing

## 2021-04-16 NOTE — ED Notes (Signed)
Pt given dinner tray.

## 2021-04-17 ENCOUNTER — Observation Stay (HOSPITAL_COMMUNITY): Payer: Medicare PPO

## 2021-04-17 DIAGNOSIS — A419 Sepsis, unspecified organism: Principal | ICD-10-CM

## 2021-04-17 DIAGNOSIS — I5022 Chronic systolic (congestive) heart failure: Secondary | ICD-10-CM

## 2021-04-17 DIAGNOSIS — N3000 Acute cystitis without hematuria: Secondary | ICD-10-CM | POA: Diagnosis not present

## 2021-04-17 DIAGNOSIS — I482 Chronic atrial fibrillation, unspecified: Secondary | ICD-10-CM

## 2021-04-17 DIAGNOSIS — N184 Chronic kidney disease, stage 4 (severe): Secondary | ICD-10-CM | POA: Diagnosis not present

## 2021-04-17 DIAGNOSIS — R0602 Shortness of breath: Secondary | ICD-10-CM | POA: Diagnosis not present

## 2021-04-17 DIAGNOSIS — R0902 Hypoxemia: Secondary | ICD-10-CM | POA: Diagnosis not present

## 2021-04-17 DIAGNOSIS — R6520 Severe sepsis without septic shock: Secondary | ICD-10-CM

## 2021-04-17 DIAGNOSIS — I495 Sick sinus syndrome: Secondary | ICD-10-CM

## 2021-04-17 DIAGNOSIS — K529 Noninfective gastroenteritis and colitis, unspecified: Secondary | ICD-10-CM | POA: Diagnosis not present

## 2021-04-17 LAB — CBC
HCT: 35 % — ABNORMAL LOW (ref 36.0–46.0)
Hemoglobin: 11.1 g/dL — ABNORMAL LOW (ref 12.0–15.0)
MCH: 30.2 pg (ref 26.0–34.0)
MCHC: 31.7 g/dL (ref 30.0–36.0)
MCV: 95.1 fL (ref 80.0–100.0)
Platelets: 167 10*3/uL (ref 150–400)
RBC: 3.68 MIL/uL — ABNORMAL LOW (ref 3.87–5.11)
RDW: 14.7 % (ref 11.5–15.5)
WBC: 13.8 10*3/uL — ABNORMAL HIGH (ref 4.0–10.5)
nRBC: 0 % (ref 0.0–0.2)

## 2021-04-17 LAB — COMPREHENSIVE METABOLIC PANEL
ALT: 15 U/L (ref 0–44)
AST: 20 U/L (ref 15–41)
Albumin: 3.1 g/dL — ABNORMAL LOW (ref 3.5–5.0)
Alkaline Phosphatase: 57 U/L (ref 38–126)
Anion gap: 9 (ref 5–15)
BUN: 19 mg/dL (ref 8–23)
CO2: 23 mmol/L (ref 22–32)
Calcium: 8.9 mg/dL (ref 8.9–10.3)
Chloride: 108 mmol/L (ref 98–111)
Creatinine, Ser: 1.57 mg/dL — ABNORMAL HIGH (ref 0.44–1.00)
GFR, Estimated: 31 mL/min — ABNORMAL LOW (ref >60)
Glucose, Bld: 113 mg/dL — ABNORMAL HIGH (ref 70–99)
Potassium: 5 mmol/L (ref 3.5–5.1)
Sodium: 140 mmol/L (ref 135–145)
Total Bilirubin: 1.4 mg/dL — ABNORMAL HIGH (ref 0.3–1.2)
Total Protein: 6.9 g/dL (ref 6.5–8.1)

## 2021-04-17 MED ORDER — FUROSEMIDE 10 MG/ML IJ SOLN
20.0000 mg | Freq: Once | INTRAMUSCULAR | Status: AC
  Administered 2021-04-17: 20 mg via INTRAVENOUS
  Filled 2021-04-17: qty 2

## 2021-04-17 MED ORDER — METOPROLOL TARTRATE 100 MG PO TABS
100.0000 mg | ORAL_TABLET | Freq: Two times a day (BID) | ORAL | Status: DC
  Administered 2021-04-17 – 2021-04-19 (×5): 100 mg via ORAL
  Filled 2021-04-17 (×5): qty 1

## 2021-04-17 NOTE — Progress Notes (Signed)
Mobility Specialist Criteria Algorithm Info.  Mobility Team: HOB elevated: Activity: Ambulated in hall; Dangled on edge of bed Range of motion: Active; All extremities Level of assistance: Standby assist, set-up cues, supervision of patient - no hands on Assistive device: Other (Comment) (IV Pole) Minutes sitting in chair:  Minutes stood:  Minutes ambulated:  Distance ambulated (ft): 225 ft Mobility response: Tolerated well Bed Position: Semi-fowlers  Patient received lying supine in bed willing to participate in mobility. Reported residing at independent living and is independent with ADL's and iADL's. Pt is independent with bed mobility and ambulated mod I with IV pole to steady. Tolerated ambulation well without complaint or incident and was left dangling EOB with all needs met.   04/17/2021 11:41 AM

## 2021-04-17 NOTE — Progress Notes (Signed)
PROGRESS NOTE    Tracy Vasquez  [id number] DOB: April 21, 1930 DOA: 04/16/2021 PCP: Lillard Anes, MD   Brief Narrative:  Tracy Vasquez is a 87 y.o. female with medical history significant of chronic atrial fib on xarelto, CKD4, HF, Tachybrady syndrome s/p pacemaker who presents with worsening fevers/chills over the past 5-6 days prior to admission noting new pain in her "right kidney" area. Admitted for sepsis secondary to UT/pyelo and questionable colitis.  Assessment & Plan:   Sepsis secondary to UTI/pyelo  Rule out colitis/diverticulitis - Leukocytosis 15.3, tachycardia, febrile with notable symptoms of UTI/diverticulitis - Continue antibiotic coverage for UTI/colitis with Rocephin and Flagyl - Follow cultures; UA markedly abnormal - Patient remains tachycardic/febrile overnight but blood pressure appears to have stabilized   Chronic atrial fibrillation (HCC) - Continue amiodarone, digoxin and xaretlo - Unclear why she is self adjusting her medication -taking digoxin every other day. Digoxin level low 0.5 - resume daily dose and follow - Continue telemetry - Metoprolol on hold at intake - restart given tachycardia and improving BP   Air in bladder/mild bilateral hydronephrosis with no calculi  - She has no complaints of suprapubic pain, but does have some difficulty urinating. Had CVA tenderness at intake that is now improving - Bladder scan pending - -concern for small fistula in setting of diverticulitis/colitis  - She has had no urological procedures recently per report  Chronic kidney disease, stage 4 (severe) (HCC) - Baseline 1.7 - currently 1.5 - Avoid nephrotoxic drugs - Monitor intake and output and follow BMP daily   Chronic systolic CHF (congestive heart failure) (HCC) -Hypovolemic at intake -Increase p.o. intake as appropriate, hold off on further IV fluids -Last echo in 2019 showed an EF of 30 to 35%.  Systolic function moderately to severely reduced.   Akinesis of the mid 80: Anterior septal and apical myocardium.  Moderate to severe mitral regurgitation   Tachycardia-bradycardia syndrome s/p pacemaker  Stable Recent pacemaker interrogation in August 2022.   Body mass index is 21.97 kg/m.  DVT prophylaxis: xarelto   Code Status:  DNR Family Communication: None present  Status is: Inpatient  Dispo: The patient is from: Independent living              Anticipated d/c is to: Same              Anticipated d/c date is: 48 to 72 hours              Patient currently not medically stable for discharge  Consultants:  None  Procedures:  None  Antimicrobials:  Ceftriaxone, metronidazole  Subjective: No acute issues or events overnight denies nausea vomiting diarrhea constipation headache fevers chills or chest pain, flank pain and urinary frequency improving.  Objective: Vitals:   04/17/21 0315 04/17/21 0338 04/17/21 0521 04/17/21 0732  BP: (!) 162/83 139/86  124/64  Pulse: (!) 121   (!) 102  Resp: 20   18  Temp: 98.5 F (36.9 C)   (!) 100.9 F (38.3 C)  TempSrc: Oral   Oral  SpO2: 90% 90%  95%  Weight:   60 kg   Height:        Intake/Output Summary (Last 24 hours) at 04/17/2021 0734 Last data filed at 04/17/2021 0522 Gross per 24 hour  Intake 1377.65 ml  Output 900 ml  Net 477.65 ml   Filed Weights   04/16/21 0842 04/16/21 1854 04/17/21 0521  Weight: 56.2 kg 59.9 kg 60 kg    Examination:  General:  Pleasantly resting in bed, No acute distress. HEENT:  Normocephalic atraumatic.  Sclerae nonicteric, noninjected.  Extraocular movements intact bilaterally. Neck:  Without mass or deformity.  Trachea is midline. Lungs:  Clear to auscultate bilaterally without rhonchi, wheeze, or rales. Heart:  Regular rate and rhythm.  Without murmurs, rubs, or gallops. Abdomen:  Soft, nontender, nondistended.  Without guarding or rebound. Extremities: Without cyanosis, clubbing, edema, or obvious deformity. Vascular:  Dorsalis  pedis and posterior tibial pulses palpable bilaterally. Skin:  Warm and dry, no erythema, no ulcerations.   Data Reviewed: I have personally reviewed following labs and imaging studies  CBC: Recent Labs  Lab 04/16/21 0845 04/17/21 0135  WBC 15.3* 13.8*  HGB 11.3* 11.1*  HCT 35.3* 35.0*  MCV 94.9 95.1  PLT 181 [id number]   Basic Metabolic Panel: Recent Labs  Lab 04/16/21 0845 04/17/21 0135  NA 136 140  K 4.1 5.0  CL 104 108  CO2 24 23  GLUCOSE 119* 113*  BUN 18 19  CREATININE 1.50* 1.57*  CALCIUM 9.1 8.9   GFR: Estimated Creatinine Clearance: 20.1 mL/min (A) (by C-G formula based on SCr of 1.57 mg/dL (H)). Liver Function Tests: Recent Labs  Lab 04/17/21 0135  AST 20  ALT 15  ALKPHOS 57  BILITOT 1.4*  PROT 6.9  ALBUMIN 3.1*   No results for input(s): LIPASE, AMYLASE in the last 168 hours. No results for input(s): AMMONIA in the last 168 hours. Coagulation Profile: No results for input(s): INR, PROTIME in the last 168 hours. Cardiac Enzymes: No results for input(s): CKTOTAL, CKMB, CKMBINDEX, TROPONINI in the last 168 hours. BNP (last 3 results) No results for input(s): PROBNP in the last 8760 hours. HbA1C: No results for input(s): HGBA1C in the last 72 hours. CBG: No results for input(s): GLUCAP in the last 168 hours. Lipid Profile: No results for input(s): CHOL, HDL, LDLCALC, TRIG, CHOLHDL, LDLDIRECT in the last 72 hours. Thyroid Function Tests: No results for input(s): TSH, T4TOTAL, FREET4, T3FREE, THYROIDAB in the last 72 hours. Anemia Panel: No results for input(s): VITAMINB12, FOLATE, FERRITIN, TIBC, IRON, RETICCTPCT in the last 72 hours. Sepsis Labs: Recent Labs  Lab 04/16/21 1617 04/16/21 1729  LATICACIDVEN 2.7* 1.1    Recent Results (from the past 240 hour(s))  Resp Panel by RT-PCR (Flu A&B, Covid) Nasopharyngeal Swab     Status: None   Collection Time: 04/16/21  8:45 AM   Specimen: Nasopharyngeal Swab; Nasopharyngeal(NP) swabs in vial transport  medium  Result Value Ref Range Status   SARS Coronavirus 2 by RT PCR NEGATIVE NEGATIVE Final    Comment: (NOTE) SARS-CoV-2 target nucleic acids are NOT DETECTED.  The SARS-CoV-2 RNA is generally detectable in upper respiratory specimens during the acute phase of infection. The lowest concentration of SARS-CoV-2 viral copies this assay can detect is 138 copies/mL. A negative result does not preclude SARS-Cov-2 infection and should not be used as the sole basis for treatment or other patient management decisions. A negative result may occur with  improper specimen collection/handling, submission of specimen other than nasopharyngeal swab, presence of viral mutation(s) within the areas targeted by this assay, and inadequate number of viral copies(<138 copies/mL). A negative result must be combined with clinical observations, patient history, and epidemiological information. The expected result is Negative.  Fact Sheet for Patients:  EntrepreneurPulse.com.au  Fact Sheet for Healthcare Providers:  IncredibleEmployment.be  This test is no t yet approved or cleared by the [place] FDA and  has been authorized for detection and/or diagnosis  of SARS-CoV-2 by FDA under an Emergency Use Authorization (EUA). This EUA will remain  in effect (meaning this test can be used) for the duration of the COVID-19 declaration under Section 564(b)(1) of the Act, 21 U.S.C.section 360bbb-3(b)(1), unless the authorization is terminated  or revoked sooner.       Influenza A by PCR NEGATIVE NEGATIVE Final   Influenza B by PCR NEGATIVE NEGATIVE Final    Comment: (NOTE) The Xpert Xpress SARS-CoV-2/FLU/RSV plus assay is intended as an aid in the diagnosis of influenza from Nasopharyngeal swab specimens and should not be used as a sole basis for treatment. Nasal washings and aspirates are unacceptable for Xpert Xpress SARS-CoV-2/FLU/RSV testing.  Fact Sheet for  Patients: EntrepreneurPulse.com.au  Fact Sheet for Healthcare Providers: IncredibleEmployment.be  This test is not yet approved or cleared by the [place] FDA and has been authorized for detection and/or diagnosis of SARS-CoV-2 by FDA under an Emergency Use Authorization (EUA). This EUA will remain in effect (meaning this test can be used) for the duration of the COVID-19 declaration under Section 564(b)(1) of the Act, 21 U.S.C. section 360bbb-3(b)(1), unless the authorization is terminated or revoked.  Performed at [facility], [place] [address], [place], [place] [address]          Radiology Studies: CT ABDOMEN PELVIS WO CONTRAST  Result Date: 04/16/2021 CLINICAL DATA:  Weakness, fever and chills. EXAM: CT ABDOMEN AND PELVIS WITHOUT CONTRAST TECHNIQUE: Multidetector CT imaging of the abdomen and pelvis was performed following the standard protocol without IV contrast. COMPARISON:  Nov 21, 2011 FINDINGS: Lower chest: Mild to moderate severity areas of scarring and/or atelectasis are seen within the bilateral lung bases. Small bilateral pleural effusions are seen, left greater than right. Hepatobiliary: No focal liver abnormality is seen. No gallstones, gallbladder wall thickening, or biliary dilatation. Pancreas: Unremarkable. No pancreatic ductal dilatation or surrounding inflammatory changes. Spleen: Normal in size without focal abnormality. Adrenals/Urinary Tract: Adrenal glands are unremarkable. Kidneys are normal in size, without focal lesions. There is mild bilateral hydronephrosis without evidence of renal calculi. A mild amount of air is seen within the lumen of a moderately distended urinary bladder. Stomach/Bowel: Stomach is within normal limits. The appendix is not identified. No evidence of bowel dilatation. Diverticula are seen within descending and sigmoid colon. Moderate to marked severity thickening of the proximal to mid sigmoid  colon is also seen. Vascular/Lymphatic: Aortic atherosclerosis. No enlarged abdominal or pelvic lymph nodes. Reproductive: Uterus and bilateral adnexa are unremarkable. Other: No abdominal wall hernia or abnormality. No abdominopelvic ascites. Musculoskeletal: Multilevel degenerative changes are seen throughout the lumbar spine. IMPRESSION: 1. Moderate to marked severity sigmoid colitis and/or diverticulitis. 2. Small bilateral pleural effusions, left greater than right. 3. Mild bilateral hydronephrosis without evidence of renal calculi. 4. Small amount of air within the lumen of a moderately distended urinary bladder. Correlation with recent instrumentation is recommended. Aortic Atherosclerosis (ICD10-I70.0). Electronically Signed   By: Virgina Norfolk M.D.   On: 04/16/2021 15:19   DG Chest 2 View  Result Date: 04/16/2021 CLINICAL DATA:  Shortness of breath and fever for 3 days EXAM: CHEST - 2 VIEW COMPARISON:  01/27/2017 FINDINGS: Streaky, indistinct density at the lung bases. A similar appearance was seen on prior but not on abdominal CT 11/21/2011. Possible 7 mm nodule in the right lower lung. Mildly enlarged heart size. Dual-chamber pacer leads from the left in unremarkable position. IMPRESSION: 1. Opacity at the bases that could be atelectasis, scarring, or bronchopneumonia. 2. Equivocal  7 mm nodule at the right lung base. Recommend follow-up after treatment to determine persistence. Electronically Signed   By: Jorje Guild M.D.   On: 04/16/2021 09:32   DG CHEST PORT 1 VIEW  Result Date: 04/17/2021 CLINICAL DATA:  Hypoxia and shortness of breath. EXAM: PORTABLE CHEST 1 VIEW COMPARISON:  04/16/2021 FINDINGS: Cardiac pacemaker. Cardiac enlargement. Since the previous study, there are increasing streaky perihilar and basilar infiltrates in the lungs suggesting progressing edema or pneumonia. No pleural effusions. No pneumothorax. Mediastinal contours appear intact. Calcification of the aorta.  IMPRESSION: Increasing infiltration in the lung suggesting increasing edema or pneumonia. Cardiac enlargement. Electronically Signed   By: Lucienne Capers M.D.   On: 04/17/2021 03:49    Scheduled Meds:  amiodarone  200 mg Oral Once per day on Mon Tue Wed Thu Fri Sat   digoxin  0.0625 mg Oral Q48H   influenza vaccine adjuvanted  0.5 mL Intramuscular Tomorrow-1000   Rivaroxaban  15 mg Oral Daily   Continuous Infusions:  cefTRIAXone (ROCEPHIN)  IV     metronidazole 500 mg (04/17/21 0114)     LOS: 0 days   Time spent: 8mn  Guyla Bless C Remie Mathison, DO [facility]  If 7PM-7AM, please contact night-coverage [email or web address]  04/17/2021, 7:34 AM

## 2021-04-17 NOTE — Plan of Care (Signed)
  Problem: Urinary Elimination: Goal: Signs and symptoms of infection will decrease Outcome: Progressing   Problem: Fluid Volume: Goal: Hemodynamic stability will improve Outcome: Progressing   Problem: Clinical Measurements: Goal: Diagnostic test results will improve Outcome: Progressing Goal: Signs and symptoms of infection will decrease Outcome: Progressing   Problem: Respiratory: Goal: Ability to maintain adequate ventilation will improve Outcome: Progressing

## 2021-04-17 NOTE — Plan of Care (Signed)
  Problem: Urinary Elimination: Goal: Signs and symptoms of infection will decrease 04/17/2021 1755 by Salina April, RN Outcome: Progressing 04/17/2021 1755 by Salina April, RN Outcome: Progressing   Problem: Fluid Volume: Goal: Hemodynamic stability will improve 04/17/2021 1755 by Salina April, RN Outcome: Progressing 04/17/2021 1755 by Salina April, RN Outcome: Progressing   Problem: Clinical Measurements: Goal: Diagnostic test results will improve 04/17/2021 1755 by Salina April, RN Outcome: Progressing 04/17/2021 1755 by Salina April, RN Outcome: Progressing Goal: Signs and symptoms of infection will decrease 04/17/2021 1755 by Salina April, RN Outcome: Progressing 04/17/2021 1755 by Salina April, RN Outcome: Progressing   Problem: Respiratory: Goal: Ability to maintain adequate ventilation will improve 04/17/2021 1755 by Salina April, RN Outcome: Progressing 04/17/2021 1755 by Salina April, RN Outcome: Progressing

## 2021-04-18 DIAGNOSIS — E861 Hypovolemia: Secondary | ICD-10-CM | POA: Diagnosis present

## 2021-04-18 DIAGNOSIS — M810 Age-related osteoporosis without current pathological fracture: Secondary | ICD-10-CM | POA: Diagnosis present

## 2021-04-18 DIAGNOSIS — I700 Atherosclerosis of aorta: Secondary | ICD-10-CM | POA: Diagnosis present

## 2021-04-18 DIAGNOSIS — Z87440 Personal history of urinary (tract) infections: Secondary | ICD-10-CM | POA: Diagnosis not present

## 2021-04-18 DIAGNOSIS — K529 Noninfective gastroenteritis and colitis, unspecified: Secondary | ICD-10-CM | POA: Diagnosis present

## 2021-04-18 DIAGNOSIS — Z66 Do not resuscitate: Secondary | ICD-10-CM | POA: Diagnosis present

## 2021-04-18 DIAGNOSIS — I482 Chronic atrial fibrillation, unspecified: Secondary | ICD-10-CM | POA: Diagnosis present

## 2021-04-18 DIAGNOSIS — Z20822 Contact with and (suspected) exposure to covid-19: Secondary | ICD-10-CM | POA: Diagnosis present

## 2021-04-18 DIAGNOSIS — I495 Sick sinus syndrome: Secondary | ICD-10-CM | POA: Diagnosis present

## 2021-04-18 DIAGNOSIS — A419 Sepsis, unspecified organism: Secondary | ICD-10-CM | POA: Diagnosis present

## 2021-04-18 DIAGNOSIS — I340 Nonrheumatic mitral (valve) insufficiency: Secondary | ICD-10-CM | POA: Diagnosis present

## 2021-04-18 DIAGNOSIS — Z9049 Acquired absence of other specified parts of digestive tract: Secondary | ICD-10-CM | POA: Diagnosis not present

## 2021-04-18 DIAGNOSIS — I5022 Chronic systolic (congestive) heart failure: Secondary | ICD-10-CM | POA: Diagnosis present

## 2021-04-18 DIAGNOSIS — Z79899 Other long term (current) drug therapy: Secondary | ICD-10-CM | POA: Diagnosis not present

## 2021-04-18 DIAGNOSIS — Z87891 Personal history of nicotine dependence: Secondary | ICD-10-CM | POA: Diagnosis not present

## 2021-04-18 DIAGNOSIS — N184 Chronic kidney disease, stage 4 (severe): Secondary | ICD-10-CM | POA: Diagnosis present

## 2021-04-18 DIAGNOSIS — R6520 Severe sepsis without septic shock: Secondary | ICD-10-CM | POA: Diagnosis not present

## 2021-04-18 DIAGNOSIS — Z950 Presence of cardiac pacemaker: Secondary | ICD-10-CM | POA: Diagnosis not present

## 2021-04-18 DIAGNOSIS — N136 Pyonephrosis: Secondary | ICD-10-CM | POA: Diagnosis present

## 2021-04-18 DIAGNOSIS — Z8673 Personal history of transient ischemic attack (TIA), and cerebral infarction without residual deficits: Secondary | ICD-10-CM | POA: Diagnosis not present

## 2021-04-18 DIAGNOSIS — Z23 Encounter for immunization: Secondary | ICD-10-CM | POA: Diagnosis present

## 2021-04-18 DIAGNOSIS — Z7901 Long term (current) use of anticoagulants: Secondary | ICD-10-CM | POA: Diagnosis not present

## 2021-04-18 DIAGNOSIS — Z8616 Personal history of COVID-19: Secondary | ICD-10-CM | POA: Diagnosis not present

## 2021-04-18 DIAGNOSIS — G4700 Insomnia, unspecified: Secondary | ICD-10-CM | POA: Diagnosis present

## 2021-04-18 LAB — URINE CULTURE: Culture: NO GROWTH

## 2021-04-18 LAB — BASIC METABOLIC PANEL
Anion gap: 9 (ref 5–15)
BUN: 23 mg/dL (ref 8–23)
CO2: 21 mmol/L — ABNORMAL LOW (ref 22–32)
Calcium: 8.6 mg/dL — ABNORMAL LOW (ref 8.9–10.3)
Chloride: 106 mmol/L (ref 98–111)
Creatinine, Ser: 1.62 mg/dL — ABNORMAL HIGH (ref 0.44–1.00)
GFR, Estimated: 30 mL/min — ABNORMAL LOW (ref >60)
Glucose, Bld: 117 mg/dL — ABNORMAL HIGH (ref 70–99)
Potassium: 3.9 mmol/L (ref 3.5–5.1)
Sodium: 136 mmol/L (ref 135–145)

## 2021-04-18 LAB — CBC
HCT: 32.4 % — ABNORMAL LOW (ref 36.0–46.0)
Hemoglobin: 10.5 g/dL — ABNORMAL LOW (ref 12.0–15.0)
MCH: 30.3 pg (ref 26.0–34.0)
MCHC: 32.4 g/dL (ref 30.0–36.0)
MCV: 93.4 fL (ref 80.0–100.0)
Platelets: 151 10*3/uL (ref 150–400)
RBC: 3.47 MIL/uL — ABNORMAL LOW (ref 3.87–5.11)
RDW: 15.1 % (ref 11.5–15.5)
WBC: 8.4 10*3/uL (ref 4.0–10.5)
nRBC: 0 % (ref 0.0–0.2)

## 2021-04-18 NOTE — Progress Notes (Signed)
PROGRESS NOTE    Tracy Vasquez  [id number] DOB: 1930/05/18 DOA: 04/16/2021 PCP: Lillard Anes, MD    Brief Narrative:  87 year old from assisted living, chronic A. fib on Xarelto, CKD stage IV, tachybradycardia syndrome status post pacemaker presented with 5 days of fever and chills and right kidney area pain.  Admitted with sepsis secondary to UTI and pyelonephritis also questionable colitis.   Assessment & Plan:   Active Problems:   Chronic atrial fibrillation (HCC)   Chronic kidney disease, stage 4 (severe) (HCC)   Chronic systolic CHF (congestive heart failure) (HCC)   Tachycardia-bradycardia syndrome ([place])   Colitis   UTI (urinary tract infection)   Sepsis (HCC)  Sepsis secondary to UTI and pyonephritis: Presented with leukocytosis, tachycardia, fever.  Abnormal urine.  CT scan consistent with sigmoid diverticulitis. Continue Rocephin and Flagyl.  Blood cultures and urine cultures pending.  Anticipate conservative management.  No evidence of obvious hydronephrosis or obstructing stone.  No evidence of urinary retention.  Chronic A. fib and tachybradycardia syndrome status post pacemaker: She is on amiodarone, digoxin, metoprolol and Xarelto.  Rate controlled.  CKD stage IV: Creatinine at about baseline.  Chronic systolic congestive heart failure: Euvolemic.     DVT prophylaxis:  Rivaroxaban (XARELTO) tablet 15 mg   Code Status: DNR Family Communication: Daughter on the speaker phone. Disposition Plan: Status is: Observation  The patient will require care spanning > 2 midnights and should be moved to inpatient because: Needing IV antibiotics and monitoring in the hospital.        Consultants:  None  Procedures:  None  Antimicrobials:  Rocephin and Flagyl 10/12---   Subjective: Patient seen and examined.  She had explosive diarrhea early morning and felt better.  Remains afebrile.  Still has some right flank pain but much better than  before.  Denies any nausea vomiting.  Objective: Vitals:   04/17/21 1216 04/17/21 2038 04/18/21 0452 04/18/21 1121  BP: 110/75 128/80 140/65 124/72  Pulse: 70 82 71 71  Resp: '18 18 18 18  '$ Temp: (!) 97 F (36.1 C) 98.5 F (36.9 C) 98.5 F (36.9 C) 98.1 F (36.7 C)  TempSrc: Oral Oral Oral Oral  SpO2: 95% 92% 97% 97%  Weight:   60.4 kg   Height:        Intake/Output Summary (Last 24 hours) at 04/18/2021 1318 Last data filed at 04/18/2021 1122 Gross per 24 hour  Intake 220 ml  Output 2000 ml  Net -1780 ml   Filed Weights   04/16/21 1854 04/17/21 0521 04/18/21 0452  Weight: 59.9 kg 60 kg 60.4 kg    Examination:  General exam: Appears calm and comfortable  Respiratory system: Clear to auscultation. Respiratory effort normal. Cardiovascular system: S1 & S2 heard, RRR. No JVD, murmurs, rubs, gallops or clicks. No pedal edema. Gastrointestinal system: Nontender.  Bowel sounds present. Central nervous system: Alert and oriented. No focal neurological deficits. Extremities: Symmetric 5 x 5 power. Skin: No rashes, lesions or ulcers Psychiatry: Judgement and insight appear normal. Mood & affect appropriate.     Data Reviewed: I have personally reviewed following labs and imaging studies  CBC: Recent Labs  Lab 04/16/21 0845 04/17/21 0135 04/18/21 0342  WBC 15.3* 13.8* 8.4  HGB 11.3* 11.1* 10.5*  HCT 35.3* 35.0* 32.4*  MCV 94.9 95.1 93.4  PLT 181 167 [id number]   Basic Metabolic Panel: Recent Labs  Lab 04/16/21 0845 04/17/21 0135 04/18/21 0342  NA 136 140 136  K 4.1 5.0 3.9  CL 104 108 106  CO2 24 23 21*  GLUCOSE 119* 113* 117*  BUN '18 19 23  '$ CREATININE 1.50* 1.57* 1.62*  CALCIUM 9.1 8.9 8.6*   GFR: Estimated Creatinine Clearance: 19.5 mL/min (A) (by C-G formula based on SCr of 1.62 mg/dL (H)). Liver Function Tests: Recent Labs  Lab 04/17/21 0135  AST 20  ALT 15  ALKPHOS 57  BILITOT 1.4*  PROT 6.9  ALBUMIN 3.1*   No results for input(s): LIPASE,  AMYLASE in the last 168 hours. No results for input(s): AMMONIA in the last 168 hours. Coagulation Profile: No results for input(s): INR, PROTIME in the last 168 hours. Cardiac Enzymes: No results for input(s): CKTOTAL, CKMB, CKMBINDEX, TROPONINI in the last 168 hours. BNP (last 3 results) No results for input(s): PROBNP in the last 8760 hours. HbA1C: No results for input(s): HGBA1C in the last 72 hours. CBG: No results for input(s): GLUCAP in the last 168 hours. Lipid Profile: No results for input(s): CHOL, HDL, LDLCALC, TRIG, CHOLHDL, LDLDIRECT in the last 72 hours. Thyroid Function Tests: No results for input(s): TSH, T4TOTAL, FREET4, T3FREE, THYROIDAB in the last 72 hours. Anemia Panel: No results for input(s): VITAMINB12, FOLATE, FERRITIN, TIBC, IRON, RETICCTPCT in the last 72 hours. Sepsis Labs: Recent Labs  Lab 04/16/21 1617 04/16/21 1729  LATICACIDVEN 2.7* 1.1    Recent Results (from the past 240 hour(s))  Resp Panel by RT-PCR (Flu A&B, Covid) Nasopharyngeal Swab     Status: None   Collection Time: 04/16/21  8:45 AM   Specimen: Nasopharyngeal Swab; Nasopharyngeal(NP) swabs in vial transport medium  Result Value Ref Range Status   SARS Coronavirus 2 by RT PCR NEGATIVE NEGATIVE Final    Comment: (NOTE) SARS-CoV-2 target nucleic acids are NOT DETECTED.  The SARS-CoV-2 RNA is generally detectable in upper respiratory specimens during the acute phase of infection. The lowest concentration of SARS-CoV-2 viral copies this assay can detect is 138 copies/mL. A negative result does not preclude SARS-Cov-2 infection and should not be used as the sole basis for treatment or other patient management decisions. A negative result may occur with  improper specimen collection/handling, submission of specimen other than nasopharyngeal swab, presence of viral mutation(s) within the areas targeted by this assay, and inadequate number of viral copies(<138 copies/mL). A negative result  must be combined with clinical observations, patient history, and epidemiological information. The expected result is Negative.  Fact Sheet for Patients:  EntrepreneurPulse.com.au  Fact Sheet for Healthcare Providers:  IncredibleEmployment.be  This test is no t yet approved or cleared by the [place] FDA and  has been authorized for detection and/or diagnosis of SARS-CoV-2 by FDA under an Emergency Use Authorization (EUA). This EUA will remain  in effect (meaning this test can be used) for the duration of the COVID-19 declaration under Section 564(b)(1) of the Act, 21 U.S.C.section 360bbb-3(b)(1), unless the authorization is terminated  or revoked sooner.       Influenza A by PCR NEGATIVE NEGATIVE Final   Influenza B by PCR NEGATIVE NEGATIVE Final    Comment: (NOTE) The Xpert Xpress SARS-CoV-2/FLU/RSV plus assay is intended as an aid in the diagnosis of influenza from Nasopharyngeal swab specimens and should not be used as a sole basis for treatment. Nasal washings and aspirates are unacceptable for Xpert Xpress SARS-CoV-2/FLU/RSV testing.  Fact Sheet for Patients: EntrepreneurPulse.com.au  Fact Sheet for Healthcare Providers: IncredibleEmployment.be  This test is not yet approved or cleared by the [place] and has been authorized for  detection and/or diagnosis of SARS-CoV-2 by FDA under an Emergency Use Authorization (EUA). This EUA will remain in effect (meaning this test can be used) for the duration of the COVID-19 declaration under Section 564(b)(1) of the Act, 21 U.S.C. section 360bbb-3(b)(1), unless the authorization is terminated or revoked.  Performed at [facility], [place] [address] [place], [place] [address]   Blood culture (routine x 2)     Status: None (Preliminary result)   Collection Time: 04/16/21  3:55 PM   Specimen: BLOOD LEFT ARM  Result Value Ref Range Status    Specimen Description BLOOD LEFT ARM  Final   Special Requests   Final    BOTTLES DRAWN AEROBIC AND ANAEROBIC Blood Culture adequate volume   Culture   Final    NO GROWTH 2 DAYS Performed at [facility], [place] [address], [place], [place] [address]    Report Status PENDING  Incomplete  Blood culture (routine x 2)     Status: None (Preliminary result)   Collection Time: 04/16/21  4:00 PM   Specimen: BLOOD RIGHT ARM  Result Value Ref Range Status   Specimen Description BLOOD RIGHT ARM  Final   Special Requests   Final    BOTTLES DRAWN AEROBIC AND ANAEROBIC Blood Culture results may not be optimal due to an inadequate volume of blood received in culture bottles   Culture   Final    NO GROWTH 2 DAYS Performed at [facility], [place] [address], [place], [place] [address]    Report Status PENDING  Incomplete  Urine Culture     Status: None   Collection Time: 04/17/21 12:36 AM   Specimen: Urine, Clean Catch  Result Value Ref Range Status   Specimen Description URINE, CLEAN CATCH  Final   Special Requests NONE  Final   Culture   Final    NO GROWTH Performed at [facility], [place] [address], [place], [place] [address]    Report Status 04/18/2021 FINAL  Final         Radiology Studies: CT ABDOMEN PELVIS WO CONTRAST  Result Date: 04/16/2021 CLINICAL DATA:  Weakness, fever and chills. EXAM: CT ABDOMEN AND PELVIS WITHOUT CONTRAST TECHNIQUE: Multidetector CT imaging of the abdomen and pelvis was performed following the standard protocol without IV contrast. COMPARISON:  Nov 21, 2011 FINDINGS: Lower chest: Mild to moderate severity areas of scarring and/or atelectasis are seen within the bilateral lung bases. Small bilateral pleural effusions are seen, left greater than right. Hepatobiliary: No focal liver abnormality is seen. No gallstones, gallbladder wall thickening, or biliary dilatation. Pancreas: Unremarkable. No pancreatic ductal dilatation or surrounding inflammatory  changes. Spleen: Normal in size without focal abnormality. Adrenals/Urinary Tract: Adrenal glands are unremarkable. Kidneys are normal in size, without focal lesions. There is mild bilateral hydronephrosis without evidence of renal calculi. A mild amount of air is seen within the lumen of a moderately distended urinary bladder. Stomach/Bowel: Stomach is within normal limits. The appendix is not identified. No evidence of bowel dilatation. Diverticula are seen within descending and sigmoid colon. Moderate to marked severity thickening of the proximal to mid sigmoid colon is also seen. Vascular/Lymphatic: Aortic atherosclerosis. No enlarged abdominal or pelvic lymph nodes. Reproductive: Uterus and bilateral adnexa are unremarkable. Other: No abdominal wall hernia or abnormality. No abdominopelvic ascites. Musculoskeletal: Multilevel degenerative changes are seen throughout the lumbar spine. IMPRESSION: 1. Moderate to marked severity sigmoid colitis and/or diverticulitis. 2. Small bilateral pleural effusions, left greater than  right. 3. Mild bilateral hydronephrosis without evidence of renal calculi. 4. Small amount of air within the lumen of a moderately distended urinary bladder. Correlation with recent instrumentation is recommended. Aortic Atherosclerosis (ICD10-I70.0). Electronically Signed   By: Virgina Norfolk M.D.   On: 04/16/2021 15:19   DG CHEST PORT 1 VIEW  Result Date: 04/17/2021 CLINICAL DATA:  Hypoxia and shortness of breath. EXAM: PORTABLE CHEST 1 VIEW COMPARISON:  04/16/2021 FINDINGS: Cardiac pacemaker. Cardiac enlargement. Since the previous study, there are increasing streaky perihilar and basilar infiltrates in the lungs suggesting progressing edema or pneumonia. No pleural effusions. No pneumothorax. Mediastinal contours appear intact. Calcification of the aorta. IMPRESSION: Increasing infiltration in the lung suggesting increasing edema or pneumonia. Cardiac enlargement. Electronically  Signed   By: Lucienne Capers M.D.   On: 04/17/2021 03:49        Scheduled Meds:  amiodarone  200 mg Oral Once per day on Mon Tue Wed Thu Fri Sat   digoxin  0.0625 mg Oral Q48H   influenza vaccine adjuvanted  0.5 mL Intramuscular Tomorrow-1000   metoprolol tartrate  100 mg Oral BID   Rivaroxaban  15 mg Oral Daily   Continuous Infusions:  cefTRIAXone (ROCEPHIN)  IV 2 g (04/18/21 1251)   metronidazole 500 mg (04/18/21 1001)     LOS: 0 days    Time spent: 30 minutes    Barb Merino, MD [facility] Pager [phone]

## 2021-04-19 LAB — BASIC METABOLIC PANEL
Anion gap: 9 (ref 5–15)
BUN: 25 mg/dL — ABNORMAL HIGH (ref 8–23)
CO2: 21 mmol/L — ABNORMAL LOW (ref 22–32)
Calcium: 8.6 mg/dL — ABNORMAL LOW (ref 8.9–10.3)
Chloride: 107 mmol/L (ref 98–111)
Creatinine, Ser: 1.59 mg/dL — ABNORMAL HIGH (ref 0.44–1.00)
GFR, Estimated: 31 mL/min — ABNORMAL LOW (ref >60)
Glucose, Bld: 102 mg/dL — ABNORMAL HIGH (ref 70–99)
Potassium: 3.9 mmol/L (ref 3.5–5.1)
Sodium: 137 mmol/L (ref 135–145)

## 2021-04-19 LAB — CBC
HCT: 32.1 % — ABNORMAL LOW (ref 36.0–46.0)
Hemoglobin: 10.2 g/dL — ABNORMAL LOW (ref 12.0–15.0)
MCH: 30.1 pg (ref 26.0–34.0)
MCHC: 31.8 g/dL (ref 30.0–36.0)
MCV: 94.7 fL (ref 80.0–100.0)
Platelets: 157 10*3/uL (ref 150–400)
RBC: 3.39 MIL/uL — ABNORMAL LOW (ref 3.87–5.11)
RDW: 14.8 % (ref 11.5–15.5)
WBC: 8.9 10*3/uL (ref 4.0–10.5)
nRBC: 0 % (ref 0.0–0.2)

## 2021-04-19 LAB — PHOSPHORUS: Phosphorus: 2.3 mg/dL — ABNORMAL LOW (ref 2.5–4.6)

## 2021-04-19 LAB — MAGNESIUM: Magnesium: 2 mg/dL (ref 1.7–2.4)

## 2021-04-19 MED ORDER — SODIUM CHLORIDE 0.9 % IV SOLN
2.0000 g | Freq: Once | INTRAVENOUS | Status: AC
  Administered 2021-04-19: 2 g via INTRAVENOUS
  Filled 2021-04-19: qty 20

## 2021-04-19 MED ORDER — CEPHALEXIN 500 MG PO CAPS: 500.0000 mg | ORAL_CAPSULE | Freq: Two times a day (BID) | ORAL | 0 refills | Status: AC

## 2021-04-19 MED ORDER — METRONIDAZOLE 500 MG PO TABS: 500.0000 mg | ORAL_TABLET | Freq: Two times a day (BID) | ORAL | 0 refills | Status: AC

## 2021-04-19 NOTE — Discharge Summary (Signed)
Physician Discharge Summary  Svetlana Illa [id number] DOB: Nov 13, 1929 DOA: 04/16/2021  PCP: Lillard Anes, MD  Admit date: 04/16/2021 Discharge date: 04/19/2021  Admitted From: Home Disposition: Home  Recommendations for Outpatient Follow-up:  Follow up with PCP in 1-2 weeks   Home Health: Not applicable Equipment/Devices: Not applicable  Discharge Condition: Stable CODE STATUS: DNR Diet recommendation: Regular diet  Discharge summary: 87 year old from independent living facility, chronic A. fib on Xarelto, CKD stage IV, tachybradycardia syndrome status post pacemaker presented to the hospital with 5 days of fever chills and right flank pain.  She was found to be have sepsis secondary to UTI and pyelonephritis as well as suspected sigmoid diverticulitis on CT scan.  Admitted with IV fluids, treated with IV antibiotics with Rocephin and Flagyl.  Blood cultures negative.  Urine cultures negative.  Good clinical recovery.  Symptomatically improved. Sepsis present on admission and improved.  Plan: We will treat with 10 days of Keflex and Flagyl for presumed diverticulitis. Patient does note benefit with colonoscopy evaluation given advanced age and because of diverticulitis is likely infectious. Stable for discharge on oral antibiotics.   Discharge Diagnoses:  Active Problems:   Chronic atrial fibrillation (HCC)   Chronic kidney disease, stage 4 (severe) (HCC)   Chronic systolic CHF (congestive heart failure) (HCC)   Tachycardia-bradycardia syndrome ([place])   Colitis   UTI (urinary tract infection)   Sepsis [facility])    Discharge Instructions  Discharge Instructions     Call MD for:  persistant nausea and vomiting   Complete by: As directed    Call MD for:  severe uncontrolled pain   Complete by: As directed    Call MD for:  temperature >100.4   Complete by: As directed    Diet general   Complete by: As directed    Increase activity slowly   Complete by: As  directed       Allergies as of 04/19/2021   No Known Allergies      Medication List     STOP taking these medications    LORazepam 0.5 MG tablet Commonly known as: ATIVAN       TAKE these medications    acetaminophen 500 MG tablet Commonly known as: TYLENOL Take 1,000 mg by mouth every 6 (six) hours as needed for mild pain.   amiodarone 200 MG tablet Commonly known as: PACERONE TAKE 1 TABLET ON MONDAY, TUESDAY, WEDNESDAY, THURSDAY, [place], AND SATURDAY. DO NOT TAKE ANY ON SUNDAY. What changed: See the new instructions.   cephALEXin 500 MG capsule Commonly known as: KEFLEX Take 1 capsule (500 mg total) by mouth 2 (two) times daily for 7 days.   digoxin 0.125 MG tablet Commonly known as: LANOXIN Take 0.5 tablets (0.0625 mg total) by mouth daily. Please keep upcoming appt in July 2022 with Dr. Lovena Le before anymore refills. Thank you What changed:  when to take this additional instructions   estradiol 0.1 MG/GM vaginal cream Commonly known as: ESTRACE Place 1 Applicatorful vaginally every other day.   Lokelma 10 g Pack packet Generic drug: sodium zirconium cyclosilicate Take 10 g by mouth 2 (two) times a week. Monday's and Thursday's   metoprolol tartrate 100 MG tablet Commonly known as: LOPRESSOR Take 1 tablet (100 mg total) by mouth 2 (two) times daily. Please make overdue appt with Dr. Lovena Le before anymore refills. Thank you 2nd attempt What changed: additional instructions   metroNIDAZOLE 500 MG tablet Commonly known as: Flagyl Take 1 tablet (500 mg total) by mouth  2 (two) times daily for 7 days.   VITAMIN C PO Take 1 tablet by mouth daily.   Xarelto 15 MG Tabs tablet Generic drug: Rivaroxaban TAKE 1 TABLET BY MOUTH DAILY WITH SUPPER What changed: how much to take        No Known Allergies  Consultations: None   Procedures/Studies: CT ABDOMEN PELVIS WO CONTRAST  Result Date: 04/16/2021 CLINICAL DATA:  Weakness, fever and chills.  EXAM: CT ABDOMEN AND PELVIS WITHOUT CONTRAST TECHNIQUE: Multidetector CT imaging of the abdomen and pelvis was performed following the standard protocol without IV contrast. COMPARISON:  Nov 21, 2011 FINDINGS: Lower chest: Mild to moderate severity areas of scarring and/or atelectasis are seen within the bilateral lung bases. Small bilateral pleural effusions are seen, left greater than right. Hepatobiliary: No focal liver abnormality is seen. No gallstones, gallbladder wall thickening, or biliary dilatation. Pancreas: Unremarkable. No pancreatic ductal dilatation or surrounding inflammatory changes. Spleen: Normal in size without focal abnormality. Adrenals/Urinary Tract: Adrenal glands are unremarkable. Kidneys are normal in size, without focal lesions. There is mild bilateral hydronephrosis without evidence of renal calculi. A mild amount of air is seen within the lumen of a moderately distended urinary bladder. Stomach/Bowel: Stomach is within normal limits. The appendix is not identified. No evidence of bowel dilatation. Diverticula are seen within descending and sigmoid colon. Moderate to marked severity thickening of the proximal to mid sigmoid colon is also seen. Vascular/Lymphatic: Aortic atherosclerosis. No enlarged abdominal or pelvic lymph nodes. Reproductive: Uterus and bilateral adnexa are unremarkable. Other: No abdominal wall hernia or abnormality. No abdominopelvic ascites. Musculoskeletal: Multilevel degenerative changes are seen throughout the lumbar spine. IMPRESSION: 1. Moderate to marked severity sigmoid colitis and/or diverticulitis. 2. Small bilateral pleural effusions, left greater than right. 3. Mild bilateral hydronephrosis without evidence of renal calculi. 4. Small amount of air within the lumen of a moderately distended urinary bladder. Correlation with recent instrumentation is recommended. Aortic Atherosclerosis (ICD10-I70.0). Electronically Signed   By: Virgina Norfolk M.D.   On:  04/16/2021 15:19   DG Chest 2 View  Result Date: 04/16/2021 CLINICAL DATA:  Shortness of breath and fever for 3 days EXAM: CHEST - 2 VIEW COMPARISON:  01/27/2017 FINDINGS: Streaky, indistinct density at the lung bases. A similar appearance was seen on prior but not on abdominal CT 11/21/2011. Possible 7 mm nodule in the right lower lung. Mildly enlarged heart size. Dual-chamber pacer leads from the left in unremarkable position. IMPRESSION: 1. Opacity at the bases that could be atelectasis, scarring, or bronchopneumonia. 2. Equivocal 7 mm nodule at the right lung base. Recommend follow-up after treatment to determine persistence. Electronically Signed   By: Jorje Guild M.D.   On: 04/16/2021 09:32   DG CHEST PORT 1 VIEW  Result Date: 04/17/2021 CLINICAL DATA:  Hypoxia and shortness of breath. EXAM: PORTABLE CHEST 1 VIEW COMPARISON:  04/16/2021 FINDINGS: Cardiac pacemaker. Cardiac enlargement. Since the previous study, there are increasing streaky perihilar and basilar infiltrates in the lungs suggesting progressing edema or pneumonia. No pleural effusions. No pneumothorax. Mediastinal contours appear intact. Calcification of the aorta. IMPRESSION: Increasing infiltration in the lung suggesting increasing edema or pneumonia. Cardiac enlargement. Electronically Signed   By: Lucienne Capers M.D.   On: 04/17/2021 03:49   (Echo, Carotid, EGD, Colonoscopy, ERCP)    Subjective: Patient seen and examined.  Denies any complaints.  Denies any nausea vomiting or abdominal pain.  Still has some loose stool 2 episodes last 24 hours.  Eager to go home.  Discharge Exam: Vitals:   04/18/21 1934 04/19/21 0500  BP: (!) 131/109 139/74  Pulse: 71 70  Resp: 17   Temp: 98.3 F (36.8 C) (!) 96.8 F (36 C)  SpO2: 97%    Vitals:   04/18/21 0452 04/18/21 1121 04/18/21 1934 04/19/21 0500  BP: 140/65 124/72 (!) 131/109 139/74  Pulse: 71 71 71 70  Resp: '18 18 17   '$ Temp: 98.5 F (36.9 C) 98.1 F (36.7 C)  98.3 F (36.8 C) (!) 96.8 F (36 C)  TempSrc: Oral Oral Oral Oral  SpO2: 97% 97% 97%   Weight: 60.4 kg   59.4 kg  Height:        General: Pt is alert, awake, not in acute distress Cardiovascular: RRR, S1/S2 +, no rubs, no gallops Respiratory: CTA bilaterally, no wheezing, no rhonchi, pacemaker in place. Abdominal: Soft, NT, ND, bowel sounds + Extremities: no edema, no cyanosis    The results of significant diagnostics from this hospitalization (including imaging, microbiology, ancillary and laboratory) are listed below for reference.     Microbiology: Recent Results (from the past 240 hour(s))  Resp Panel by RT-PCR (Flu A&B, Covid) Nasopharyngeal Swab     Status: None   Collection Time: 04/16/21  8:45 AM   Specimen: Nasopharyngeal Swab; Nasopharyngeal(NP) swabs in vial transport medium  Result Value Ref Range Status   SARS Coronavirus 2 by RT PCR NEGATIVE NEGATIVE Final    Comment: (NOTE) SARS-CoV-2 target nucleic acids are NOT DETECTED.  The SARS-CoV-2 RNA is generally detectable in upper respiratory specimens during the acute phase of infection. The lowest concentration of SARS-CoV-2 viral copies this assay can detect is 138 copies/mL. A negative result does not preclude SARS-Cov-2 infection and should not be used as the sole basis for treatment or other patient management decisions. A negative result may occur with  improper specimen collection/handling, submission of specimen other than nasopharyngeal swab, presence of viral mutation(s) within the areas targeted by this assay, and inadequate number of viral copies(<138 copies/mL). A negative result must be combined with clinical observations, patient history, and epidemiological information. The expected result is Negative.  Fact Sheet for Patients:  EntrepreneurPulse.com.au  Fact Sheet for Healthcare Providers:  IncredibleEmployment.be  This test is no t yet approved or cleared  by the [place] FDA and  has been authorized for detection and/or diagnosis of SARS-CoV-2 by FDA under an Emergency Use Authorization (EUA). This EUA will remain  in effect (meaning this test can be used) for the duration of the COVID-19 declaration under Section 564(b)(1) of the Act, 21 U.S.C.section 360bbb-3(b)(1), unless the authorization is terminated  or revoked sooner.       Influenza A by PCR NEGATIVE NEGATIVE Final   Influenza B by PCR NEGATIVE NEGATIVE Final    Comment: (NOTE) The Xpert Xpress SARS-CoV-2/FLU/RSV plus assay is intended as an aid in the diagnosis of influenza from Nasopharyngeal swab specimens and should not be used as a sole basis for treatment. Nasal washings and aspirates are unacceptable for Xpert Xpress SARS-CoV-2/FLU/RSV testing.  Fact Sheet for Patients: EntrepreneurPulse.com.au  Fact Sheet for Healthcare Providers: IncredibleEmployment.be  This test is not yet approved or cleared by the [place] FDA and has been authorized for detection and/or diagnosis of SARS-CoV-2 by FDA under an Emergency Use Authorization (EUA). This EUA will remain in effect (meaning this test can be used) for the duration of the COVID-19 declaration under Section 564(b)(1) of the Act, 21 U.S.C. section 360bbb-3(b)(1), unless the authorization is terminated  or revoked.  Performed at [facility], [place] [address] [place], [place] [address]   Blood culture (routine x 2)     Status: None (Preliminary result)   Collection Time: 04/16/21  3:55 PM   Specimen: BLOOD LEFT ARM  Result Value Ref Range Status   Specimen Description BLOOD LEFT ARM  Final   Special Requests   Final    BOTTLES DRAWN AEROBIC AND ANAEROBIC Blood Culture adequate volume   Culture   Final    NO GROWTH 3 DAYS Performed at [facility], [place] [address], [place], [place] [address]    Report Status PENDING  Incomplete  Blood culture (routine x 2)      Status: None (Preliminary result)   Collection Time: 04/16/21  4:00 PM   Specimen: BLOOD RIGHT ARM  Result Value Ref Range Status   Specimen Description BLOOD RIGHT ARM  Final   Special Requests   Final    BOTTLES DRAWN AEROBIC AND ANAEROBIC Blood Culture results may not be optimal due to an inadequate volume of blood received in culture bottles   Culture   Final    NO GROWTH 3 DAYS Performed at [facility], [place] [address], [place], [place] [address]    Report Status PENDING  Incomplete  Urine Culture     Status: None   Collection Time: 04/17/21 12:36 AM   Specimen: Urine, Clean Catch  Result Value Ref Range Status   Specimen Description URINE, CLEAN CATCH  Final   Special Requests NONE  Final   Culture   Final    NO GROWTH Performed at [facility], [place] [address] [place], [place] [address]    Report Status 04/18/2021 FINAL  Final     Labs: BNP (last 3 results) Recent Labs    04/16/21 2215  BNP [id number]*   Basic Metabolic Panel: Recent Labs  Lab 04/16/21 0845 04/17/21 0135 04/18/21 0342 04/19/21 0248  NA 136 140 136 137  K 4.1 5.0 3.9 3.9  CL 104 108 106 107  CO2 24 23 21* 21*  GLUCOSE 119* 113* 117* 102*  BUN '18 19 23 '$ 25*  CREATININE 1.50* 1.57* 1.62* 1.59*  CALCIUM 9.1 8.9 8.6* 8.6*  MG  --   --   --  2.0  PHOS  --   --   --  2.3*   Liver Function Tests: Recent Labs  Lab 04/17/21 0135  AST 20  ALT 15  ALKPHOS 57  BILITOT 1.4*  PROT 6.9  ALBUMIN 3.1*   No results for input(s): LIPASE, AMYLASE in the last 168 hours. No results for input(s): AMMONIA in the last 168 hours. CBC: Recent Labs  Lab 04/16/21 0845 04/17/21 0135 04/18/21 0342 04/19/21 0248  WBC 15.3* 13.8* 8.4 8.9  HGB 11.3* 11.1* 10.5* 10.2*  HCT 35.3* 35.0* 32.4* 32.1*  MCV 94.9 95.1 93.4 94.7  PLT 181 167 151 157   Cardiac Enzymes: No results for input(s): CKTOTAL, CKMB, CKMBINDEX, TROPONINI in the last 168 hours. BNP: Invalid input(s): POCBNP CBG: No results  for input(s): GLUCAP in the last 168 hours. D-Dimer No results for input(s): DDIMER in the last 72 hours. Hgb A1c No results for input(s): HGBA1C in the last 72 hours. Lipid Profile No results for input(s): CHOL, HDL, LDLCALC, TRIG, CHOLHDL, LDLDIRECT in the last 72 hours. Thyroid function studies No results for input(s): TSH, T4TOTAL, T3FREE, THYROIDAB in the last 72 hours.  Invalid input(s): FREET3 Anemia work up  No results for input(s): VITAMINB12, FOLATE, FERRITIN, TIBC, IRON, RETICCTPCT in the last 72 hours. Urinalysis    Component Value Date/Time   COLORURINE YELLOW 04/16/2021 1205   APPEARANCEUR TURBID (A) 04/16/2021 1205   LABSPEC 1.011 04/16/2021 1205   PHURINE 5.0 04/16/2021 1205   GLUCOSEU NEGATIVE 04/16/2021 1205   HGBUR MODERATE (A) 04/16/2021 1205   [place] 04/16/2021 1205   BILIRUBINUR negative 11/29/2020 1025   BILIRUBINUR neg 10/15/2020 1058   KETONESUR NEGATIVE 04/16/2021 1205   PROTEINUR 100 (A) 04/16/2021 1205   UROBILINOGEN 0.2 11/29/2020 1025   UROBILINOGEN 1.0 08/19/2012 2055   NITRITE POSITIVE (A) 04/16/2021 1205   LEUKOCYTESUR LARGE (A) 04/16/2021 1205   Sepsis Labs Invalid input(s): PROCALCITONIN,  WBC,  LACTICIDVEN Microbiology Recent Results (from the past 240 hour(s))  Resp Panel by RT-PCR (Flu A&B, Covid) Nasopharyngeal Swab     Status: None   Collection Time: 04/16/21  8:45 AM   Specimen: Nasopharyngeal Swab; Nasopharyngeal(NP) swabs in vial transport medium  Result Value Ref Range Status   SARS Coronavirus 2 by RT PCR NEGATIVE NEGATIVE Final    Comment: (NOTE) SARS-CoV-2 target nucleic acids are NOT DETECTED.  The SARS-CoV-2 RNA is generally detectable in upper respiratory specimens during the acute phase of infection. The lowest concentration of SARS-CoV-2 viral copies this assay can detect is 138 copies/mL. A negative result does not preclude SARS-Cov-2 infection and should not be used as the sole basis for treatment  or other patient management decisions. A negative result may occur with  improper specimen collection/handling, submission of specimen other than nasopharyngeal swab, presence of viral mutation(s) within the areas targeted by this assay, and inadequate number of viral copies(<138 copies/mL). A negative result must be combined with clinical observations, patient history, and epidemiological information. The expected result is Negative.  Fact Sheet for Patients:  EntrepreneurPulse.com.au  Fact Sheet for Healthcare Providers:  IncredibleEmployment.be  This test is no t yet approved or cleared by the [place] FDA and  has been authorized for detection and/or diagnosis of SARS-CoV-2 by FDA under an Emergency Use Authorization (EUA). This EUA will remain  in effect (meaning this test can be used) for the duration of the COVID-19 declaration under Section 564(b)(1) of the Act, 21 U.S.C.section 360bbb-3(b)(1), unless the authorization is terminated  or revoked sooner.       Influenza A by PCR NEGATIVE NEGATIVE Final   Influenza B by PCR NEGATIVE NEGATIVE Final    Comment: (NOTE) The Xpert Xpress SARS-CoV-2/FLU/RSV plus assay is intended as an aid in the diagnosis of influenza from Nasopharyngeal swab specimens and should not be used as a sole basis for treatment. Nasal washings and aspirates are unacceptable for Xpert Xpress SARS-CoV-2/FLU/RSV testing.  Fact Sheet for Patients: EntrepreneurPulse.com.au  Fact Sheet for Healthcare Providers: IncredibleEmployment.be  This test is not yet approved or cleared by the [place] FDA and has been authorized for detection and/or diagnosis of SARS-CoV-2 by FDA under an Emergency Use Authorization (EUA). This EUA will remain in effect (meaning this test can be used) for the duration of the COVID-19 declaration under Section 564(b)(1) of the Act, 21 U.S.C. section  360bbb-3(b)(1), unless the authorization is terminated or revoked.  Performed at [facility], [place] [address] [place], [place] [address]   Blood culture (routine x 2)     Status: None (Preliminary result)   Collection Time: 04/16/21  3:55 PM   Specimen: BLOOD LEFT ARM  Result Value Ref Range Status   Specimen  Description BLOOD LEFT ARM  Final   Special Requests   Final    BOTTLES DRAWN AEROBIC AND ANAEROBIC Blood Culture adequate volume   Culture   Final    NO GROWTH 3 DAYS Performed at [facility], [address] [place], [place] [address]    Report Status PENDING  Incomplete  Blood culture (routine x 2)     Status: None (Preliminary result)   Collection Time: 04/16/21  4:00 PM   Specimen: BLOOD RIGHT ARM  Result Value Ref Range Status   Specimen Description BLOOD RIGHT ARM  Final   Special Requests   Final    BOTTLES DRAWN AEROBIC AND ANAEROBIC Blood Culture results may not be optimal due to an inadequate volume of blood received in culture bottles   Culture   Final    NO GROWTH 3 DAYS Performed at [facility], [place] [address] [place], [place] [address]    Report Status PENDING  Incomplete  Urine Culture     Status: None   Collection Time: 04/17/21 12:36 AM   Specimen: Urine, Clean Catch  Result Value Ref Range Status   Specimen Description URINE, CLEAN CATCH  Final   Special Requests NONE  Final   Culture   Final    NO GROWTH Performed at [facility], [place] [address] [place],  [address]    Report Status 04/18/2021 FINAL  Final     Time coordinating discharge:  32 minutes  SIGNED:   Barb Merino, MD  [facility] 04/19/2021, 1:46 PM

## 2021-04-19 NOTE — Progress Notes (Signed)
Floor RN advised IV/Vast RN that patient does not need another peripheral at this time. RN to discontinue order.

## 2021-04-21 LAB — CULTURE, BLOOD (ROUTINE X 2)
Culture: NO GROWTH
Culture: NO GROWTH
Special Requests: ADEQUATE

## 2021-04-23 ENCOUNTER — Ambulatory Visit: Payer: Medicare PPO | Admitting: Legal Medicine

## 2021-04-24 ENCOUNTER — Other Ambulatory Visit: Payer: Self-pay | Admitting: Nurse Practitioner

## 2021-04-24 ENCOUNTER — Ambulatory Visit (INDEPENDENT_AMBULATORY_CARE_PROVIDER_SITE_OTHER): Payer: Medicare PPO | Admitting: Nurse Practitioner

## 2021-04-24 ENCOUNTER — Encounter: Payer: Self-pay | Admitting: Nurse Practitioner

## 2021-04-24 ENCOUNTER — Other Ambulatory Visit: Payer: Self-pay

## 2021-04-24 VITALS — BP 128/62 | HR 90 | Resp 18 | Ht 63.0 in | Wt 133.4 lb

## 2021-04-24 DIAGNOSIS — A419 Sepsis, unspecified organism: Secondary | ICD-10-CM | POA: Diagnosis not present

## 2021-04-24 DIAGNOSIS — Z5320 Procedure and treatment not carried out because of patient's decision for unspecified reasons: Secondary | ICD-10-CM

## 2021-04-24 DIAGNOSIS — K529 Noninfective gastroenteritis and colitis, unspecified: Secondary | ICD-10-CM | POA: Diagnosis not present

## 2021-04-24 DIAGNOSIS — K5792 Diverticulitis of intestine, part unspecified, without perforation or abscess without bleeding: Secondary | ICD-10-CM | POA: Diagnosis not present

## 2021-04-24 DIAGNOSIS — Z09 Encounter for follow-up examination after completed treatment for conditions other than malignant neoplasm: Secondary | ICD-10-CM

## 2021-04-24 DIAGNOSIS — R600 Localized edema: Secondary | ICD-10-CM

## 2021-04-24 DIAGNOSIS — R6520 Severe sepsis without septic shock: Secondary | ICD-10-CM | POA: Diagnosis not present

## 2021-04-24 MED ORDER — FUROSEMIDE 20 MG PO TABS: 20.0000 mg | ORAL_TABLET | Freq: Every day | ORAL | 0 refills | Status: DC | PRN

## 2021-04-24 NOTE — Patient Instructions (Addendum)
Take Lasix 20 mg once daily as needed for bilateral ankle edema Elevate legs as needed Follow-up as needed   Peripheral Edema Peripheral edema is swelling that is caused by a buildup of fluid. Peripheral edema most often affects the lower legs, ankles, and feet. It can also develop in the arms, hands, and face. The area of the body that has peripheral edema will look swollen. It may also feel heavy or warm. Your clothes may start to feel tight. Pressing on the area may make a temporary dent in your skin. You may not be able to move your swollen arm or leg as much as usual. There are many causes of peripheral edema. It can happen because of a complication of other conditions such as congestive heart failure, kidney disease, or a problem with your blood circulation. It also can be a side effect of certain medicines or because of an infection. It often happens to women during pregnancy. Sometimes, the cause is not known. Follow these instructions at home: Managing pain, stiffness, and swelling  Raise (elevate) your legs while you are sitting or lying down. Move around often to prevent stiffness and to lessen swelling. Do not sit or stand for long periods of time. Wear support stockings as told by your health care provider. Medicines Take over-the-counter and prescription medicines only as told by your health care provider. Your health care provider may prescribe medicine to help your body get rid of excess water (diuretic). General instructions Pay attention to any changes in your symptoms. Follow instructions from your health care provider about limiting salt (sodium) in your diet. Sometimes, eating less salt may reduce swelling. Moisturize skin daily to help prevent skin from cracking and draining. Keep all follow-up visits as told by your health care provider. This is important. Contact a health care provider if you have: A fever. Edema that starts suddenly or is getting worse, especially if  you are pregnant or have a medical condition. Swelling in only one leg. Increased swelling, redness, or pain in one or both of your legs. Drainage or sores at the area where you have edema. Get help right away if you: Develop shortness of breath, especially when you are lying down. Have pain in your chest or abdomen. Feel weak. Feel faint. Summary Peripheral edema is swelling that is caused by a buildup of fluid. Peripheral edema most often affects the lower legs, ankles, and feet. Move around often to prevent stiffness and to lessen swelling. Do not sit or stand for long periods of time. Pay attention to any changes in your symptoms. Contact a health care provider if you have edema that starts suddenly or is getting worse, especially if you are pregnant or have a medical condition. Get help right away if you develop shortness of breath, especially when lying down. This information is not intended to replace advice given to you by your health care provider. Make sure you discuss any questions you have with your health care provider. Document Revised: 03/16/2018 Document Reviewed: 03/16/2018 Elsevier Patient Education  2022 [employer].

## 2021-04-24 NOTE — Progress Notes (Signed)
Acute Office Visit  Subjective:    Patient ID: Margaret Cockerill, female    DOB: Sep 03, 1929, 87 y.o.   MRN: [id number]  Chief Complaint  Patient presents with   Hospitalization Follow-up    HPI  Follow up Hospitalization  Patient was admitted to [facility] on 04/16/21 and discharged on 04/19/21. She was treated for Sepsis secondary to UTI, sigmoid colitis/diverticulitis, two small bilateral pleural effusions Treatment for this included Flagyl, Rocephin, Azithromycin, and Tylenol. She reports good compliance with treatment.Continues to take Keflex and Flagyl po as prescribed She reports this condition is improved.   Shawnita states she is experiencing bilateral pedal edema. Onset has was two day ago. Treatment has included elevating her legs. She tells me that she was given IVF during hospitalization that caused fluid overload. She has a history of stage 4 CKD. States she was given IV Lasix during hospitalization. She has declined labs today. States she was "stuck" multiple times while hospitalized. She tells me she has a follow-up appt with PCP next week on 04/30/21 and has agreed to lab draw that day.   Past Medical History:  Diagnosis Date   Acute gangrenous appendicitis with perforation and peritonitis 11/22/2011   Afib ([place]) 2008   CVA (cerebral infarction) 2008   no residual deficits   Osteoporosis     Past Surgical History:  Procedure Laterality Date   INSERT / REPLACE / REMOVE PACEMAKER     LAPAROSCOPIC APPENDECTOMY  11/22/2011   Procedure: APPENDECTOMY LAPAROSCOPIC;  Surgeon: Stark Klein, MD;  Location: [place];  Service: General;  Laterality: N/A;   PACEMAKER INSERTION     PPM GENERATOR CHANGEOUT N/A 08/16/2017   Procedure: PPM GENERATOR CHANGEOUT;  Surgeon: Evans Lance, MD;  Location: [place] [facility];  Service: Cardiovascular;  Laterality: N/A;   RIGHT/LEFT HEART CATH AND CORONARY ANGIOGRAPHY N/A 02/01/2017   Procedure: Right/Left Heart Cath and Coronary  Angiography;  Surgeon: Troy Sine, MD;  Location: [place] [facility];  Service: Cardiovascular;  Laterality: N/A;   TONSILLECTOMY AND ADENOIDECTOMY  1935    Family History  Problem Relation Age of Onset   Coronary artery disease Mother    Heart attack Mother    Atrial fibrillation Father    Lung cancer Father    Alcohol abuse Brother    Failure to thrive Brother     Social History   Socioeconomic History   Marital status: Widowed    Spouse name: Not on file   Number of children: 3   Years of education: Not on file   Highest education level: Not on file  Occupational History   Occupation: Retired  Tobacco Use   Smoking status: Former    Types: Cigarettes    Quit date: 1975    Years since quitting: 47.8   Smokeless tobacco: Never  Vaping Use   Vaping Use: Never used  Substance and Sexual Activity   Alcohol use: No   Drug use: No   Sexual activity: Not Currently  Other Topics Concern   Not on file  Social History Narrative   Lives with dtr, moved to [place] from [place] in 2014, widowed early 2013 (91 years married)   Social Determinants of Radio broadcast assistant Strain: Not on file  Food Insecurity: Not on file  Transportation Needs: Not on file  Physical Activity: Not on file  Stress: Not on file  Social Connections: Not on file  Intimate Partner Violence: Not on file  Outpatient Medications Prior to Visit  Medication Sig Dispense Refill   acetaminophen (TYLENOL) 500 MG tablet Take 1,000 mg by mouth every 6 (six) hours as needed for mild pain.     amiodarone (PACERONE) 200 MG tablet TAKE 1 TABLET ON MONDAY, TUESDAY, WEDNESDAY, THURSDAY, FRIDAY, AND SATURDAY. DO NOT TAKE ANY ON SUNDAY. (Patient taking differently: Take 100 mg by mouth See admin instructions. 200mg  daily every day except for Sunday's) 78 tablet 3   Ascorbic Acid (VITAMIN C PO) Take 1 tablet by mouth daily.     cephALEXin (KEFLEX) 500 MG capsule Take 1 capsule (500 mg total) by  mouth 2 (two) times daily for 7 days. 14 capsule 0   digoxin (LANOXIN) 0.125 MG tablet Take 0.5 tablets (0.0625 mg total) by mouth daily. Please keep upcoming appt in July 2022 with Dr. Lovena Le before anymore refills. Thank you (Patient taking differently: Take 0.0625 mg by mouth every other day.) 45 tablet 0   estradiol (ESTRACE) 0.1 MG/GM vaginal cream Place 1 Applicatorful vaginally every other day.     metoprolol tartrate (LOPRESSOR) 100 MG tablet Take 1 tablet (100 mg total) by mouth 2 (two) times daily. Please make overdue appt with Dr. Lovena Le before anymore refills. Thank you 2nd attempt (Patient taking differently: Take 100 mg by mouth 2 (two) times daily.) 30 tablet 0   metroNIDAZOLE (FLAGYL) 500 MG tablet Take 1 tablet (500 mg total) by mouth 2 (two) times daily for 7 days. 14 tablet 0   Rivaroxaban (XARELTO) 15 MG TABS tablet TAKE 1 TABLET BY MOUTH DAILY WITH SUPPER (Patient taking differently: Take 15 mg by mouth daily with supper.) 90 tablet 0   sodium zirconium cyclosilicate (LOKELMA) 10 g PACK packet Take 10 g by mouth 2 (two) times a week. Monday's and Thursday's     No facility-administered medications prior to visit.    No Known Allergies  Review of Systems  Constitutional:  Positive for appetite change. Negative for fatigue and unexpected weight change.  HENT:  Negative for congestion, ear pain, rhinorrhea, sinus pressure, sinus pain and tinnitus.   Eyes:  Negative for pain.  Respiratory:  Negative for cough and shortness of breath.   Cardiovascular:  Positive for leg swelling. Negative for chest pain and palpitations.  Gastrointestinal:  Negative for abdominal pain, constipation, diarrhea, nausea and vomiting.  Endocrine: Negative for cold intolerance, heat intolerance, polydipsia, polyphagia and polyuria.  Genitourinary:  Negative for dysuria, frequency and hematuria.  Musculoskeletal:  Negative for arthralgias, back pain, joint swelling and myalgias.  Skin:  Negative for  rash.  Allergic/Immunologic: Negative for environmental allergies.  Neurological:  Positive for weakness. Negative for dizziness and headaches.  Hematological:  Negative for adenopathy.  Psychiatric/Behavioral:  Negative for decreased concentration and sleep disturbance. The patient is not nervous/anxious.       Objective:    Physical Exam Vitals reviewed.  Cardiovascular:     Rate and Rhythm: Normal rate. Rhythm irregular.     Pulses: Normal pulses.     Heart sounds: Normal heart sounds.  Pulmonary:     Effort: Pulmonary effort is normal.  Abdominal:     General: Bowel sounds are normal.     Palpations: Abdomen is soft.  Skin:    General: Skin is warm and dry.     Capillary Refill: Capillary refill takes less than 2 seconds.     Findings: Bruising (bilateral upper extremities) present.  Neurological:     General: No focal deficit present.  Mental Status: She is alert and oriented to person, place, and time.  Psychiatric:        Mood and Affect: Mood normal.        Behavior: Behavior normal.    BP 128/62   Pulse 90   Resp 18   Ht $R'5\' 3"'hd$  (1.6 m)   Wt 133 lb 6.4 oz (60.5 kg)   SpO2 99%   BMI 23.63 kg/m  Wt Readings from Last 3 Encounters:  04/24/21 133 lb 6.4 oz (60.5 kg)  04/19/21 130 lb 15.3 oz (59.4 kg)  03/05/21 130 lb (59 kg)    Health Maintenance Due  Topic Date Due   Zoster Vaccines- Shingrix (1 of 2) Never done   Pneumonia Vaccine 66+ Years old (2 - PCV) 07/07/2007   COVID-19 Vaccine (3 - Booster for Moderna series) 05/11/2020       Lab Results  Component Value Date   TSH 2.190 03/05/2017   Lab Results  Component Value Date   WBC 8.9 04/19/2021   HGB 10.2 (L) 04/19/2021   HCT 32.1 (L) 04/19/2021   MCV 94.7 04/19/2021   PLT 157 04/19/2021   Lab Results  Component Value Date   NA 137 04/19/2021   K 3.9 04/19/2021   CO2 21 (L) 04/19/2021   GLUCOSE 102 (H) 04/19/2021   BUN 25 (H) 04/19/2021   CREATININE 1.59 (H) 04/19/2021   BILITOT 1.4  (H) 04/17/2021   ALKPHOS 57 04/17/2021   AST 20 04/17/2021   ALT 15 04/17/2021   PROT 6.9 04/17/2021   ALBUMIN 3.1 (L) 04/17/2021   CALCIUM 8.6 (L) 04/19/2021   ANIONGAP 9 04/19/2021   EGFR 28 (L) 02/25/2021   GFR 35.27 (L) 04/25/2014   Lab Results  Component Value Date   CHOL 178 01/15/2021   Lab Results  Component Value Date   HDL 47 01/15/2021   Lab Results  Component Value Date   LDLCALC 105 (H) 01/15/2021   Lab Results  Component Value Date   TRIG 147 01/15/2021   Lab Results  Component Value Date   CHOLHDL 3.8 01/15/2021        Assessment & Plan:   1. Sepsis with acute organ dysfunction without septic shock, due to unspecified organism, unspecified type ([place]) -continue Keflex and Flagyl as prescribed  2. Colitis -continue Keflex and Flagyl as prescribed  3. Diverticulitis -continue Keflex and Flagyl as prescribed  4. Bilateral lower extremity edema -Lasix 20 mg po PRN for pedal edema  5. Hospital discharge follow-up  6. Procedure refused  -pt declined CBC and CMP today in-office due to multiple blood draws during hospitalization. States bilateral upper extremities are bruised and sore. Agreed to CBC and CMP during chronic visit with PCP on 04/30/21    I have spent 45 minutes with the care of this patient including physical exam, prescription medication management, and thorough electronic medical record review.   Follow-up: 04/30/21  I, Rip Harbour, NP, have reviewed all documentation for this visit. The documentation on 04/24/21 for the exam, diagnosis, procedures, and orders are all accurate and complete.   Signed, Rip Harbour, NP 04/24/21 at 8:14 PM

## 2021-04-30 ENCOUNTER — Other Ambulatory Visit: Payer: Self-pay

## 2021-04-30 ENCOUNTER — Encounter: Payer: Self-pay | Admitting: Legal Medicine

## 2021-04-30 ENCOUNTER — Ambulatory Visit (INDEPENDENT_AMBULATORY_CARE_PROVIDER_SITE_OTHER): Payer: Medicare PPO | Admitting: Legal Medicine

## 2021-04-30 VITALS — BP 100/60 | HR 80 | Temp 97.8°F | Resp 16 | Ht 63.0 in | Wt 130.0 lb

## 2021-04-30 DIAGNOSIS — I700 Atherosclerosis of aorta: Secondary | ICD-10-CM

## 2021-04-30 DIAGNOSIS — D6869 Other thrombophilia: Secondary | ICD-10-CM

## 2021-04-30 DIAGNOSIS — R300 Dysuria: Secondary | ICD-10-CM | POA: Diagnosis not present

## 2021-04-30 DIAGNOSIS — M8000XD Age-related osteoporosis with current pathological fracture, unspecified site, subsequent encounter for fracture with routine healing: Secondary | ICD-10-CM | POA: Diagnosis not present

## 2021-04-30 DIAGNOSIS — N184 Chronic kidney disease, stage 4 (severe): Secondary | ICD-10-CM

## 2021-04-30 DIAGNOSIS — M810 Age-related osteoporosis without current pathological fracture: Secondary | ICD-10-CM | POA: Diagnosis not present

## 2021-04-30 DIAGNOSIS — I5022 Chronic systolic (congestive) heart failure: Secondary | ICD-10-CM

## 2021-04-30 DIAGNOSIS — I482 Chronic atrial fibrillation, unspecified: Secondary | ICD-10-CM

## 2021-04-30 MED ORDER — PRAVASTATIN SODIUM 20 MG PO TABS: 20.0000 mg | ORAL_TABLET | Freq: Every day | ORAL | 2 refills | Status: DC

## 2021-04-30 NOTE — Progress Notes (Signed)
Established Patient Office Visit  Subjective:  Patient ID: Tracy Vasquez, female    DOB: 10-09-1929  Age: 87 y.o. MRN: [id number]  CC:  Chief Complaint  Patient presents with   Atrial Fibrillation   Hyperlipidemia   Congestive Heart Failure   Leg Swelling    HPI Tracy Vasquez presents for chronic visit.  She was admitted lastly for urosepsis.  Patient has a diagnosis of chronc atrial fibrillation.   Patient is on eliquis and has cotnrolled ventricular response.  Patient is CV stable.   Patient has aortic atherosclerosis found on MRI  Patient has stage 4 renal disease. Seeing [facility]  Patient presents with HFrEF  that is stable. Diagnosis made 2018.  The course of the disease is stable.  Current medicines include digoxin, furosemide, amiodarone.. Patient follows a low cholesterol diet and maintains a weight diary.  Patient is on low salt, low cholesterol diet and avoids alcohol.  Patient denies adverse effects of medicines. Patient is monitoring weight and has n change of weight.  Patient is having non pedal edema, no PND and no PND.  Patient is continuing to see cardiology.       Past Medical History:  Diagnosis Date   Acute gangrenous appendicitis with perforation and peritonitis 11/22/2011   Afib ([place]) 2008   CVA (cerebral infarction) 2008   no residual deficits   Osteoporosis     Past Surgical History:  Procedure Laterality Date   INSERT / REPLACE / REMOVE PACEMAKER     LAPAROSCOPIC APPENDECTOMY  11/22/2011   Procedure: APPENDECTOMY LAPAROSCOPIC;  Surgeon: Stark Klein, MD;  Location: [place];  Service: General;  Laterality: N/A;   PACEMAKER INSERTION     PPM GENERATOR CHANGEOUT N/A 08/16/2017   Procedure: PPM GENERATOR CHANGEOUT;  Surgeon: Evans Lance, MD;  Location: [place] [facility];  Service: Cardiovascular;  Laterality: N/A;   RIGHT/LEFT HEART CATH AND CORONARY ANGIOGRAPHY N/A 02/01/2017   Procedure: Right/Left Heart Cath and Coronary Angiography;   Surgeon: Troy Sine, MD;  Location: [place] [facility];  Service: Cardiovascular;  Laterality: N/A;   TONSILLECTOMY AND ADENOIDECTOMY  1935    Family History  Problem Relation Age of Onset   Coronary artery disease Mother    Heart attack Mother    Atrial fibrillation Father    Lung cancer Father    Alcohol abuse Brother    Failure to thrive Brother     Social History   Socioeconomic History   Marital status: Widowed    Spouse name: Not on file   Number of children: 3   Years of education: Not on file   Highest education level: Not on file  Occupational History   Occupation: Retired  Tobacco Use   Smoking status: Former    Types: Cigarettes    Quit date: 1975    Years since quitting: 47.8   Smokeless tobacco: Never  Vaping Use   Vaping Use: Never used  Substance and Sexual Activity   Alcohol use: No   Drug use: No   Sexual activity: Not Currently  Other Topics Concern   Not on file  Social History Narrative   Lives with dtr, moved to [place] from [place] in 2014, widowed early 2013 (69 years married)   Social Determinants of Radio broadcast assistant Strain: Not on file  Food Insecurity: Not on file  Transportation Needs: Not on file  Physical Activity: Not on file  Stress: Not on file  Social Connections: Not  on file  Intimate Partner Violence: Not on file    Outpatient Medications Prior to Visit  Medication Sig Dispense Refill   acetaminophen (TYLENOL) 500 MG tablet Take 1,000 mg by mouth every 6 (six) hours as needed for mild pain.     amiodarone (PACERONE) 200 MG tablet TAKE 1 TABLET ON MONDAY, TUESDAY, WEDNESDAY, THURSDAY, FRIDAY, AND SATURDAY. DO NOT TAKE ANY ON SUNDAY. (Patient taking differently: Take 100 mg by mouth See admin instructions. 200mg  daily every day except for Sunday's) 78 tablet 3   Ascorbic Acid (VITAMIN C PO) Take 1 tablet by mouth daily.     digoxin (LANOXIN) 0.125 MG tablet Take 0.5 tablets (0.0625 mg total) by mouth daily.  Please keep upcoming appt in July 2022 with Dr. Lovena Le before anymore refills. Thank you (Patient taking differently: Take 0.0625 mg by mouth every other day.) 45 tablet 0   estradiol (ESTRACE) 0.1 MG/GM vaginal cream Place 1 Applicatorful vaginally every other day.     furosemide (LASIX) 20 MG tablet TAKE 1 TABLET(20 MG) BY MOUTH DAILY AS NEEDED FOR EDEMA 90 tablet 0   metoprolol tartrate (LOPRESSOR) 100 MG tablet Take 1 tablet (100 mg total) by mouth 2 (two) times daily. Please make overdue appt with Dr. Lovena Le before anymore refills. Thank you 2nd attempt (Patient taking differently: Take 100 mg by mouth 2 (two) times daily.) 30 tablet 0   Rivaroxaban (XARELTO) 15 MG TABS tablet TAKE 1 TABLET BY MOUTH DAILY WITH SUPPER (Patient taking differently: Take 15 mg by mouth daily with supper.) 90 tablet 0   sodium zirconium cyclosilicate (LOKELMA) 10 g PACK packet Take 10 g by mouth 2 (two) times a week. Monday's and Thursday's     No facility-administered medications prior to visit.    No Known Allergies  ROS Review of Systems  Constitutional:  Negative for activity change and appetite change.  HENT:  Negative for congestion.   Eyes:  Negative for visual disturbance.  Respiratory:  Negative for chest tightness and shortness of breath.   Cardiovascular:  Negative for chest pain, palpitations and leg swelling.  Gastrointestinal:  Negative for abdominal distention and abdominal pain.  Endocrine: Negative for polyuria.  Genitourinary:  Negative for difficulty urinating and dysuria.  Musculoskeletal:  Negative for arthralgias and back pain.  Neurological: Negative.   Hematological: Negative.   Psychiatric/Behavioral: Negative.       Objective:    Physical Exam Vitals reviewed.  Constitutional:      General: She is not in acute distress.    Appearance: Normal appearance.  HENT:     Head: Normocephalic.     Right Ear: Tympanic membrane, ear canal and external ear normal.     Left Ear:  Tympanic membrane, ear canal and external ear normal.     Nose: Nose normal.     Mouth/Throat:     Mouth: Mucous membranes are moist.     Pharynx: Oropharynx is clear.  Eyes:     Extraocular Movements: Extraocular movements intact.     Conjunctiva/sclera: Conjunctivae normal.     Pupils: Pupils are equal, round, and reactive to light.  Cardiovascular:     Rate and Rhythm: Normal rate.     Pulses: Normal pulses.     Heart sounds: No murmur heard.   No gallop.  Pulmonary:     Effort: Pulmonary effort is normal. No respiratory distress.     Breath sounds: Normal breath sounds. No wheezing.  Abdominal:     General: Abdomen is flat.  Bowel sounds are normal. There is no distension.     Palpations: Abdomen is soft.     Tenderness: There is no abdominal tenderness.  Musculoskeletal:     Cervical back: Normal range of motion.     Comments: Muscle wasting  Skin:    General: Skin is warm.     Capillary Refill: Capillary refill takes less than 2 seconds.  Neurological:     General: No focal deficit present.     Mental Status: She is alert and oriented to person, place, and time. Mental status is at baseline.     Motor: Weakness present.  Psychiatric:        Mood and Affect: Mood normal.    BP 100/60   Pulse 80   Temp 97.8 F (36.6 C)   Resp 16   Ht $R'5\' 3"'Cl$  (1.6 m)   Wt 130 lb (59 kg)   SpO2 97%   BMI 23.03 kg/m  Wt Readings from Last 3 Encounters:  04/30/21 130 lb (59 kg)  04/24/21 133 lb 6.4 oz (60.5 kg)  04/19/21 130 lb 15.3 oz (59.4 kg)     Health Maintenance Due  Topic Date Due   Zoster Vaccines- Shingrix (1 of 2) Never done   Pneumonia Vaccine 14+ Years old (2 - PCV) 07/07/2007   COVID-19 Vaccine (3 - Booster for Moderna series) 05/11/2020    There are no preventive care reminders to display for this patient.  Lab Results  Component Value Date   TSH 2.190 03/05/2017   Lab Results  Component Value Date   WBC 8.9 04/19/2021   HGB 10.2 (L) 04/19/2021   HCT  32.1 (L) 04/19/2021   MCV 94.7 04/19/2021   PLT 157 04/19/2021   Lab Results  Component Value Date   NA 137 04/19/2021   K 3.9 04/19/2021   CO2 21 (L) 04/19/2021   GLUCOSE 102 (H) 04/19/2021   BUN 25 (H) 04/19/2021   CREATININE 1.59 (H) 04/19/2021   BILITOT 1.4 (H) 04/17/2021   ALKPHOS 57 04/17/2021   AST 20 04/17/2021   ALT 15 04/17/2021   PROT 6.9 04/17/2021   ALBUMIN 3.1 (L) 04/17/2021   CALCIUM 8.6 (L) 04/19/2021   ANIONGAP 9 04/19/2021   EGFR 28 (L) 02/25/2021   GFR 35.27 (L) 04/25/2014   Lab Results  Component Value Date   CHOL 178 01/15/2021   Lab Results  Component Value Date   HDL 47 01/15/2021   Lab Results  Component Value Date   LDLCALC 105 (H) 01/15/2021   Lab Results  Component Value Date   TRIG 147 01/15/2021   Lab Results  Component Value Date   CHOLHDL 3.8 01/15/2021   No results found for: HGBA1C    Assessment & Plan:   Problem List Items Addressed This Visit       Cardiovascular and Mediastinum   Aortic atherosclerosis (HCC) (Chronic) Patient has aortic atherosclerosis on CT, start on pravastatin    Chronic atrial fibrillation ([place]) - Primary Patient has a diagnosis of chronic atrial fibrillation.   Patient is on no anticoagulant and has controlled ventricular response.  Patient is CV stable.     Chronic systolic CHF (congestive heart failure) (HCC) An individualized care plan was established and reinforced.  The patient's disease status was assessed using clinical finding son exam today, labs, and/or other diagnostic testing such as x-rays, to determine the patient's success in meeting treatmentgoalsbased on disease-based guidelines and found to beimproving. But not at goal yet. Medications  prescriptions no changes Laboratory tests ordered to be performed today include routine labs. RECOMMENDATIONS: given include see crdiology.  Call physician is patient gains 3 lbs in one day or 5 lbs for one week.  Call for progressive PND, orthopnea  or increased pedal edema.      Musculoskeletal and Integument   Age-related osteoporosis without current pathological fracture AN INDIVIDUAL CARE PLAN for osteoporosis was established and reinforced today.  The patient's status was assessed using clinical findings on exam, labs, and other diagnostic testing. Patient's success at meeting treatment goals based on disease specific evidence-bassed guidelines and found to be in good control. RECOMMENDATIONS include maintaining present medicines and treatment.    Other Visit Diagnoses     Dysuria     Urinalysis normal         Follow-up: Return in about 3 months (around 07/31/2021) for fasting.    Reinaldo Meeker, MD

## 2021-04-30 NOTE — Progress Notes (Signed)
Subjective:  Patient ID: Tracy Vasquez, female    DOB: January 12, 1930  Age: 87 y.o. MRN: [id number]  Chief Complaint  Patient presents with   Atrial Fibrillation   Hyperlipidemia   Congestive Heart Failure   Leg Swelling     HPI   Current Outpatient Medications on File Prior to Visit  Medication Sig Dispense Refill   acetaminophen (TYLENOL) 500 MG tablet Take 1,000 mg by mouth every 6 (six) hours as needed for mild pain.     amiodarone (PACERONE) 200 MG tablet TAKE 1 TABLET ON MONDAY, TUESDAY, WEDNESDAY, THURSDAY, FRIDAY, AND SATURDAY. DO NOT TAKE ANY ON SUNDAY. (Patient taking differently: Take 100 mg by mouth See admin instructions. 200mg  daily every day except for Sunday's) 78 tablet 3   Ascorbic Acid (VITAMIN C PO) Take 1 tablet by mouth daily.     digoxin (LANOXIN) 0.125 MG tablet Take 0.5 tablets (0.0625 mg total) by mouth daily. Please keep upcoming appt in July 2022 with Dr. Lovena Le before anymore refills. Thank you (Patient taking differently: Take 0.0625 mg by mouth every other day.) 45 tablet 0   estradiol (ESTRACE) 0.1 MG/GM vaginal cream Place 1 Applicatorful vaginally every other day.     furosemide (LASIX) 20 MG tablet TAKE 1 TABLET(20 MG) BY MOUTH DAILY AS NEEDED FOR EDEMA 90 tablet 0   metoprolol tartrate (LOPRESSOR) 100 MG tablet Take 1 tablet (100 mg total) by mouth 2 (two) times daily. Please make overdue appt with Dr. Lovena Le before anymore refills. Thank you 2nd attempt (Patient taking differently: Take 100 mg by mouth 2 (two) times daily.) 30 tablet 0   Rivaroxaban (XARELTO) 15 MG TABS tablet TAKE 1 TABLET BY MOUTH DAILY WITH SUPPER (Patient taking differently: Take 15 mg by mouth daily with supper.) 90 tablet 0   sodium zirconium cyclosilicate (LOKELMA) 10 g PACK packet Take 10 g by mouth 2 (two) times a week. Monday's and Thursday's     No current facility-administered medications on file prior to visit.   Past Medical History:  Diagnosis Date   Acute gangrenous  appendicitis with perforation and peritonitis 11/22/2011   Afib ([place]) 2008   CVA (cerebral infarction) 2008   no residual deficits   Osteoporosis    Past Surgical History:  Procedure Laterality Date   INSERT / REPLACE / REMOVE PACEMAKER     LAPAROSCOPIC APPENDECTOMY  11/22/2011   Procedure: APPENDECTOMY LAPAROSCOPIC;  Surgeon: Stark Klein, MD;  Location: [facility];  Service: General;  Laterality: N/A;   PACEMAKER INSERTION     PPM GENERATOR CHANGEOUT N/A 08/16/2017   Procedure: PPM GENERATOR CHANGEOUT;  Surgeon: Evans Lance, MD;  Location: [facility];  Service: Cardiovascular;  Laterality: N/A;   RIGHT/LEFT HEART CATH AND CORONARY ANGIOGRAPHY N/A 02/01/2017   Procedure: Right/Left Heart Cath and Coronary Angiography;  Surgeon: Troy Sine, MD;  Location: [facility];  Service: Cardiovascular;  Laterality: N/A;   TONSILLECTOMY AND ADENOIDECTOMY  1935    Family History  Problem Relation Age of Onset   Coronary artery disease Mother    Heart attack Mother    Atrial fibrillation Father    Lung cancer Father    Alcohol abuse Brother    Failure to thrive Brother    Social History   Socioeconomic History   Marital status: Widowed    Spouse name: Not on file   Number of children: 3   Years of education: Not on file   Highest education level: Not on file  Occupational History   Occupation: Retired  Tobacco Use   Smoking status: Former    Types: Cigarettes    Quit date: 1975    Years since quitting: 47.8   Smokeless tobacco: Never  Vaping Use   Vaping Use: Never used  Substance and Sexual Activity   Alcohol use: No   Drug use: No   Sexual activity: Not Currently  Other Topics Concern   Not on file  Social History Narrative   Lives with dtr, moved to [place] from [place] in 2014, widowed early 2013 (82 years married)   Social Determinants of Radio broadcast assistant Strain: Not on file  Food Insecurity: Not on file  Transportation Needs: Not on  file  Physical Activity: Not on file  Stress: Not on file  Social Connections: Not on file    Review of Systems  Constitutional:  Negative for chills, fatigue and fever.  HENT:  Negative for congestion, ear pain and sore throat.   Respiratory:  Negative for cough and shortness of breath.   Cardiovascular:  Positive for leg swelling. Negative for chest pain and palpitations.  Gastrointestinal:  Negative for abdominal pain, constipation, diarrhea, nausea and vomiting.  Endocrine: Negative for polydipsia, polyphagia and polyuria.  Genitourinary:  Negative for difficulty urinating and dysuria.  Musculoskeletal:  Negative for arthralgias, back pain and myalgias.  Skin:  Negative for rash.  Neurological:  Negative for headaches.  Psychiatric/Behavioral:  Negative for dysphoric mood. The patient is not nervous/anxious.     Objective:  BP 100/60   Pulse 80   Temp 97.8 F (36.6 C)   Resp 16   Ht 5\' 3"  (1.6 m)   Wt 130 lb (59 kg)   SpO2 97%   BMI 23.03 kg/m   BP/Weight 04/30/2021 04/24/2021 58/85/0277  Systolic BP 412 878 676  Diastolic BP 60 62 74  Wt. (Lbs) 130 133.4 130.95  BMI 23.03 23.63 23.2    Physical Exam  Diabetic Foot Exam - Simple   No data filed      Lab Results  Component Value Date   WBC 8.9 04/19/2021   HGB 10.2 (L) 04/19/2021   HCT 32.1 (L) 04/19/2021   PLT 157 04/19/2021   GLUCOSE 102 (H) 04/19/2021   CHOL 178 01/15/2021   TRIG 147 01/15/2021   HDL 47 01/15/2021   LDLCALC 105 (H) 01/15/2021   ALT 15 04/17/2021   AST 20 04/17/2021   NA 137 04/19/2021   K 3.9 04/19/2021   CL 107 04/19/2021   CREATININE 1.59 (H) 04/19/2021   BUN 25 (H) 04/19/2021   CO2 21 (L) 04/19/2021   TSH 2.190 03/05/2017   INR 1.15 01/31/2017      Assessment & Plan:   Problem List Items Addressed This Visit       Cardiovascular and Mediastinum   Aortic atherosclerosis (HCC) (Chronic)   Chronic atrial fibrillation (HCC) - Primary   Chronic systolic CHF  (congestive heart failure) (HCC)     Musculoskeletal and Integument   Age-related osteoporosis without current pathological fracture   Other Visit Diagnoses     Dysuria         .  No orders of the defined types were placed in this encounter.   No orders of the defined types were placed in this encounter.    Follow-up: No follow-ups on file.  An After Visit Summary was printed and given to the patient.  Reinaldo Meeker, MD [facility] [phone]

## 2021-05-01 LAB — COMPREHENSIVE METABOLIC PANEL
ALT: 7 IU/L (ref 0–32)
AST: 16 IU/L (ref 0–40)
Albumin/Globulin Ratio: 1.4 (ref 1.2–2.2)
Albumin: 4.2 g/dL (ref 3.6–4.6)
Alkaline Phosphatase: 68 IU/L (ref 44–121)
BUN/Creatinine Ratio: 15 (ref 12–28)
BUN: 23 mg/dL (ref 8–27)
Bilirubin Total: 0.4 mg/dL (ref 0.0–1.2)
CO2: 23 mmol/L (ref 20–29)
Calcium: 9.3 mg/dL (ref 8.7–10.3)
Chloride: 101 mmol/L (ref 96–106)
Creatinine, Ser: 1.5 mg/dL — ABNORMAL HIGH (ref 0.57–1.00)
Globulin, Total: 3.1 g/dL (ref 1.5–4.5)
Glucose: 87 mg/dL (ref 70–99)
Potassium: 4.7 mmol/L (ref 3.5–5.2)
Sodium: 141 mmol/L (ref 134–144)
Total Protein: 7.3 g/dL (ref 6.0–8.5)
eGFR: 33 mL/min/{1.73_m2} — ABNORMAL LOW (ref >59)

## 2021-05-01 LAB — CBC WITH DIFFERENTIAL/PLATELET
Basophils Absolute: 0.1 10*3/uL (ref 0.0–0.2)
Basos: 1 %
EOS (ABSOLUTE): 0 10*3/uL (ref 0.0–0.4)
Eos: 1 %
Hematocrit: 37.6 % (ref 34.0–46.6)
Hemoglobin: 11.9 g/dL (ref 11.1–15.9)
Immature Grans (Abs): 0 10*3/uL (ref 0.0–0.1)
Immature Granulocytes: 0 %
Lymphocytes Absolute: 2.3 10*3/uL (ref 0.7–3.1)
Lymphs: 28 %
MCH: 29.4 pg (ref 26.6–33.0)
MCHC: 31.6 g/dL (ref 31.5–35.7)
MCV: 93 fL (ref 79–97)
Monocytes Absolute: 0.7 10*3/uL (ref 0.1–0.9)
Monocytes: 9 %
Neutrophils Absolute: 4.9 10*3/uL (ref 1.4–7.0)
Neutrophils: 61 %
Platelets: 309 10*3/uL (ref 150–450)
RBC: 4.05 x10E6/uL (ref 3.77–5.28)
RDW: 13.6 % (ref 11.7–15.4)
WBC: 8.1 10*3/uL (ref 3.4–10.8)

## 2021-05-01 LAB — LIPID PANEL
Chol/HDL Ratio: 2.7 ratio (ref 0.0–4.4)
Cholesterol, Total: 133 mg/dL (ref 100–199)
HDL: 49 mg/dL (ref >39)
LDL Chol Calc (NIH): 65 mg/dL (ref 0–99)
Triglycerides: 102 mg/dL (ref 0–149)
VLDL Cholesterol Cal: 19 mg/dL (ref 5–40)

## 2021-05-01 LAB — CARDIOVASCULAR RISK ASSESSMENT

## 2021-05-01 LAB — PRO B NATRIURETIC PEPTIDE: NT-Pro BNP: 4417 pg/mL — ABNORMAL HIGH (ref 0–738)

## 2021-05-01 NOTE — Progress Notes (Signed)
Cbc normal, kidney stage 4, liver tests normal, cholesterol normal, proBNP 4,417 still in some chf- increase furosemide to 2 pills a day. lp

## 2021-05-16 ENCOUNTER — Other Ambulatory Visit: Payer: Self-pay | Admitting: Internal Medicine

## 2021-05-22 ENCOUNTER — Ambulatory Visit (INDEPENDENT_AMBULATORY_CARE_PROVIDER_SITE_OTHER): Payer: Medicare PPO

## 2021-05-22 DIAGNOSIS — I495 Sick sinus syndrome: Secondary | ICD-10-CM | POA: Diagnosis not present

## 2021-05-22 LAB — CUP PACEART REMOTE DEVICE CHECK
Battery Remaining Longevity: 42 mo
Battery Remaining Percentage: 66 %
Brady Statistic RA Percent Paced: 0 %
Brady Statistic RV Percent Paced: 90 %
Date Time Interrogation Session: 20221117030100
Implantable Lead Implant Date: 20080827
Implantable Lead Implant Date: 20080827
Implantable Lead Location: 753859
Implantable Lead Location: 753860
Implantable Lead Model: 4135
Implantable Lead Model: 4136
Implantable Lead Serial Number: 28356762
Implantable Lead Serial Number: 28365470
Implantable Pulse Generator Implant Date: 20190211
Lead Channel Impedance Value: 495 Ohm
Lead Channel Impedance Value: 634 Ohm
Lead Channel Pacing Threshold Amplitude: 0.7 V
Lead Channel Pacing Threshold Pulse Width: 0.4 ms
Lead Channel Setting Pacing Amplitude: 2 V
Lead Channel Setting Pacing Amplitude: 2.4 V
Lead Channel Setting Pacing Pulse Width: 0.4 ms
Lead Channel Setting Sensing Sensitivity: 2.5 mV
Pulse Gen Serial Number: 396317

## 2021-06-02 NOTE — Progress Notes (Signed)
Remote pacemaker transmission.   

## 2021-06-11 ENCOUNTER — Other Ambulatory Visit: Payer: Self-pay | Admitting: Legal Medicine

## 2021-06-11 ENCOUNTER — Other Ambulatory Visit: Payer: Self-pay

## 2021-06-11 DIAGNOSIS — R600 Localized edema: Secondary | ICD-10-CM

## 2021-06-11 MED ORDER — FUROSEMIDE 20 MG PO TABS: ORAL_TABLET | ORAL | 2 refills | Status: DC

## 2021-06-11 MED ORDER — LORAZEPAM 0.5 MG PO TABS: 0.5000 mg | ORAL_TABLET | Freq: Every day | ORAL | 3 refills | Status: DC

## 2021-06-11 MED ORDER — FUROSEMIDE 20 MG PO TABS: 20.0000 mg | ORAL_TABLET | Freq: Two times a day (BID) | ORAL | 2 refills | Status: DC

## 2021-06-11 NOTE — Telephone Encounter (Signed)
Patient also requesting .5mg  of ativan taken once daily. States she has been having hard time sleeping. Not on current med list, was d/c'd in October.   Royce Macadamia, [place] 06/11/21 9:44 AM

## 2021-06-11 NOTE — Telephone Encounter (Signed)
Patient returning call. Pharmacy informed her script was too soon. Pt informed CMA she has been taking furosemide twice daily, this was increased 10/26 to two a day. She is requesting updated script. Also requesting ativan from previous message.    Royce Macadamia, [place] 06/11/21 11:01 AM

## 2021-06-12 ENCOUNTER — Other Ambulatory Visit: Payer: Self-pay

## 2021-06-12 DIAGNOSIS — R600 Localized edema: Secondary | ICD-10-CM

## 2021-06-12 MED ORDER — FUROSEMIDE 20 MG PO TABS: 20.0000 mg | ORAL_TABLET | Freq: Two times a day (BID) | ORAL | 0 refills | Status: DC

## 2021-06-12 MED ORDER — FUROSEMIDE 20 MG PO TABS: 20.0000 mg | ORAL_TABLET | Freq: Two times a day (BID) | ORAL | 2 refills | Status: DC

## 2021-06-16 ENCOUNTER — Other Ambulatory Visit: Payer: Self-pay

## 2021-06-16 DIAGNOSIS — I129 Hypertensive chronic kidney disease with stage 1 through stage 4 chronic kidney disease, or unspecified chronic kidney disease: Secondary | ICD-10-CM | POA: Diagnosis not present

## 2021-06-16 DIAGNOSIS — E875 Hyperkalemia: Secondary | ICD-10-CM | POA: Diagnosis not present

## 2021-06-16 DIAGNOSIS — N1832 Chronic kidney disease, stage 3b: Secondary | ICD-10-CM | POA: Diagnosis not present

## 2021-06-16 DIAGNOSIS — I5022 Chronic systolic (congestive) heart failure: Secondary | ICD-10-CM | POA: Diagnosis not present

## 2021-06-16 DIAGNOSIS — I480 Paroxysmal atrial fibrillation: Secondary | ICD-10-CM | POA: Diagnosis not present

## 2021-06-16 MED ORDER — LORAZEPAM 0.5 MG PO TABS: 0.5000 mg | ORAL_TABLET | Freq: Every day | ORAL | 3 refills | Status: DC

## 2021-06-16 NOTE — Telephone Encounter (Signed)
Patient having difficulties getting ativan filled through [facility]. She is requesting this be sent to [facility].   Royce Macadamia, [place] 06/16/21 10:24 AM

## 2021-06-26 ENCOUNTER — Other Ambulatory Visit: Payer: Self-pay | Admitting: Internal Medicine

## 2021-06-26 NOTE — Telephone Encounter (Signed)
Prescription refill request for Xarelto received.  Indication: Afib  Last office visit: 01/28/21 Tracy Vasquez)  Weight: 59kg Age: 87 Scr: 1.50 (04/30/21)  CrCl:  23.46ml/min  Appropriate dose and refill sent to requested pharmacy.

## 2021-07-09 ENCOUNTER — Other Ambulatory Visit: Payer: Medicare Other

## 2021-07-09 ENCOUNTER — Other Ambulatory Visit: Payer: Self-pay

## 2021-07-14 ENCOUNTER — Other Ambulatory Visit: Payer: Self-pay

## 2021-07-14 MED ORDER — LORAZEPAM 0.5 MG PO TABS: 0.5000 mg | ORAL_TABLET | Freq: Every day | ORAL | 3 refills | Status: DC

## 2021-07-25 ENCOUNTER — Other Ambulatory Visit: Payer: Self-pay | Admitting: Legal Medicine

## 2021-07-25 DIAGNOSIS — R600 Localized edema: Secondary | ICD-10-CM

## 2021-08-06 ENCOUNTER — Ambulatory Visit: Payer: Medicare Other | Admitting: Legal Medicine

## 2021-08-06 ENCOUNTER — Encounter: Payer: Self-pay | Admitting: Legal Medicine

## 2021-08-06 ENCOUNTER — Ambulatory Visit: Payer: Medicare PPO | Admitting: Legal Medicine

## 2021-08-06 VITALS — BP 140/70 | HR 70 | Temp 98.0°F | Resp 16 | Ht 63.0 in | Wt 124.0 lb

## 2021-08-06 DIAGNOSIS — M810 Age-related osteoporosis without current pathological fracture: Secondary | ICD-10-CM

## 2021-08-06 DIAGNOSIS — I8393 Asymptomatic varicose veins of bilateral lower extremities: Secondary | ICD-10-CM | POA: Diagnosis not present

## 2021-08-06 DIAGNOSIS — D6869 Other thrombophilia: Secondary | ICD-10-CM | POA: Diagnosis not present

## 2021-08-06 DIAGNOSIS — N3001 Acute cystitis with hematuria: Secondary | ICD-10-CM | POA: Diagnosis not present

## 2021-08-06 DIAGNOSIS — I482 Chronic atrial fibrillation, unspecified: Secondary | ICD-10-CM

## 2021-08-06 DIAGNOSIS — I700 Atherosclerosis of aorta: Secondary | ICD-10-CM

## 2021-08-06 DIAGNOSIS — I5022 Chronic systolic (congestive) heart failure: Secondary | ICD-10-CM

## 2021-08-06 DIAGNOSIS — N184 Chronic kidney disease, stage 4 (severe): Secondary | ICD-10-CM

## 2021-08-06 DIAGNOSIS — M8000XD Age-related osteoporosis with current pathological fracture, unspecified site, subsequent encounter for fracture with routine healing: Secondary | ICD-10-CM

## 2021-08-06 LAB — POCT URINALYSIS DIP (CLINITEK)
Bilirubin, UA: NEGATIVE
Glucose, UA: NEGATIVE mg/dL
Ketones, POC UA: NEGATIVE mg/dL
Nitrite, UA: NEGATIVE
POC PROTEIN,UA: 30 — AB
Spec Grav, UA: 1.015 (ref 1.010–1.025)
Urobilinogen, UA: 0.2 E.U./dL
pH, UA: 6 (ref 5.0–8.0)

## 2021-08-06 NOTE — Progress Notes (Signed)
Subjective:  Patient ID: Tracy Vasquez, female    DOB: [birth year]  Age: 88 y.o. MRN: [id number]  Chief Complaint  Patient presents with   Atrial Fibrillation   Congestive Heart Failure   Hyperlipidemia    HPI Afib: Patient is taking Xarelto 15 mg daily, amiodarone 200 mg 6 times per week.  Congestion Heart Failure: She takes digoxin Take 0.5 tablet by mouth daily, furosemide 20 mg twice a day.  Hypertension: Metoprolol 100 mg twice a day.  Hyperlipidemia: She takes pravastatin 20 mg daily.  Patient is taking vitamin C 3 times per week.     Current Outpatient Medications on File Prior to Visit  Medication Sig Dispense Refill   acetaminophen (TYLENOL) 500 MG tablet Take 1,000 mg by mouth every 6 (six) hours as needed for mild pain.     amiodarone (PACERONE) 200 MG tablet TAKE 1 TABLET ON MONDAY, TUESDAY, WEDNESDAY, THURSDAY, FRIDAY, AND SATURDAY. DO NOT TAKE ANY ON SUNDAY. (Patient taking differently: Take 100 mg by mouth See admin instructions. 200mg  daily every day except for Sunday's) 78 tablet 3   Ascorbic Acid (VITAMIN C PO) Take 1 tablet by mouth daily.     digoxin (LANOXIN) 0.125 MG tablet Take 0.5 tablets (0.0625 mg total) by mouth daily. Please keep upcoming appt in July 2022 with Dr. Lovena Le before anymore refills. Thank you (Patient taking differently: Take 0.0625 mg by mouth every other day.) 45 tablet 0   estradiol (ESTRACE) 0.1 MG/GM vaginal cream Place 1 Applicatorful vaginally every other day.     furosemide (LASIX) 20 MG tablet TAKE 1 TABLET(20 MG) BY MOUTH TWICE DAILY 30 tablet 0   LORazepam (ATIVAN) 0.5 MG tablet Take 1 tablet (0.5 mg total) by mouth at bedtime. 30 tablet 3   metoprolol tartrate (LOPRESSOR) 100 MG tablet Take 1 tablet (100 mg total) by mouth 2 (two) times daily. 180 tablet 2   pravastatin (PRAVACHOL) 20 MG tablet Take 1 tablet (20 mg total) by mouth daily. 90 tablet 2   sodium bicarbonate 650 MG tablet Take 650 mg by mouth daily.     XARELTO 15  MG TABS tablet TAKE 1 TABLET BY MOUTH DAILY WITH SUPPER 90 tablet 0   No current facility-administered medications on file prior to visit.   Past Medical History:  Diagnosis Date   Acute gangrenous appendicitis with perforation and peritonitis 11/22/2011   Afib ([place]) 2008   CVA (cerebral infarction) 2008   no residual deficits   Osteoporosis    Past Surgical History:  Procedure Laterality Date   INSERT / REPLACE / REMOVE PACEMAKER     LAPAROSCOPIC APPENDECTOMY  11/22/2011   Procedure: APPENDECTOMY LAPAROSCOPIC;  Surgeon: Stark Klein, MD;  Location: [facility];  Service: General;  Laterality: N/A;   PACEMAKER INSERTION     PPM GENERATOR CHANGEOUT N/A 08/16/2017   Procedure: PPM GENERATOR CHANGEOUT;  Surgeon: Evans Lance, MD;  Location: [facility];  Service: Cardiovascular;  Laterality: N/A;   RIGHT/LEFT HEART CATH AND CORONARY ANGIOGRAPHY N/A 02/01/2017   Procedure: Right/Left Heart Cath and Coronary Angiography;  Surgeon: Troy Sine, MD;  Location: [facility];  Service: Cardiovascular;  Laterality: N/A;   TONSILLECTOMY AND ADENOIDECTOMY  1935    Family History  Problem Relation Age of Onset   Coronary artery disease Mother    Heart attack Mother    Atrial fibrillation Father    Lung cancer Father    Alcohol abuse Brother    Failure to thrive  Brother    Social History   Socioeconomic History   Marital status: Widowed    Spouse name: Not on file   Number of children: 3   Years of education: Not on file   Highest education level: Not on file  Occupational History   Occupation: Retired  Tobacco Use   Smoking status: Former    Types: Cigarettes    Quit date: 1975    Years since quitting: 48.1   Smokeless tobacco: Never  Vaping Use   Vaping Use: Never used  Substance and Sexual Activity   Alcohol use: No   Drug use: No   Sexual activity: Not Currently  Other Topics Concern   Not on file  Social History Narrative   Lives with dtr, moved to [place] from  [place] in 2014, widowed early 2013 (73 years married)   Social Determinants of Radio broadcast assistant Strain: Not on file  Food Insecurity: Not on file  Transportation Needs: Not on file  Physical Activity: Not on file  Stress: Not on file  Social Connections: Not on file    Review of Systems  Constitutional:  Negative for chills, fatigue and fever.  HENT:  Negative for congestion, ear pain and sore throat.   Eyes:  Negative for visual disturbance.  Respiratory:  Negative for cough and shortness of breath.   Cardiovascular:  Negative for chest pain and palpitations.  Gastrointestinal:  Negative for abdominal pain, constipation, diarrhea, nausea and vomiting.  Endocrine: Negative for polydipsia, polyphagia and polyuria.  Genitourinary:  Negative for difficulty urinating and dysuria.  Musculoskeletal:  Negative for arthralgias, back pain and myalgias.  Skin:  Negative for rash.  Neurological:  Negative for headaches.  Psychiatric/Behavioral:  Negative for dysphoric mood. The patient is not nervous/anxious.     Objective:  BP 140/70    Pulse 70    Temp 98 F (36.7 C)    Resp 16    Ht 5\' 3"  (1.6 m)    Wt 124 lb (56.2 kg)    SpO2 98%    BMI 21.97 kg/m   BP/Weight 08/06/2021 04/30/2021 92/42/6834  Systolic BP 196 222 979  Diastolic BP 70 60 62  Wt. (Lbs) 124 130 133.4  BMI 21.97 23.03 23.63    Physical Exam Vitals and nursing note reviewed.  Constitutional:      General: She is not in acute distress.    Appearance: Normal appearance. She is normal weight.  HENT:     Head: Normocephalic.     Right Ear: Tympanic membrane, ear canal and external ear normal.     Left Ear: Tympanic membrane, ear canal and external ear normal.     Nose: Nose normal.     Mouth/Throat:     Mouth: Mucous membranes are moist.     Pharynx: Oropharynx is clear. No posterior oropharyngeal erythema.  Eyes:     Extraocular Movements: Extraocular movements intact.     Conjunctiva/sclera:  Conjunctivae normal.     Pupils: Pupils are equal, round, and reactive to light.  Neck:     Vascular: No carotid bruit.  Cardiovascular:     Rate and Rhythm: Normal rate. Rhythm irregular.     Pulses: Normal pulses.     Heart sounds: Normal heart sounds. No murmur heard.   No gallop.  Pulmonary:     Effort: Pulmonary effort is normal. No respiratory distress.     Breath sounds: Normal breath sounds. No wheezing.  Abdominal:  General: Bowel sounds are normal. There is no distension.     Palpations: Abdomen is soft. There is no mass.     Tenderness: There is no abdominal tenderness.  Musculoskeletal:        General: Normal range of motion.     Cervical back: Normal range of motion. No tenderness.     Right lower leg: No edema.     Left lower leg: No edema.  Skin:    General: Skin is warm.     Capillary Refill: Capillary refill takes 2 to 3 seconds.     Coloration: Skin is pale.  Neurological:     General: No focal deficit present.     Mental Status: She is alert and oriented to person, place, and time. Mental status is at baseline.     Gait: Gait normal.     Deep Tendon Reflexes: Reflexes normal.  Psychiatric:        Mood and Affect: Mood normal.        Behavior: Behavior normal.        Thought Content: Thought content normal.    Diabetic Foot Exam - Simple   Simple Foot Form Diabetic Foot exam was performed with the following findings: Yes 08/06/2021 10:26 AM  Visual Inspection No deformities, no ulcerations, no other skin breakdown bilaterally: Yes Sensation Testing Intact to touch and monofilament testing bilaterally: Yes Pulse Check Posterior Tibialis and Dorsalis pulse intact bilaterally: Yes Comments      Lab Results  Component Value Date   WBC 8.1 04/30/2021   HGB 11.9 04/30/2021   HCT 37.6 04/30/2021   PLT 309 04/30/2021   GLUCOSE 87 04/30/2021   CHOL 133 04/30/2021   TRIG 102 04/30/2021   HDL 49 04/30/2021   LDLCALC 65 04/30/2021   ALT 7 04/30/2021    AST 16 04/30/2021   NA 141 04/30/2021   K 4.7 04/30/2021   CL 101 04/30/2021   CREATININE 1.50 (H) 04/30/2021   BUN 23 04/30/2021   CO2 23 04/30/2021   TSH 2.190 03/05/2017   INR 1.15 01/31/2017      Assessment & Plan:   Problem List Items Addressed This Visit       Cardiovascular and Mediastinum   Aortic atherosclerosis ([place]) (Chronic)   Relevant Orders   Lipid panel Patient is on pravastatin, we discussed increasing dose to moderate level    Chronic atrial fibrillation ([place])   Relevant Orders   AMB Referral to [facility] Patient has a diagnosis of chronic atrial fibrillation.   Patient is on xarelto and has controlled ventricular response.  Patient is CV stable .    Chronic systolic CHF (congestive heart failure) (HCC)   Relevant Orders   Comprehensive metabolic panel   CBC with Differential/Platelet   AMB Referral to [facility] An individualized care plan was established and reinforced.  The patient's disease status was assessed using clinical finding son exam today, labs, and/or other diagnostic testing such as x-rays, to determine the patient's success in meeting treatmentgoalsbased on disease-based guidelines and found to beimproving. But not at goal yet. Medications prescriptions no changes Laboratory tests ordered to be performed today include routine. RECOMMENDATIONS: given include see cardiology.  Call physician is patient gains 3 lbs in one day or 5 lbs for one week.  Call for progressive PND, orthopnea or increased pedal edema.     Varicose veins of both lower extremities Patient has varicose veins both legs      Musculoskeletal and  Integument   Age-related osteoporosis without current pathological fracture Patient has osteoporosis and is stable     Genitourinary   Chronic kidney disease, stage 4 (severe) (HCC)   Relevant Orders   AMB Referral to [facility] Patient has stage 4 rena disease and is seeing  nephrology    UTI (urinary tract infection)   Relevant Orders   Urine Culture   POCT URINALYSIS DIP (CLINITEK) (Completed) Patient has UTI     Hematopoietic and Hemostatic   Acquired thrombophilia ([place]) - Primary Patient is on xareto   Other Visit Diagnoses                .30 minute visit with extensive Review of records    Orders Placed This Encounter  Procedures   Urine Culture   Comprehensive metabolic panel   Lipid panel   CBC with Differential/Platelet   AMB Referral to [facility]   POCT URINALYSIS DIP (CLINITEK)     Follow-up: Return in about 3 months (around 11/03/2021) for fasting.  An After Visit Summary was printed and given to the patient.  Reinaldo Meeker, MD [facility] [phone]

## 2021-08-07 ENCOUNTER — Ambulatory Visit: Payer: Medicare Other | Admitting: Legal Medicine

## 2021-08-07 ENCOUNTER — Other Ambulatory Visit: Payer: Self-pay

## 2021-08-07 LAB — CBC WITH DIFFERENTIAL/PLATELET
Basophils Absolute: 0.1 10*3/uL (ref 0.0–0.2)
Basos: 1 %
EOS (ABSOLUTE): 0.5 10*3/uL — ABNORMAL HIGH (ref 0.0–0.4)
Eos: 5 %
Hematocrit: 40.7 % (ref 34.0–46.6)
Hemoglobin: 13.3 g/dL (ref 11.1–15.9)
Immature Grans (Abs): 0.1 10*3/uL (ref 0.0–0.1)
Immature Granulocytes: 1 %
Lymphocytes Absolute: 2.6 10*3/uL (ref 0.7–3.1)
Lymphs: 26 %
MCH: 29.5 pg (ref 26.6–33.0)
MCHC: 32.7 g/dL (ref 31.5–35.7)
MCV: 90 fL (ref 79–97)
Monocytes Absolute: 1 10*3/uL — ABNORMAL HIGH (ref 0.1–0.9)
Monocytes: 10 %
Neutrophils Absolute: 5.7 10*3/uL (ref 1.4–7.0)
Neutrophils: 57 %
Platelets: 283 10*3/uL (ref 150–450)
RBC: 4.51 x10E6/uL (ref 3.77–5.28)
RDW: 13.6 % (ref 11.7–15.4)
WBC: 10 10*3/uL (ref 3.4–10.8)

## 2021-08-07 LAB — COMPREHENSIVE METABOLIC PANEL
ALT: 7 IU/L (ref 0–32)
AST: 16 IU/L (ref 0–40)
Albumin/Globulin Ratio: 1.1 — ABNORMAL LOW (ref 1.2–2.2)
Albumin: 4.1 g/dL (ref 3.5–4.6)
Alkaline Phosphatase: 87 IU/L (ref 44–121)
BUN/Creatinine Ratio: 13 (ref 12–28)
BUN: 24 mg/dL (ref 10–36)
Bilirubin Total: 0.4 mg/dL (ref 0.0–1.2)
CO2: 24 mmol/L (ref 20–29)
Calcium: 9.8 mg/dL (ref 8.7–10.3)
Chloride: 95 mmol/L — ABNORMAL LOW (ref 96–106)
Creatinine, Ser: 1.78 mg/dL — ABNORMAL HIGH (ref 0.57–1.00)
Globulin, Total: 3.6 g/dL (ref 1.5–4.5)
Glucose: 101 mg/dL — ABNORMAL HIGH (ref 70–99)
Potassium: 4.2 mmol/L (ref 3.5–5.2)
Sodium: 138 mmol/L (ref 134–144)
Total Protein: 7.7 g/dL (ref 6.0–8.5)
eGFR: 27 mL/min/{1.73_m2} — ABNORMAL LOW (ref >59)

## 2021-08-07 LAB — LIPID PANEL
Chol/HDL Ratio: 3.2 ratio (ref 0.0–4.4)
Cholesterol, Total: 158 mg/dL (ref 100–199)
HDL: 49 mg/dL (ref >39)
LDL Chol Calc (NIH): 85 mg/dL (ref 0–99)
Triglycerides: 138 mg/dL (ref 0–149)
VLDL Cholesterol Cal: 24 mg/dL (ref 5–40)

## 2021-08-07 LAB — CARDIOVASCULAR RISK ASSESSMENT

## 2021-08-07 MED ORDER — PRAVASTATIN SODIUM 20 MG PO TABS: 20.0000 mg | ORAL_TABLET | Freq: Every day | ORAL | 1 refills | Status: DC

## 2021-08-07 NOTE — Progress Notes (Signed)
Glucose 101, kidney tests remain stage 4, liver tests normal, CBC normal, Cholesterol normal lp,

## 2021-08-09 LAB — URINE CULTURE

## 2021-08-10 ENCOUNTER — Other Ambulatory Visit: Payer: Self-pay | Admitting: Legal Medicine

## 2021-08-10 DIAGNOSIS — N3000 Acute cystitis without hematuria: Secondary | ICD-10-CM

## 2021-08-10 MED ORDER — CEPHALEXIN 500 MG PO CAPS: 500.0000 mg | ORAL_CAPSULE | Freq: Two times a day (BID) | ORAL | 0 refills | Status: DC

## 2021-08-10 NOTE — Progress Notes (Signed)
Urine culture klebsiella pneumoniae start keflex lp

## 2021-08-12 ENCOUNTER — Telehealth: Payer: Self-pay | Admitting: *Deleted

## 2021-08-12 NOTE — Chronic Care Management (AMB) (Signed)
Chronic Care Management   Note  08/12/2021 Name: Mariann Palo MRN: [id number] DOB: 1930/04/20  Anani Gu is a 88 y.o. year old female who is a primary care patient of Lillard Anes, MD. I reached out to [name] by phone today in response to a referral sent by Ms. Tywanda Maynez's PCP.  Ms. Soberanis was given information about Chronic Care Management services today including:  CCM service includes personalized support from designated clinical staff supervised by her physician, including individualized plan of care and coordination with other care providers 24/7 contact phone numbers for assistance for urgent and routine care needs. Service will only be billed when office clinical staff spend 20 minutes or more in a month to coordinate care. Only one practitioner may furnish and bill the service in a calendar month. The patient may stop CCM services at any time (effective at the end of the month) by phone call to the office staff. The patient is responsible for co-pay (up to 20% after annual deductible is met) if co-pay is required by the individual health plan.   Patient did not agree to enrollment in care management services and does not wish to consider at this time.  Follow up plan: Patient declines engagement by the care management team at this time. Appropriate care team members and provider have been notified via electronic communication.  The care management team is available to follow up with the patient after provider conversation with the patient regarding recommendation for care management engagement and subsequent re-referral to the care management team.   [facility]  Direct Dial: [phone]

## 2021-08-21 ENCOUNTER — Ambulatory Visit (INDEPENDENT_AMBULATORY_CARE_PROVIDER_SITE_OTHER): Payer: Medicare Other

## 2021-08-21 DIAGNOSIS — I495 Sick sinus syndrome: Secondary | ICD-10-CM

## 2021-08-21 LAB — CUP PACEART REMOTE DEVICE CHECK
Battery Remaining Longevity: 36 mo
Battery Remaining Percentage: 62 %
Brady Statistic RA Percent Paced: 0 %
Brady Statistic RV Percent Paced: 89 %
Date Time Interrogation Session: 20230216030100
Implantable Lead Implant Date: 20080827
Implantable Lead Implant Date: 20080827
Implantable Lead Location: 753859
Implantable Lead Location: 753860
Implantable Lead Model: 4135
Implantable Lead Model: 4136
Implantable Lead Serial Number: 28356762
Implantable Lead Serial Number: 28365470
Implantable Pulse Generator Implant Date: 20190211
Lead Channel Impedance Value: 493 Ohm
Lead Channel Impedance Value: 606 Ohm
Lead Channel Pacing Threshold Amplitude: 0.7 V
Lead Channel Pacing Threshold Pulse Width: 0.4 ms
Lead Channel Setting Pacing Amplitude: 2 V
Lead Channel Setting Pacing Amplitude: 2.4 V
Lead Channel Setting Pacing Pulse Width: 0.4 ms
Lead Channel Setting Sensing Sensitivity: 2.5 mV
Pulse Gen Serial Number: 396317

## 2021-08-26 NOTE — Progress Notes (Signed)
Remote pacemaker transmission.   

## 2021-09-23 ENCOUNTER — Other Ambulatory Visit: Payer: Self-pay

## 2021-09-23 ENCOUNTER — Other Ambulatory Visit: Payer: Medicare Other

## 2021-09-23 DIAGNOSIS — N184 Chronic kidney disease, stage 4 (severe): Secondary | ICD-10-CM

## 2021-09-24 ENCOUNTER — Other Ambulatory Visit: Payer: Self-pay | Admitting: Internal Medicine

## 2021-09-24 NOTE — Telephone Encounter (Signed)
Prescription refill request for Xarelto received.  ?Indication: Afib ?Last office visit:01/28/21 Lovena Le) ?Weight: 56.2kg ?Age: 88 ?Scr: 1.78 (08/06/21) ?CrCl: 18.59ml/min ? ?Appropriate dose and refill sent to requested pharmacy.  ? ?

## 2021-11-20 ENCOUNTER — Ambulatory Visit (INDEPENDENT_AMBULATORY_CARE_PROVIDER_SITE_OTHER): Payer: Medicare Other

## 2021-11-20 DIAGNOSIS — I495 Sick sinus syndrome: Secondary | ICD-10-CM | POA: Diagnosis not present

## 2021-11-20 LAB — CUP PACEART REMOTE DEVICE CHECK
Battery Remaining Longevity: 36 mo
Battery Remaining Percentage: 56 %
Brady Statistic RA Percent Paced: 0 %
Brady Statistic RV Percent Paced: 89 %
Date Time Interrogation Session: 20230518030100
Implantable Lead Implant Date: 20080827
Implantable Lead Implant Date: 20080827
Implantable Lead Location: 753859
Implantable Lead Location: 753860
Implantable Lead Model: 4135
Implantable Lead Model: 4136
Implantable Lead Serial Number: 28356762
Implantable Lead Serial Number: 28365470
Implantable Pulse Generator Implant Date: 20190211
Lead Channel Impedance Value: 491 Ohm
Lead Channel Impedance Value: 617 Ohm
Lead Channel Pacing Threshold Amplitude: 0.7 V
Lead Channel Pacing Threshold Pulse Width: 0.4 ms
Lead Channel Setting Pacing Amplitude: 2 V
Lead Channel Setting Pacing Amplitude: 2.4 V
Lead Channel Setting Pacing Pulse Width: 0.4 ms
Lead Channel Setting Sensing Sensitivity: 2.5 mV
Pulse Gen Serial Number: 396317

## 2021-11-21 ENCOUNTER — Ambulatory Visit: Payer: Medicare Other | Admitting: Family Medicine

## 2021-11-21 ENCOUNTER — Encounter: Payer: Self-pay | Admitting: Family Medicine

## 2021-11-21 VITALS — BP 138/68 | HR 82 | Temp 97.7°F | Ht 63.0 in | Wt 125.0 lb

## 2021-11-21 DIAGNOSIS — M10071 Idiopathic gout, right ankle and foot: Secondary | ICD-10-CM

## 2021-11-21 MED ORDER — TRIAMCINOLONE ACETONIDE 40 MG/ML IJ SUSP
80.0000 mg | Freq: Once | INTRAMUSCULAR | Status: AC
  Administered 2021-11-21: 80 mg via INTRAMUSCULAR

## 2021-11-21 NOTE — Progress Notes (Signed)
Acute Office Visit  Subjective:    Patient ID: Tracy Vasquez, female    DOB: May 07, 1930, 88 y.o.   MRN: [id number]  Chief Complaint  Patient presents with   Right foot pain    HPI Patient is in today for sharp and aching pain on 1st toe on right foot since 2 days ago.  Past Medical History:  Diagnosis Date   Acute gangrenous appendicitis with perforation and peritonitis 11/22/2011   Afib ([place]) 2008   CVA (cerebral infarction) 2008   no residual deficits   Osteoporosis     Past Surgical History:  Procedure Laterality Date   INSERT / REPLACE / REMOVE PACEMAKER     LAPAROSCOPIC APPENDECTOMY  11/22/2011   Procedure: APPENDECTOMY LAPAROSCOPIC;  Surgeon: Stark Klein, MD;  Location: [facility];  Service: General;  Laterality: N/A;   PACEMAKER INSERTION     PPM GENERATOR CHANGEOUT N/A 08/16/2017   Procedure: PPM GENERATOR CHANGEOUT;  Surgeon: Evans Lance, MD;  Location: [facility];  Service: Cardiovascular;  Laterality: N/A;   RIGHT/LEFT HEART CATH AND CORONARY ANGIOGRAPHY N/A 02/01/2017   Procedure: Right/Left Heart Cath and Coronary Angiography;  Surgeon: Troy Sine, MD;  Location: [facility];  Service: Cardiovascular;  Laterality: N/A;   TONSILLECTOMY AND ADENOIDECTOMY  1935    Family History  Problem Relation Age of Onset   Coronary artery disease Mother    Heart attack Mother    Atrial fibrillation Father    Lung cancer Father    Alcohol abuse Brother    Failure to thrive Brother     Social History   Socioeconomic History   Marital status: Widowed    Spouse name: Not on file   Number of children: 3   Years of education: Not on file   Highest education level: Not on file  Occupational History   Occupation: Retired  Tobacco Use   Smoking status: Former    Types: Cigarettes    Quit date: 1975    Years since quitting: 48.4   Smokeless tobacco: Never  Vaping Use   Vaping Use: Never used  Substance and Sexual Activity   Alcohol use: No    Drug use: No   Sexual activity: Not Currently  Other Topics Concern   Not on file  Social History Narrative   Lives with dtr, moved to [place] from [place] in 2014, widowed early 2013 (70 years married)   Social Determinants of Radio broadcast assistant Strain: Not on file  Food Insecurity: Not on file  Transportation Needs: Not on file  Physical Activity: Not on file  Stress: Not on file  Social Connections: Not on file  Intimate Partner Violence: Not on file    Outpatient Medications Prior to Visit  Medication Sig Dispense Refill   acetaminophen (TYLENOL) 500 MG tablet Take 1,000 mg by mouth every 6 (six) hours as needed for mild pain.     amiodarone (PACERONE) 200 MG tablet TAKE 1 TABLET ON MONDAY, TUESDAY, WEDNESDAY, THURSDAY, FRIDAY, AND SATURDAY. DO NOT TAKE ANY ON SUNDAY. (Patient taking differently: Take 100 mg by mouth See admin instructions. 200mg  daily every day except for Sunday's) 78 tablet 3   Ascorbic Acid (VITAMIN C PO) Take 1 tablet by mouth daily.     digoxin (LANOXIN) 0.125 MG tablet Take 0.5 tablets (0.0625 mg total) by mouth daily. Please keep upcoming appt in July 2022 with Dr. Lovena Le before anymore refills. Thank you (Patient taking differently: Take 0.0625  mg by mouth every other day.) 45 tablet 0   estradiol (ESTRACE) 0.1 MG/GM vaginal cream Place 1 Applicatorful vaginally every other day.     furosemide (LASIX) 20 MG tablet TAKE 1 TABLET(20 MG) BY MOUTH TWICE DAILY 30 tablet 0   LORazepam (ATIVAN) 0.5 MG tablet Take 1 tablet (0.5 mg total) by mouth at bedtime. 30 tablet 3   metoprolol tartrate (LOPRESSOR) 100 MG tablet Take 1 tablet (100 mg total) by mouth 2 (two) times daily. 180 tablet 2   pravastatin (PRAVACHOL) 20 MG tablet Take 1 tablet (20 mg total) by mouth daily. 90 tablet 1   sodium bicarbonate 650 MG tablet Take 650 mg by mouth daily.     XARELTO 15 MG TABS tablet TAKE 1 TABLET BY MOUTH DAILY WITH SUPPER 90 tablet 0   cephALEXin (KEFLEX) 500 MG  capsule Take 1 capsule (500 mg total) by mouth 2 (two) times daily. 20 capsule 0   No facility-administered medications prior to visit.    No Known Allergies  Review of Systems  Constitutional:  Negative for appetite change, fatigue and fever.  HENT:  Negative for congestion, ear pain, sinus pressure and sore throat.   Respiratory:  Negative for cough, chest tightness, shortness of breath and wheezing.   Cardiovascular:  Negative for chest pain and palpitations.  Gastrointestinal:  Negative for abdominal pain, constipation, diarrhea, nausea and vomiting.  Genitourinary:  Negative for dysuria and hematuria.  Musculoskeletal:  Positive for myalgias (Right foot pain). Negative for arthralgias, back pain and joint swelling.  Skin:  Negative for rash.  Neurological:  Negative for dizziness, weakness and headaches.  Psychiatric/Behavioral:  Negative for dysphoric mood. The patient is not nervous/anxious.       Objective:    Physical Exam Vitals reviewed.  Constitutional:      Appearance: Normal appearance. She is normal weight.  Musculoskeletal:        General: Tenderness (rt foot: warm, tender, erythema.) present.  Neurological:     Mental Status: She is alert.    BP 138/68 (BP Location: Left Arm, Patient Position: Sitting)   Pulse 82   Temp 97.7 F (36.5 C) (Temporal)   Ht 5\' 3"  (1.6 m)   Wt 125 lb (56.7 kg)   SpO2 97%   BMI 22.14 kg/m  Wt Readings from Last 3 Encounters:  11/21/21 125 lb (56.7 kg)  08/06/21 124 lb (56.2 kg)  04/30/21 130 lb (59 kg)    Health Maintenance Due  Topic Date Due   TETANUS/TDAP  Never done   Zoster Vaccines- Shingrix (1 of 2) Never done    There are no preventive care reminders to display for this patient.   Lab Results  Component Value Date   TSH 2.190 03/05/2017   Lab Results  Component Value Date   WBC 8.7 11/21/2021   HGB 14.4 11/21/2021   HCT 44.0 11/21/2021   MCV 93 11/21/2021   PLT 255 11/21/2021   Lab Results   Component Value Date   NA 138 08/06/2021   K 4.2 08/06/2021   CO2 24 08/06/2021   GLUCOSE 101 (H) 08/06/2021   BUN 24 08/06/2021   CREATININE 1.78 (H) 08/06/2021   BILITOT 0.4 08/06/2021   ALKPHOS 87 08/06/2021   AST 16 08/06/2021   ALT 7 08/06/2021   PROT 7.7 08/06/2021   ALBUMIN 4.1 08/06/2021   CALCIUM 9.8 08/06/2021   ANIONGAP 9 04/19/2021   EGFR 27 (L) 08/06/2021   GFR 35.27 (L) 04/25/2014  Lab Results  Component Value Date   CHOL 158 08/06/2021   Lab Results  Component Value Date   HDL 49 08/06/2021   Lab Results  Component Value Date   LDLCALC 85 08/06/2021   Lab Results  Component Value Date   TRIG 138 08/06/2021   Lab Results  Component Value Date   CHOLHDL 3.2 08/06/2021   No results found for: HGBA1C       Assessment & Plan:   Problem List Items Addressed This Visit       Musculoskeletal and Integument   Acute idiopathic gout involving toe of right foot - Primary    Kenalog 80 mg IM shot was given at the office. Tylenol for pain.  Check labs.      Relevant Orders   CBC with Differential/Platelet (Completed)   Uric acid (Completed)   Meds ordered this encounter  Medications   triamcinolone acetonide (KENALOG-40) injection 80 mg   I,Lauren M Auman,acting as a scribe for Rochel Brome, MD.,have documented all relevant documentation on the behalf of Rochel Brome, MD,as directed by  Rochel Brome, MD while in the presence of Rochel Brome, MD.   Rochel Brome, MD

## 2021-11-21 NOTE — Patient Instructions (Signed)
Tylenol for pain

## 2021-11-22 LAB — CBC WITH DIFFERENTIAL/PLATELET
Basophils Absolute: 0.1 10*3/uL (ref 0.0–0.2)
Basos: 1 %
EOS (ABSOLUTE): 0.1 10*3/uL (ref 0.0–0.4)
Eos: 2 %
Hematocrit: 44 % (ref 34.0–46.6)
Hemoglobin: 14.4 g/dL (ref 11.1–15.9)
Immature Grans (Abs): 0 10*3/uL (ref 0.0–0.1)
Immature Granulocytes: 0 %
Lymphocytes Absolute: 2.4 10*3/uL (ref 0.7–3.1)
Lymphs: 27 %
MCH: 30.4 pg (ref 26.6–33.0)
MCHC: 32.7 g/dL (ref 31.5–35.7)
MCV: 93 fL (ref 79–97)
Monocytes Absolute: 0.8 10*3/uL (ref 0.1–0.9)
Monocytes: 9 %
Neutrophils Absolute: 5.3 10*3/uL (ref 1.4–7.0)
Neutrophils: 61 %
Platelets: 255 10*3/uL (ref 150–450)
RBC: 4.74 x10E6/uL (ref 3.77–5.28)
RDW: 12.1 % (ref 11.7–15.4)
WBC: 8.7 10*3/uL (ref 3.4–10.8)

## 2021-11-22 LAB — URIC ACID: Uric Acid: 11.6 mg/dL — ABNORMAL HIGH (ref 3.1–7.9)

## 2021-11-23 DIAGNOSIS — M10071 Idiopathic gout, right ankle and foot: Secondary | ICD-10-CM | POA: Insufficient documentation

## 2021-11-23 DIAGNOSIS — M109 Gout, unspecified: Secondary | ICD-10-CM | POA: Insufficient documentation

## 2021-11-23 NOTE — Assessment & Plan Note (Signed)
Kenalog 80 mg IM shot was given at the office. Tylenol for pain.  Check labs.

## 2021-11-23 NOTE — Progress Notes (Signed)
Consistent with gout.  Please ask if patient has improved with steroid shot given. If patient is still having pain, please let me know and we will send new medicine.   If gout flare is cleared up, would recommend Uloric 20 mg daily and colchicine 0.3 mg daily to help prevent gout flare in the future.  Patient will need to have a follow-up in 3 months for her gout.  This may coincide with her other follow-ups.

## 2021-11-24 ENCOUNTER — Telehealth: Payer: Self-pay

## 2021-11-24 ENCOUNTER — Other Ambulatory Visit: Payer: Self-pay

## 2021-11-24 MED ORDER — FEBUXOSTAT 40 MG PO TABS: 20.0000 mg | ORAL_TABLET | Freq: Every day | ORAL | 2 refills | Status: DC

## 2021-11-24 NOTE — Telephone Encounter (Signed)
I left detailed message on voicemail. ?

## 2021-11-24 NOTE — Telephone Encounter (Signed)
-----   Message from Rochel Brome, MD sent at 11/24/2021 12:56 PM EDT ----- Regarding: med adjustment. Colchicine is contraindicated with amiodarone. We will just try her on uloric.  Dr Tobie Poet

## 2021-11-24 NOTE — Telephone Encounter (Signed)
Patient informed of medication change. Aware to pick up uloric.

## 2021-12-02 NOTE — Progress Notes (Signed)
Remote pacemaker transmission.   

## 2021-12-20 ENCOUNTER — Other Ambulatory Visit: Payer: Self-pay | Admitting: Internal Medicine

## 2021-12-22 NOTE — Telephone Encounter (Signed)
Prescription refill request for Xarelto received.  Indication:Afib Last office visit:7/22 Weight:56.7 kg Age:88 Scr:1.7 CrCl:19.69 ml/min  Prescription refilled

## 2021-12-25 ENCOUNTER — Other Ambulatory Visit: Payer: Medicare Other

## 2021-12-25 ENCOUNTER — Other Ambulatory Visit: Payer: Self-pay

## 2021-12-25 DIAGNOSIS — N184 Chronic kidney disease, stage 4 (severe): Secondary | ICD-10-CM

## 2022-01-02 ENCOUNTER — Other Ambulatory Visit: Payer: Self-pay | Admitting: Family Medicine

## 2022-01-20 ENCOUNTER — Ambulatory Visit: Payer: Medicare Other | Admitting: Legal Medicine

## 2022-01-20 NOTE — Progress Notes (Deleted)
Subjective:  Patient ID: Tracy Vasquez, female    DOB: 02/29/1930  Age: 88 y.o. MRN: [id number]  No chief complaint on file.   HPI Afib: Patient is taking Xarelto 15 mg daily.  Patient presents with hyperlipidemia.  Compliance with treatment has been good; patient takes medicines as directed, maintains low cholesterol diet, follows up as directed, and maintains exercise regimen.  Patient is using Pravastatin 20 mg daily without problems.    CHF: She takes amiodarone 200 mg every day except Sunday, Digoxin 0.0625 mg every other day, furosemide 20 mg daily. Current Outpatient Medications on File Prior to Visit  Medication Sig Dispense Refill   acetaminophen (TYLENOL) 500 MG tablet Take 1,000 mg by mouth every 6 (six) hours as needed for mild pain.     amiodarone (PACERONE) 200 MG tablet TAKE 1 TABLET ON MONDAY, TUESDAY, WEDNESDAY, THURSDAY, FRIDAY, AND SATURDAY. DO NOT TAKE ANY ON SUNDAY. (Patient taking differently: Take 100 mg by mouth See admin instructions. 200mg  daily every day except for Sunday's) 78 tablet 3   Ascorbic Acid (VITAMIN C PO) Take 1 tablet by mouth daily.     digoxin (LANOXIN) 0.125 MG tablet Take 0.5 tablets (0.0625 mg total) by mouth daily. Please keep upcoming appt in July 2022 with Dr. Lovena Le before anymore refills. Thank you (Patient taking differently: Take 0.0625 mg by mouth every other day.) 45 tablet 0   estradiol (ESTRACE) 0.1 MG/GM vaginal cream Place 1 Applicatorful vaginally every other day.     febuxostat (ULORIC) 40 MG tablet Take 0.5 tablets (20 mg total) by mouth daily. 30 tablet 2   furosemide (LASIX) 20 MG tablet TAKE 1 TABLET(20 MG) BY MOUTH TWICE DAILY 30 tablet 0   LORazepam (ATIVAN) 0.5 MG tablet TAKE 1 TABLET(0.5 MG) BY MOUTH AT BEDTIME 30 tablet 2   metoprolol tartrate (LOPRESSOR) 100 MG tablet Take 1 tablet (100 mg total) by mouth 2 (two) times daily. 180 tablet 2   pravastatin (PRAVACHOL) 20 MG tablet Take 1 tablet (20 mg total) by mouth daily. 90  tablet 1   Rivaroxaban (XARELTO) 15 MG TABS tablet TAKE 1 TABLET BY MOUTH DAILY WITH SUPPER 90 tablet 1   sodium bicarbonate 650 MG tablet Take 650 mg by mouth daily.     No current facility-administered medications on file prior to visit.   Past Medical History:  Diagnosis Date   Acute gangrenous appendicitis with perforation and peritonitis 11/22/2011   Afib ([place]) 2008   CVA (cerebral infarction) 2008   no residual deficits   Osteoporosis    Past Surgical History:  Procedure Laterality Date   INSERT / REPLACE / REMOVE PACEMAKER     LAPAROSCOPIC APPENDECTOMY  11/22/2011   Procedure: APPENDECTOMY LAPAROSCOPIC;  Surgeon: Stark Klein, MD;  Location: [facility];  Service: General;  Laterality: N/A;   PACEMAKER INSERTION     PPM GENERATOR CHANGEOUT N/A 08/16/2017   Procedure: PPM GENERATOR CHANGEOUT;  Surgeon: Evans Lance, MD;  Location: [facility];  Service: Cardiovascular;  Laterality: N/A;   RIGHT/LEFT HEART CATH AND CORONARY ANGIOGRAPHY N/A 02/01/2017   Procedure: Right/Left Heart Cath and Coronary Angiography;  Surgeon: Troy Sine, MD;  Location: [facility];  Service: Cardiovascular;  Laterality: N/A;   TONSILLECTOMY AND ADENOIDECTOMY  1935    Family History  Problem Relation Age of Onset   Coronary artery disease Mother    Heart attack Mother    Atrial fibrillation Father    Lung cancer Father  Alcohol abuse Brother    Failure to thrive Brother    Social History   Socioeconomic History   Marital status: Widowed    Spouse name: Not on file   Number of children: 3   Years of education: Not on file   Highest education level: Not on file  Occupational History   Occupation: Retired  Tobacco Use   Smoking status: Former    Types: Cigarettes    Quit date: 1975    Years since quitting: 48.5   Smokeless tobacco: Never  Vaping Use   Vaping Use: Never used  Substance and Sexual Activity   Alcohol use: No   Drug use: No   Sexual activity: Not Currently   Other Topics Concern   Not on file  Social History Narrative   Lives with dtr, moved to [place] from [place] in 2014, widowed early 2013 (33 years married)   Social Determinants of Radio broadcast assistant Strain: Not on file  Food Insecurity: Not on file  Transportation Needs: Not on file  Physical Activity: Not on file  Stress: Not on file  Social Connections: Not on file    Review of Systems   Objective:  There were no vitals taken for this visit.     11/21/2021   10:40 AM 08/06/2021   10:03 AM 04/30/2021    2:24 PM  BP/Weight  Systolic BP 677 034 035  Diastolic BP 68 70 60  Wt. (Lbs) 125 124 130  BMI 22.14 kg/m2 21.97 kg/m2 23.03 kg/m2    Physical Exam  Diabetic Foot Exam - Simple   No data filed      Lab Results  Component Value Date   WBC 8.7 11/21/2021   HGB 14.4 11/21/2021   HCT 44.0 11/21/2021   PLT 255 11/21/2021   GLUCOSE 101 (H) 08/06/2021   CHOL 158 08/06/2021   TRIG 138 08/06/2021   HDL 49 08/06/2021   LDLCALC 85 08/06/2021   ALT 7 08/06/2021   AST 16 08/06/2021   NA 138 08/06/2021   K 4.2 08/06/2021   CL 95 (L) 08/06/2021   CREATININE 1.78 (H) 08/06/2021   BUN 24 08/06/2021   CO2 24 08/06/2021   TSH 2.190 03/05/2017   INR 1.15 01/31/2017      Assessment & Plan:   Problem List Items Addressed This Visit       Cardiovascular and Mediastinum   Aortic atherosclerosis ([place]) - Primary (Chronic)   Chronic atrial fibrillation (HCC)   Chronic systolic CHF (congestive heart failure) (HCC)     Musculoskeletal and Integument   Age-related osteoporosis without current pathological fracture   Acute idiopathic gout involving toe of right foot     Genitourinary   Chronic kidney disease, stage 4 (severe) (HCC)     Hematopoietic and Hemostatic   Acquired thrombophilia ([place])     Other   Pacemaker   Other Visit Diagnoses     Mixed hyperlipidemia         .  No orders of the defined types were placed in this  encounter.   No orders of the defined types were placed in this encounter.    Follow-up: No follow-ups on file.  An After Visit Summary was printed and given to the patient.  Reinaldo Meeker, MD [facility] [phone]

## 2022-02-03 ENCOUNTER — Encounter: Payer: Self-pay | Admitting: Internal Medicine

## 2022-02-03 ENCOUNTER — Other Ambulatory Visit: Payer: Self-pay | Admitting: Legal Medicine

## 2022-02-03 ENCOUNTER — Ambulatory Visit: Payer: Medicare Other | Admitting: Internal Medicine

## 2022-02-03 VITALS — BP 132/72 | HR 70 | Ht 61.5 in | Wt 132.0 lb

## 2022-02-03 DIAGNOSIS — Z950 Presence of cardiac pacemaker: Secondary | ICD-10-CM

## 2022-02-03 DIAGNOSIS — I495 Sick sinus syndrome: Secondary | ICD-10-CM

## 2022-02-03 DIAGNOSIS — I482 Chronic atrial fibrillation, unspecified: Secondary | ICD-10-CM

## 2022-02-03 NOTE — Progress Notes (Signed)
HPI Tracy Vasquez returns today for followup. She is a pleasant 88 yo woman with a h/o HTN, persistent atrial fib, and CHB, s/p PPM insertion. She denies chest pain, sob, peripheral edema, and has not had syncope. She remains active. she has been on low dose amiodarone. She has gone back into atrial fib and did not know it. She denies chest pain or sob.  No Known Allergies   Current Outpatient Medications  Medication Sig Dispense Refill   acetaminophen (TYLENOL) 500 MG tablet Take 1,000 mg by mouth every 6 (six) hours as needed for mild pain.     Ascorbic Acid (VITAMIN C PO) Take 1 tablet by mouth daily.     digoxin (LANOXIN) 0.125 MG tablet Take 0.5 tablets (0.0625 mg total) by mouth daily. Please keep upcoming appt in July 2022 with Dr. Lovena Vasquez before anymore refills. Thank you (Patient taking differently: Take 0.0625 mg by mouth every other day.) 45 tablet 0   estradiol (ESTRACE) 0.1 MG/GM vaginal cream Place 1 Applicatorful vaginally every other day.     febuxostat (ULORIC) 40 MG tablet Take 0.5 tablets (20 mg total) by mouth daily. 30 tablet 2   furosemide (LASIX) 20 MG tablet TAKE 1 TABLET(20 MG) BY MOUTH TWICE DAILY 30 tablet 0   LORazepam (ATIVAN) 0.5 MG tablet TAKE 1 TABLET(0.5 MG) BY MOUTH AT BEDTIME 30 tablet 2   metoprolol tartrate (LOPRESSOR) 100 MG tablet Take 1 tablet (100 mg total) by mouth 2 (two) times daily. 180 tablet 2   pravastatin (PRAVACHOL) 20 MG tablet TAKE 1 TABLET(20 MG) BY MOUTH DAILY 90 tablet 1   Rivaroxaban (XARELTO) 15 MG TABS tablet TAKE 1 TABLET BY MOUTH DAILY WITH SUPPER 90 tablet 1   sodium bicarbonate 650 MG tablet Take 650 mg by mouth daily.     No current facility-administered medications for this visit.     Past Medical History:  Diagnosis Date   Acute gangrenous appendicitis with perforation and peritonitis 11/22/2011   Afib ([place]) 2008   CVA (cerebral infarction) 2008   no residual deficits   Osteoporosis     ROS:   All systems  reviewed and negative except as noted in the HPI.   Past Surgical History:  Procedure Laterality Date   INSERT / REPLACE / REMOVE PACEMAKER     LAPAROSCOPIC APPENDECTOMY  11/22/2011   Procedure: APPENDECTOMY LAPAROSCOPIC;  Surgeon: Stark Klein, MD;  Location: [facility];  Service: General;  Laterality: N/A;   PACEMAKER INSERTION     PPM GENERATOR CHANGEOUT N/A 08/16/2017   Procedure: PPM GENERATOR CHANGEOUT;  Surgeon: Evans Lance, MD;  Location: [facility];  Service: Cardiovascular;  Laterality: N/A;   RIGHT/LEFT HEART CATH AND CORONARY ANGIOGRAPHY N/A 02/01/2017   Procedure: Right/Left Heart Cath and Coronary Angiography;  Surgeon: Troy Sine, MD;  Location: [facility];  Service: Cardiovascular;  Laterality: N/A;   TONSILLECTOMY AND ADENOIDECTOMY  1935     Family History  Problem Relation Age of Onset   Coronary artery disease Mother    Heart attack Mother    Atrial fibrillation Father    Lung cancer Father    Alcohol abuse Brother    Failure to thrive Brother      Social History   Socioeconomic History   Marital status: Widowed    Spouse name: Not on file   Number of children: 3   Years of education: Not on file   Highest education level: Not on  file  Occupational History   Occupation: Retired  Tobacco Use   Smoking status: Former    Types: Cigarettes    Quit date: 1975    Years since quitting: 48.6   Smokeless tobacco: Never  Vaping Use   Vaping Use: Never used  Substance and Sexual Activity   Alcohol use: No   Drug use: No   Sexual activity: Not Currently  Other Topics Concern   Not on file  Social History Narrative   Lives with dtr, moved to [place] from [place] in 2014, widowed early 2013 (43 years married)   Social Determinants of Radio broadcast assistant Strain: Not on file  Food Insecurity: Not on file  Transportation Needs: Not on file  Physical Activity: Not on file  Stress: Not on file  Social Connections: Not on file   Intimate Partner Violence: Not on file     BP 132/72   Pulse 70   Ht 5' 1.5" (1.562 m)   Wt 132 lb (59.9 kg)   SpO2 96%   BMI 24.54 kg/m   Physical Exam:  Well appearing NAD HEENT: Unremarkable Neck:  No JVD, no thyromegally Lymphatics:  No adenopathy Back:  No CVA tenderness Lungs:  Clear with no wheezes HEART:  Regular rate rhythm, no murmurs, no rubs, no clicks Abd:  soft, positive bowel sounds, no organomegally, no rebound, no guarding Ext:  2 plus pulses, no edema, no cyanosis, no clubbing Skin:  No rashes no nodules Neuro:  CN II through XII intact, motor grossly intact  EKG - atrial fib with ventricular pacing  DEVICE : normal device function  Assess/Plan:   Atrial fib - her VR is controlled. She will continue our current meds but I asked her to stop amiodarone.  HTN - her bp is elevated a bit. We will ask her to avoid salty foods. No change in her meds now. PPM - her [employer] DDD PM is working normally.  HTN - her sbp is ok today. She is encouraged to reduce her salty foods.    Carleene Overlie TaylorMD

## 2022-02-03 NOTE — Patient Instructions (Addendum)
Medication Instructions:  Your physician has recommended you make the following change in your medication:    STOP TAKING:  AMIODARONE 200 Mg.     Lab Work: None ordered.  If you have labs (blood work) drawn today and your tests are completely normal, you will receive your results only by: [place] (if you have MyChart) OR A paper copy in the mail If you have any lab test that is abnormal or we need to change your treatment, we will call you to review the results.  Testing/Procedures: None ordered.  Follow-Up: At [facility] [place] [facility], you and your health needs are our priority.  As part of our continuing mission to provide you with exceptional heart care, we have created designated Provider Care Teams.  These Care Teams include your primary Cardiologist (physician) and Advanced Practice Providers (APPs -  Physician Assistants and Nurse Practitioners) who all work together to provide you with the care you need, when you need it.  We recommend signing up for the patient portal called "MyChart".  Sign up information is provided on this After Visit Summary.  MyChart is used to connect with patients for Virtual Visits (Telemedicine).  Patients are able to view lab/test results, encounter notes, upcoming appointments, etc.  Non-urgent messages can be sent to your provider as well.   To learn more about what you can do with MyChart, go to [email or web address].    Your next appointment:   1 year(s)  The format for your next appointment:   In Person  Provider:   Cristopher Peru, MD{or one of the following Advanced Practice Providers on your designated Care Team:   Tommye Standard, [place] Legrand Como "Jonni Sanger" Chalmers Cater, [place]  Remote monitoring is used to monitor your Pacemaker from home. This monitoring reduces the number of office visits required to check your device to one time per year. It allows [place] to keep an eye on the functioning of your device to ensure it is working properly. You are scheduled for  a device check from home on 02/20/2022. You may send your transmission at any time that day. If you have a wireless device, the transmission will be sent automatically. After your physician reviews your transmission, you will receive a postcard with your next transmission date.  Important Information About Sugar

## 2022-02-10 ENCOUNTER — Ambulatory Visit: Payer: Medicare Other | Admitting: Legal Medicine

## 2022-02-10 ENCOUNTER — Encounter: Payer: Self-pay | Admitting: Legal Medicine

## 2022-02-10 VITALS — BP 124/60 | HR 68 | Temp 98.3°F | Resp 15 | Ht 61.5 in | Wt 123.0 lb

## 2022-02-10 DIAGNOSIS — M810 Age-related osteoporosis without current pathological fracture: Secondary | ICD-10-CM

## 2022-02-10 DIAGNOSIS — Z6822 Body mass index (BMI) 22.0-22.9, adult: Secondary | ICD-10-CM

## 2022-02-10 DIAGNOSIS — I5022 Chronic systolic (congestive) heart failure: Secondary | ICD-10-CM

## 2022-02-10 DIAGNOSIS — I482 Chronic atrial fibrillation, unspecified: Secondary | ICD-10-CM

## 2022-02-10 DIAGNOSIS — F5101 Primary insomnia: Secondary | ICD-10-CM

## 2022-02-10 DIAGNOSIS — M10071 Idiopathic gout, right ankle and foot: Secondary | ICD-10-CM

## 2022-02-10 DIAGNOSIS — D6869 Other thrombophilia: Secondary | ICD-10-CM

## 2022-02-10 DIAGNOSIS — I700 Atherosclerosis of aorta: Secondary | ICD-10-CM

## 2022-02-10 DIAGNOSIS — R600 Localized edema: Secondary | ICD-10-CM

## 2022-02-10 DIAGNOSIS — Z950 Presence of cardiac pacemaker: Secondary | ICD-10-CM

## 2022-02-10 MED ORDER — FUROSEMIDE 20 MG PO TABS: 20.0000 mg | ORAL_TABLET | Freq: Three times a day (TID) | ORAL | 3 refills | Status: DC

## 2022-02-10 NOTE — Progress Notes (Signed)
Subjective:  Patient ID: Tracy Vasquez, female    DOB: 01/28/1930  Age: 88 y.o. MRN: [id number]  Chief Complaint  Patient presents with   Coronary Artery Disease   Gout   Atrial Fibrillation    HPI: chronic patient at [facility] Afib: Patient is taking Digoxin 0.0625 mg every other day, Xarelto 15 mg daily.no exacerbation of CHF  Patient presents with hyperlipidemia.  Compliance with treatment has been good; patient takes medicines as directed, maintains low cholesterol diet, follows up as directed, and maintains exercise regimen.  Patient is using Pravastatin 20 mg daily without problems.   Gout: Taking Uloric 40 mg 1/2 tablet daily.one episode of gout  CHF: She is taking Metoprolol 100 mg twice a day. Last BNP 331 one episode in July but fluid is down and feels better. O chest pain   Current Outpatient Medications on File Prior to Visit  Medication Sig Dispense Refill   acetaminophen (TYLENOL) 500 MG tablet Take 1,000 mg by mouth every 6 (six) hours as needed for mild pain.     Ascorbic Acid (VITAMIN C PO) Take 1 tablet by mouth daily.     digoxin (LANOXIN) 0.125 MG tablet Take 0.5 tablets (0.0625 mg total) by mouth daily. Please keep upcoming appt in July 2022 with Dr. Lovena Le before anymore refills. Thank you (Patient taking differently: Take 0.0625 mg by mouth every other day.) 45 tablet 0   estradiol (ESTRACE) 0.1 MG/GM vaginal cream Place 1 Applicatorful vaginally every other day.     febuxostat (ULORIC) 40 MG tablet Take 0.5 tablets (20 mg total) by mouth daily. 30 tablet 2   LORazepam (ATIVAN) 0.5 MG tablet TAKE 1 TABLET(0.5 MG) BY MOUTH AT BEDTIME 30 tablet 2   metoprolol tartrate (LOPRESSOR) 100 MG tablet Take 1 tablet (100 mg total) by mouth 2 (two) times daily. 180 tablet 2   pravastatin (PRAVACHOL) 20 MG tablet TAKE 1 TABLET(20 MG) BY MOUTH DAILY 90 tablet 1   Rivaroxaban (XARELTO) 15 MG TABS tablet TAKE 1 TABLET BY MOUTH DAILY WITH SUPPER 90 tablet 1   sodium  bicarbonate 650 MG tablet Take 650 mg by mouth daily.     No current facility-administered medications on file prior to visit.   Past Medical History:  Diagnosis Date   Acute gangrenous appendicitis with perforation and peritonitis 11/22/2011   Afib ([place]) 2008   CVA (cerebral infarction) 2008   no residual deficits   Osteoporosis    Past Surgical History:  Procedure Laterality Date   INSERT / REPLACE / REMOVE PACEMAKER     LAPAROSCOPIC APPENDECTOMY  11/22/2011   Procedure: APPENDECTOMY LAPAROSCOPIC;  Surgeon: Stark Klein, MD;  Location: [facility];  Service: General;  Laterality: N/A;   PACEMAKER INSERTION     PPM GENERATOR CHANGEOUT N/A 08/16/2017   Procedure: PPM GENERATOR CHANGEOUT;  Surgeon: Evans Lance, MD;  Location: [place] [facility];  Service: Cardiovascular;  Laterality: N/A;   RIGHT/LEFT HEART CATH AND CORONARY ANGIOGRAPHY N/A 02/01/2017   Procedure: Right/Left Heart Cath and Coronary Angiography;  Surgeon: Troy Sine, MD;  Location: [facility];  Service: Cardiovascular;  Laterality: N/A;   TONSILLECTOMY AND ADENOIDECTOMY  1935    Family History  Problem Relation Age of Onset   Coronary artery disease Mother    Heart attack Mother    Atrial fibrillation Father    Lung cancer Father    Alcohol abuse Brother    Failure to thrive Brother    Social History  Socioeconomic History   Marital status: Widowed    Spouse name: Not on file   Number of children: 3   Years of education: Not on file   Highest education level: Not on file  Occupational History   Occupation: Retired  Tobacco Use   Smoking status: Former    Types: Cigarettes    Quit date: 1975    Years since quitting: 48.6   Smokeless tobacco: Never  Vaping Use   Vaping Use: Never used  Substance and Sexual Activity   Alcohol use: No   Drug use: No   Sexual activity: Not Currently  Other Topics Concern   Not on file  Social History Narrative   Lives with dtr, moved to [place] from [place] in 2014, widowed early 2013 (64 years married)   Social Determinants of Radio broadcast assistant Strain: Not on file  Food Insecurity: Not on file  Transportation Needs: Not on file  Physical Activity: Not on file  Stress: Not on file  Social Connections: Not on file    Review of Systems  Constitutional:  Negative for chills, fatigue and fever.  HENT:  Negative for congestion, ear pain and sore throat.   Eyes:  Negative for visual disturbance.  Respiratory:  Negative for cough and shortness of breath.   Cardiovascular:  Negative for chest pain and palpitations.  Gastrointestinal:  Negative for abdominal pain, constipation, diarrhea, nausea and vomiting.  Endocrine: Negative for polydipsia, polyphagia and polyuria.  Genitourinary:  Negative for difficulty urinating and dysuria.  Musculoskeletal:  Negative for arthralgias, back pain and myalgias.  Skin:  Negative for rash.  Neurological:  Negative for headaches.  Psychiatric/Behavioral:  Negative for dysphoric mood. The patient is not nervous/anxious.      Objective:  BP 124/60   Pulse 68   Temp 98.3 F (36.8 C)   Resp 15   Ht 5' 1.5" (1.562 m)   Wt 123 lb (55.8 kg)   SpO2 98%   BMI 22.86 kg/m      02/10/2022   10:50 AM 02/03/2022    2:48 PM 11/21/2021   10:40 AM  BP/Weight  Systolic BP 267 124 580  Diastolic BP 60 72 68  Wt. (Lbs) 123 132 125  BMI 22.86 kg/m2 24.54 kg/m2 22.14 kg/m2    Physical Exam Vitals reviewed.  Constitutional:      General: She is not in acute distress.    Appearance: Normal appearance.  HENT:     Head: Normocephalic and atraumatic.     Right Ear: Tympanic membrane normal.     Left Ear: Tympanic membrane normal.     Nose: Nose normal.     Mouth/Throat:     Mouth: Mucous membranes are moist.     Pharynx: Oropharynx is clear.  Eyes:     Extraocular Movements: Extraocular movements intact.     Conjunctiva/sclera: Conjunctivae normal.     Pupils: Pupils are equal, round, and  reactive to light.  Cardiovascular:     Rate and Rhythm: Normal rate. Rhythm irregular.     Pulses: Normal pulses.     Heart sounds: No murmur heard.    No gallop.  Pulmonary:     Effort: Pulmonary effort is normal. No respiratory distress.     Breath sounds: Normal breath sounds. No wheezing.  Abdominal:     General: Abdomen is flat. Bowel sounds are normal. There is no distension.     Palpations: Abdomen is soft.     Tenderness:  There is no abdominal tenderness.  Musculoskeletal:        General: Normal range of motion.     Cervical back: Normal range of motion and neck supple.     Right lower leg: No edema.     Left lower leg: No edema.  Skin:    General: Skin is warm.     Capillary Refill: Capillary refill takes less than 2 seconds.  Neurological:     General: No focal deficit present.     Mental Status: She is alert and oriented to person, place, and time. Mental status is at baseline.     Cranial Nerves: No cranial nerve deficit.     Gait: Gait normal.  Psychiatric:        Mood and Affect: Mood normal.        Thought Content: Thought content normal.        Judgment: Judgment normal.         Lab Results  Component Value Date   WBC 8.7 11/21/2021   HGB 14.4 11/21/2021   HCT 44.0 11/21/2021   PLT 255 11/21/2021   GLUCOSE 101 (H) 08/06/2021   CHOL 158 08/06/2021   TRIG 138 08/06/2021   HDL 49 08/06/2021   LDLCALC 85 08/06/2021   ALT 7 08/06/2021   AST 16 08/06/2021   NA 138 08/06/2021   K 4.2 08/06/2021   CL 95 (L) 08/06/2021   CREATININE 1.78 (H) 08/06/2021   BUN 24 08/06/2021   CO2 24 08/06/2021   TSH 2.190 03/05/2017   INR 1.15 01/31/2017      Assessment & Plan:   Problem List Items Addressed This Visit       Cardiovascular and Mediastinum   Aortic atherosclerosis ([place]) - Primary (Chronic)   Relevant Medications   furosemide (LASIX) 20 MG tablet   Other Relevant Orders   Lipid panel Patient is on pravastatin    Chronic atrial fibrillation  (HCC)   Relevant Medications   furosemide (LASIX) 20 MG tablet   Other Relevant Orders   Comprehensive metabolic panel   CBC with Differential/Platelet Patient has a diagnosis of chronic atrial fibrillation.   Patient is on xarelto and has controlled ventricular response.  Patient is CV stable.     Chronic systolic CHF (congestive heart failure) (HCC)   Relevant Medications   furosemide (LASIX) 20 MG tablet   Other Relevant Orders   Digoxin level An individualized care plan was established and reinforced.  The patient's disease status was assessed using clinical finding son exam today, labs, and/or other diagnostic testing such as x-rays, to determine the patient's success in meeting treatmentgoalsbased on disease-based guidelines and found to beimproving. But not at goal yet. Medications prescriptions no changes Laboratory tests ordered to be performed today include routine. RECOMMENDATIONS: given include see cardiology.  Call physician is patient gains 3 lbs in one day or 5 lbs for one week.  Call for progressive PND, orthopnea or increased pedal edema.      Musculoskeletal and Integument   Age-related osteoporosis without current pathological fracture Patient is on calcium and vitamin D with estrace, patient understands risks    Acute idiopathic gout involving toe of right foot   Relevant Orders   Uric acid No recent gout while on medicines      Hematopoietic and Hemostatic   Acquired thrombophilia ([place]) Patient on xarelto for CAF     Other   Pacemaker Stable and working well    Insomnia Uses lorazepam PRN sleep  BMI 22.0-22.9, adult We discussed diet and exercise   Other Visit Diagnoses     Bilateral lower extremity edema       Relevant Medications   furosemide (LASIX) 20 MG tablet Continue on lasix for pedal edema, check labs     .  A total of 30 minutes were spent face-to-face with the patient during this encounter and over half of that time was spent on  counseling and coordination of care.  Discussed CHF.  Orders Placed This Encounter  Procedures   Comprehensive metabolic panel   Lipid panel   CBC with Differential/Platelet   Uric acid   Digoxin level     Follow-up: Return in about 3 months (around 05/13/2022).  An After Visit Summary was printed and given to the patient.  Reinaldo Meeker, MD [facility] [phone]

## 2022-02-11 ENCOUNTER — Other Ambulatory Visit: Payer: Self-pay | Admitting: Legal Medicine

## 2022-02-11 DIAGNOSIS — I5022 Chronic systolic (congestive) heart failure: Secondary | ICD-10-CM

## 2022-02-11 LAB — LIPID PANEL
Chol/HDL Ratio: 2.3 ratio (ref 0.0–4.4)
Cholesterol, Total: 152 mg/dL (ref 100–199)
HDL: 67 mg/dL (ref >39)
LDL Chol Calc (NIH): 68 mg/dL (ref 0–99)
Triglycerides: 93 mg/dL (ref 0–149)
VLDL Cholesterol Cal: 17 mg/dL (ref 5–40)

## 2022-02-11 LAB — CBC WITH DIFFERENTIAL/PLATELET
Basophils Absolute: 0.1 10*3/uL (ref 0.0–0.2)
Basos: 1 %
EOS (ABSOLUTE): 0.1 10*3/uL (ref 0.0–0.4)
Eos: 1 %
Hematocrit: 41.1 % (ref 34.0–46.6)
Hemoglobin: 13.5 g/dL (ref 11.1–15.9)
Immature Grans (Abs): 0.1 10*3/uL (ref 0.0–0.1)
Immature Granulocytes: 1 %
Lymphocytes Absolute: 2.2 10*3/uL (ref 0.7–3.1)
Lymphs: 23 %
MCH: 30.2 pg (ref 26.6–33.0)
MCHC: 32.8 g/dL (ref 31.5–35.7)
MCV: 92 fL (ref 79–97)
Monocytes Absolute: 0.9 10*3/uL (ref 0.1–0.9)
Monocytes: 9 %
Neutrophils Absolute: 6.3 10*3/uL (ref 1.4–7.0)
Neutrophils: 65 %
Platelets: 240 10*3/uL (ref 150–450)
RBC: 4.47 x10E6/uL (ref 3.77–5.28)
RDW: 13.7 % (ref 11.7–15.4)
WBC: 9.7 10*3/uL (ref 3.4–10.8)

## 2022-02-11 LAB — COMPREHENSIVE METABOLIC PANEL
ALT: 9 IU/L (ref 0–32)
AST: 18 IU/L (ref 0–40)
Albumin/Globulin Ratio: 1.5 (ref 1.2–2.2)
Albumin: 4.4 g/dL (ref 3.6–4.6)
Alkaline Phosphatase: 81 IU/L (ref 44–121)
BUN/Creatinine Ratio: 17 (ref 12–28)
BUN: 31 mg/dL (ref 10–36)
Bilirubin Total: 0.6 mg/dL (ref 0.0–1.2)
CO2: 27 mmol/L (ref 20–29)
Calcium: 9.8 mg/dL (ref 8.7–10.3)
Chloride: 94 mmol/L — ABNORMAL LOW (ref 96–106)
Creatinine, Ser: 1.81 mg/dL — ABNORMAL HIGH (ref 0.57–1.00)
Globulin, Total: 3 g/dL (ref 1.5–4.5)
Glucose: 89 mg/dL (ref 70–99)
Potassium: 4.3 mmol/L (ref 3.5–5.2)
Sodium: 137 mmol/L (ref 134–144)
Total Protein: 7.4 g/dL (ref 6.0–8.5)
eGFR: 26 mL/min/{1.73_m2} — ABNORMAL LOW (ref >59)

## 2022-02-11 LAB — CARDIOVASCULAR RISK ASSESSMENT

## 2022-02-11 LAB — DIGOXIN LEVEL: Digoxin, Serum: 1 ng/mL — ABNORMAL HIGH (ref 0.5–0.9)

## 2022-02-11 LAB — URIC ACID: Uric Acid: 7.2 mg/dL (ref 3.1–7.9)

## 2022-02-11 MED ORDER — DIGOXIN 125 MCG PO TABS: 0.0625 mg | ORAL_TABLET | ORAL | 0 refills | Status: DC

## 2022-02-11 NOTE — Progress Notes (Signed)
Kidney tests stage 4- has she seen nephrology in past?, liver tests normal, digoxin 1.0 slightly high, take digoxin every other day, cholesterol normal, cbc normal lp

## 2022-02-20 ENCOUNTER — Ambulatory Visit (INDEPENDENT_AMBULATORY_CARE_PROVIDER_SITE_OTHER): Payer: Medicare Other

## 2022-02-20 DIAGNOSIS — I495 Sick sinus syndrome: Secondary | ICD-10-CM | POA: Diagnosis not present

## 2022-02-20 LAB — CUP PACEART REMOTE DEVICE CHECK
Battery Remaining Longevity: 48 mo
Battery Remaining Percentage: 75 %
Brady Statistic RA Percent Paced: 0 %
Brady Statistic RV Percent Paced: 47 %
Date Time Interrogation Session: 20230818030100
Implantable Lead Implant Date: 20080827
Implantable Lead Implant Date: 20080827
Implantable Lead Location: 753859
Implantable Lead Location: 753860
Implantable Lead Model: 4135
Implantable Lead Model: 4136
Implantable Lead Serial Number: 28356762
Implantable Lead Serial Number: 28365470
Implantable Pulse Generator Implant Date: 20190211
Lead Channel Impedance Value: 599 Ohm
Lead Channel Pacing Threshold Amplitude: 0.7 V
Lead Channel Pacing Threshold Pulse Width: 0.4 ms
Lead Channel Setting Pacing Amplitude: 1.2 V
Lead Channel Setting Pacing Pulse Width: 0.4 ms
Lead Channel Setting Sensing Sensitivity: 2.5 mV
Pulse Gen Serial Number: 396317

## 2022-03-03 ENCOUNTER — Ambulatory Visit: Payer: Medicare Other | Admitting: Legal Medicine

## 2022-03-18 NOTE — Progress Notes (Signed)
Remote pacemaker transmission.   

## 2022-03-31 ENCOUNTER — Other Ambulatory Visit: Payer: Self-pay | Admitting: Family Medicine

## 2022-05-15 IMAGING — CR DG CHEST 2V
2 series · 2 of 2 positions shown · non-contrast
Comparison: 01/27/2017

CLINICAL DATA: Shortness of breath and fever for 3 days

EXAM:
CHEST - 2 VIEW

[chest pa]
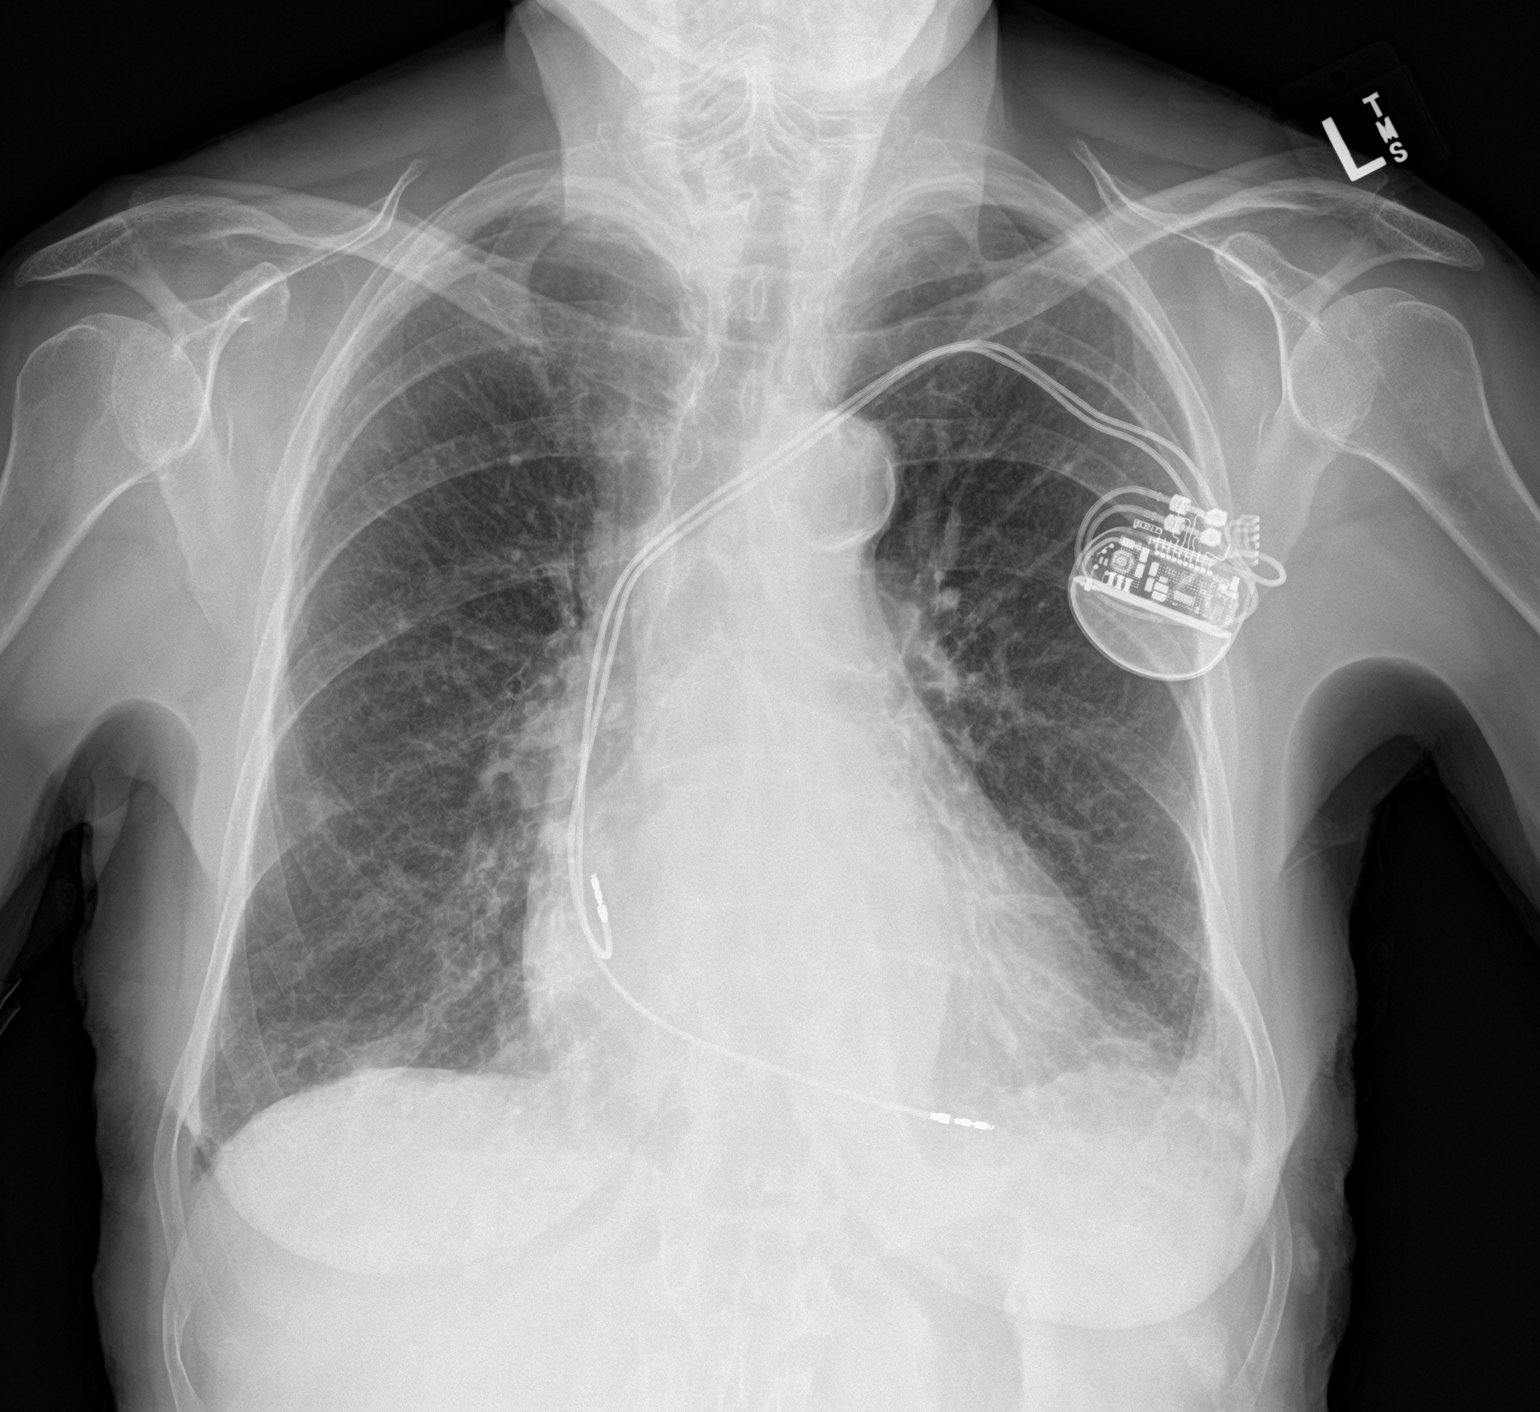

[chest lat]
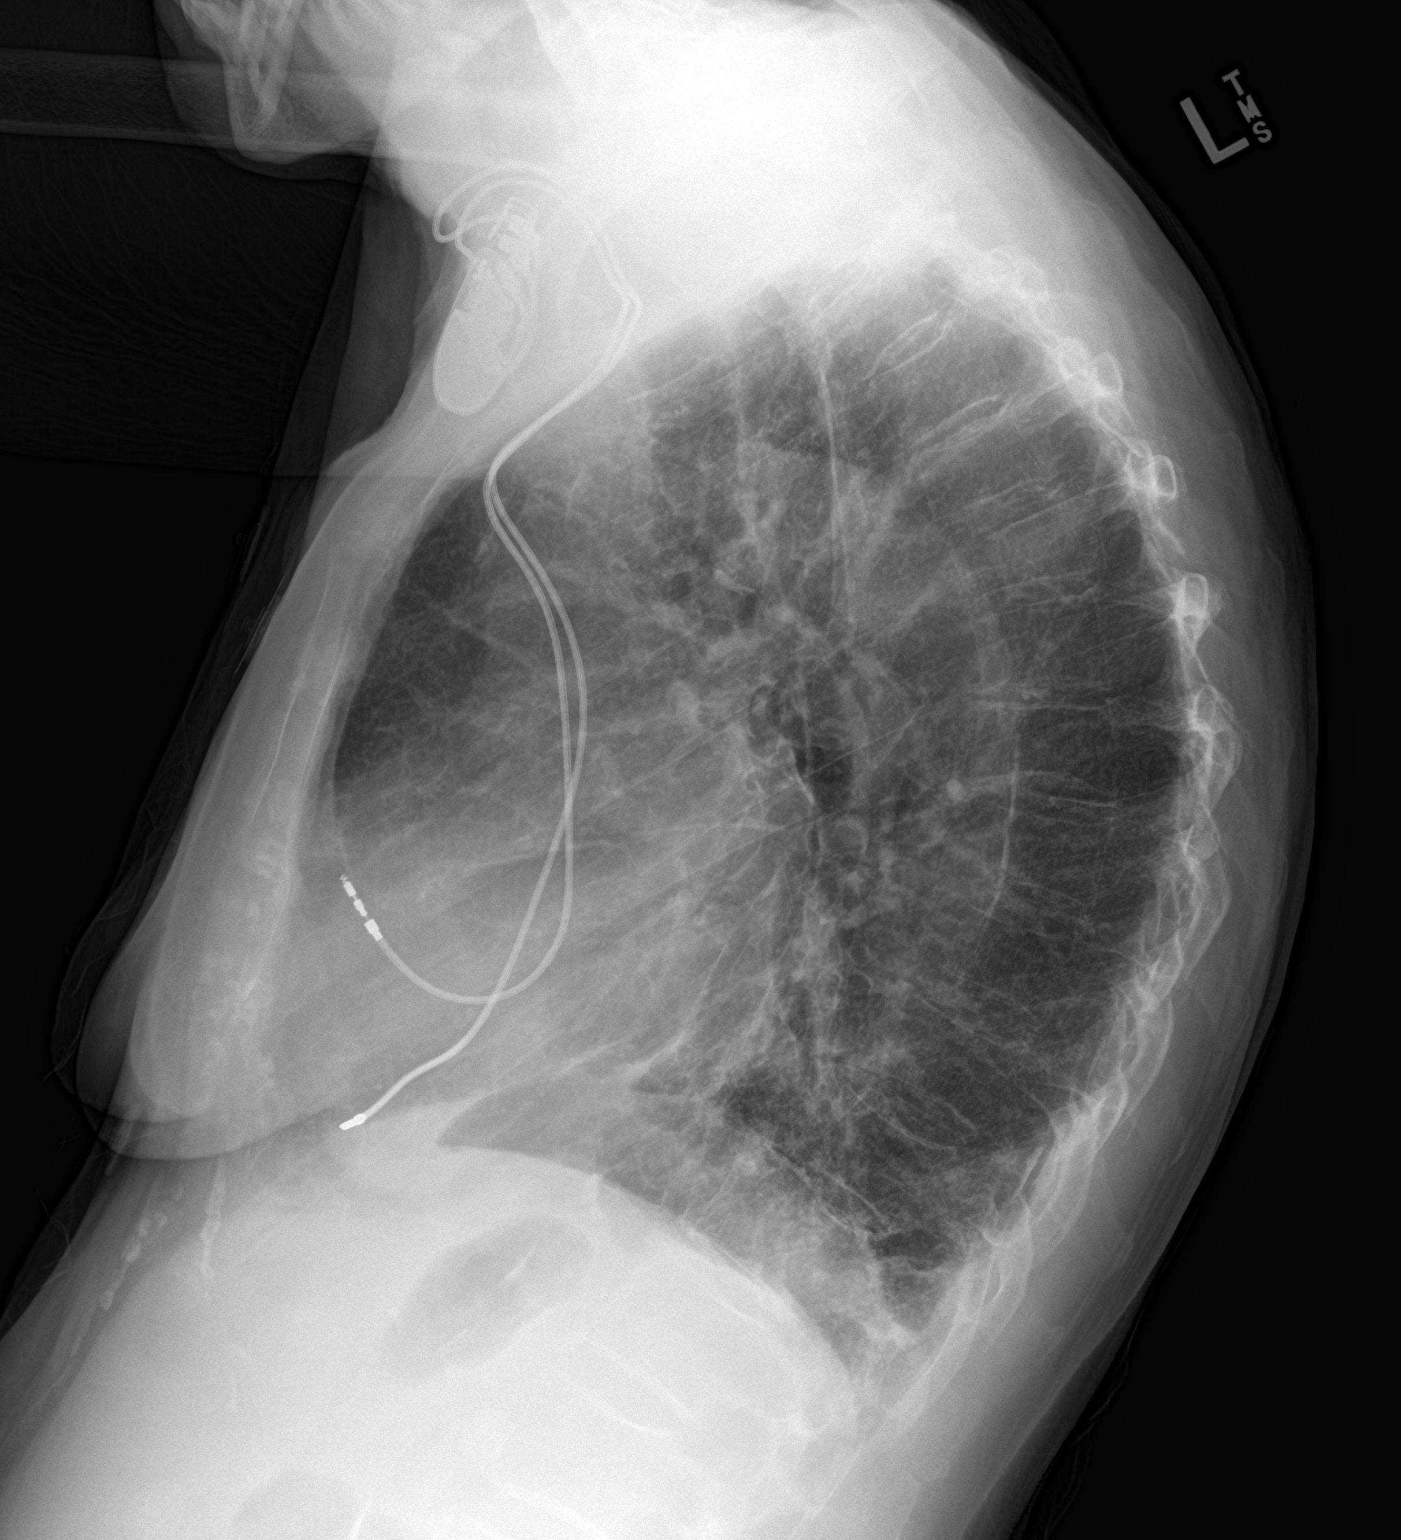

[2 of 2 positions shown; findings below may reference images not displayed]

FINDINGS: Streaky, indistinct density at the lung bases. A similar appearance
was seen on prior but not on abdominal CT 11/21/2011. Possible 7 mm
nodule in the right lower lung. Mildly enlarged heart size.
Dual-chamber pacer leads from the left in unremarkable position.
IMPRESSION: 1. Opacity at the bases that could be atelectasis, scarring, or
bronchopneumonia.
2. Equivocal 7 mm nodule at the right lung base. Recommend follow-up
after treatment to determine persistence.

## 2022-05-15 IMAGING — CT CT ABD-PELV W/O CM
2 of 4 series · 16 of 46 positions shown, 18 images · non-contrast
Comparison: November 21, 2011

CLINICAL DATA: Weakness, fever and chills.

EXAM:
CT ABDOMEN AND PELVIS WITHOUT CONTRAST
TECHNIQUE: Multidetector CT imaging of the abdomen and pelvis was performed
following the standard protocol without IV contrast.

[Series 3: ap without · axial · non-contrast · 0.72mm/px · z∈[-344,+11]mm · 13 of 81 slices shown, 15 images]
[im 5/81  soft-tissue]
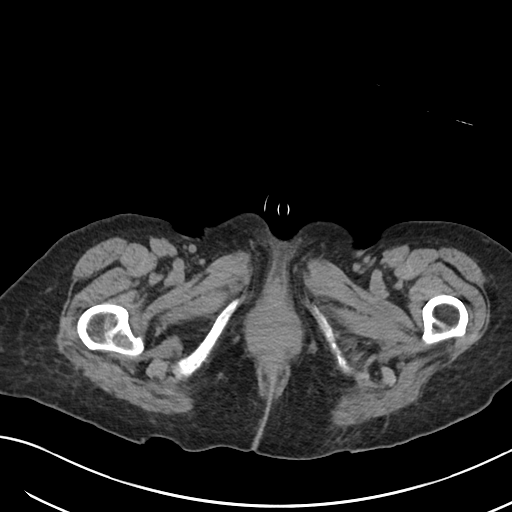
[im 5/81  bone]
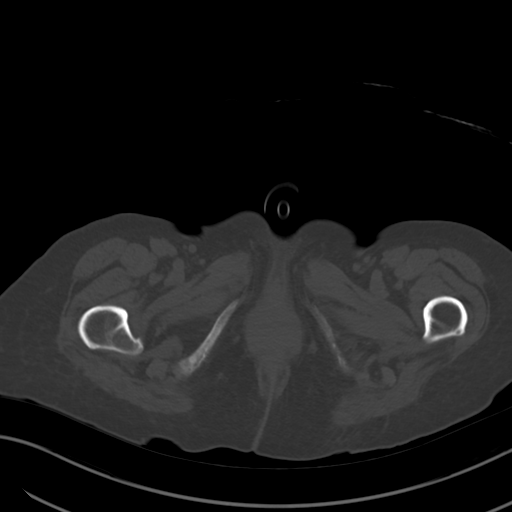
[im 9/81  soft-tissue]
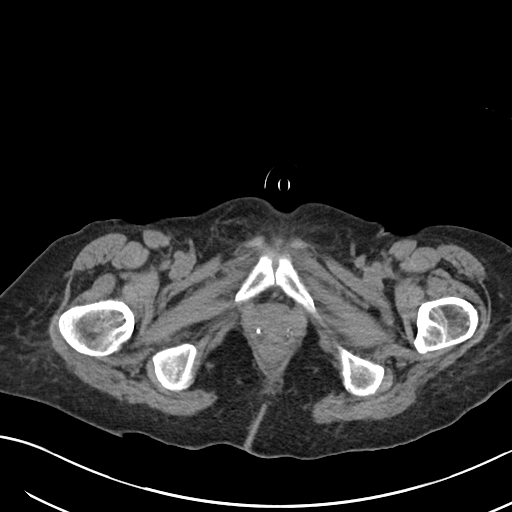
[im 18/81  soft-tissue]
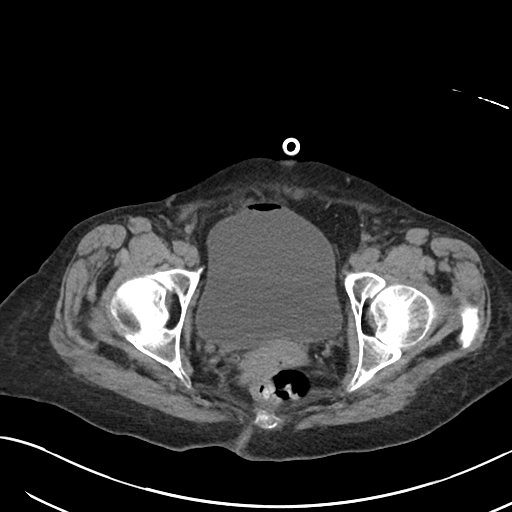
[im 23/81  soft-tissue]
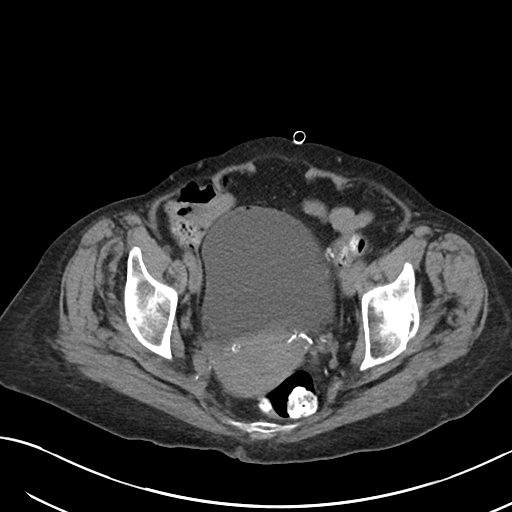
[im 27/81  soft-tissue]
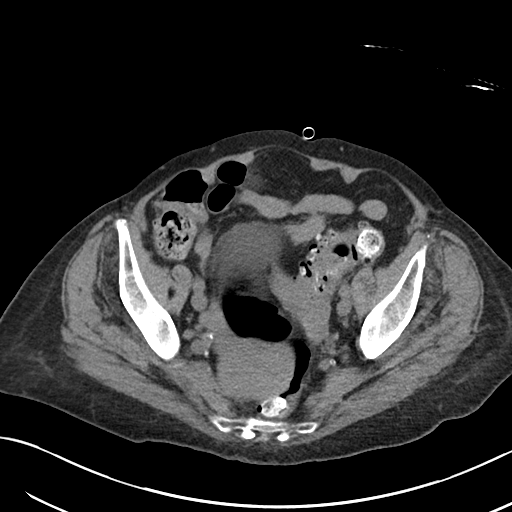
[im 36/81  soft-tissue]
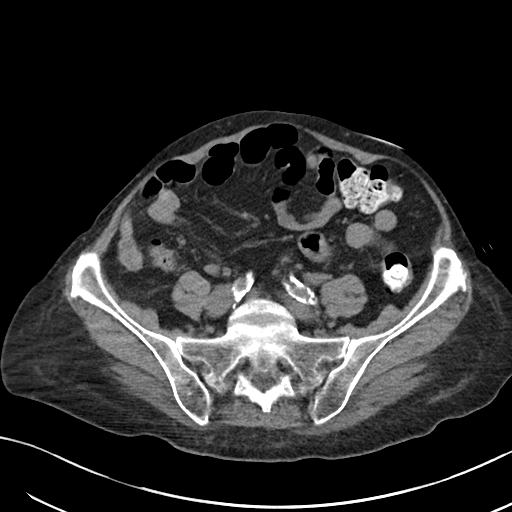
[im 41/81  soft-tissue]
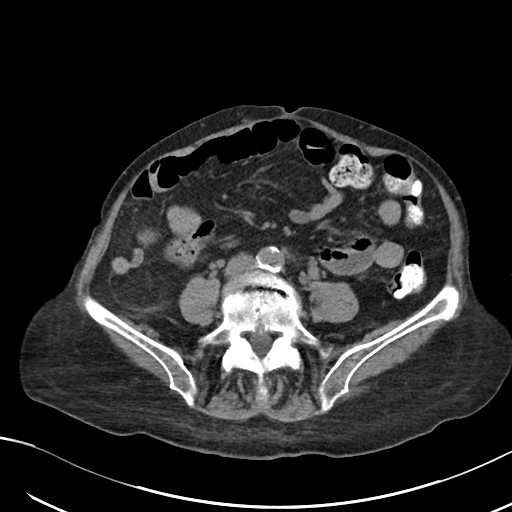
[im 45/81  soft-tissue]
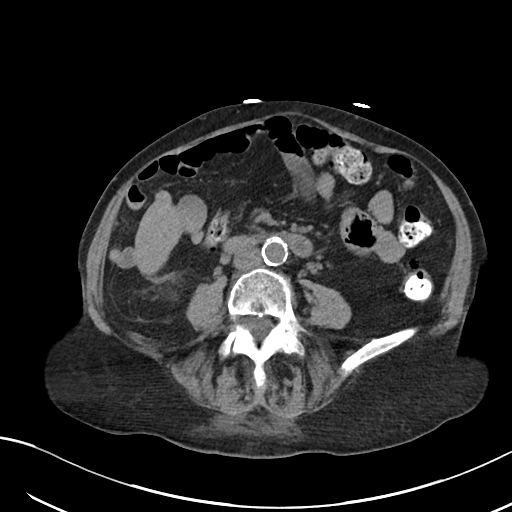
[im 54/81  soft-tissue]
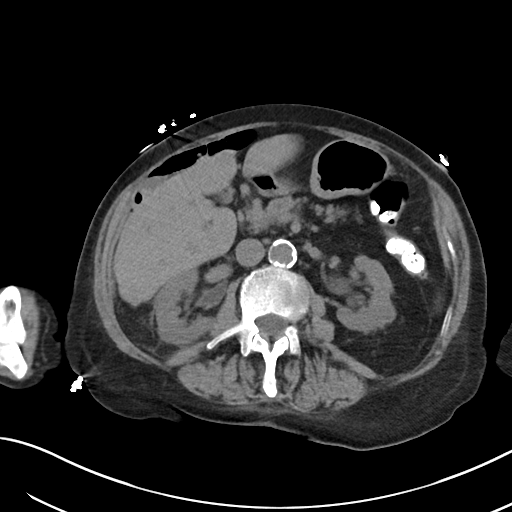
[im 54/81  bone]
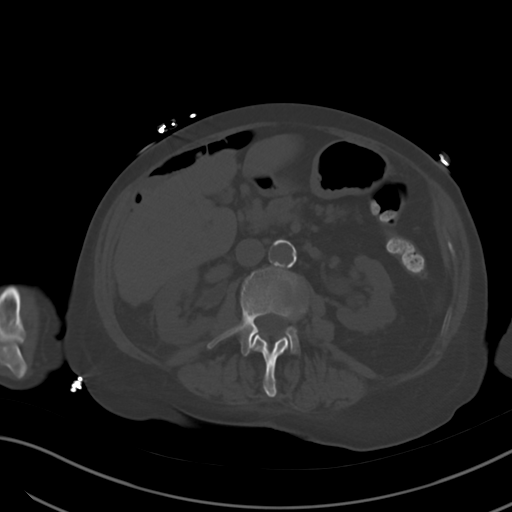
[im 58/81  soft-tissue]
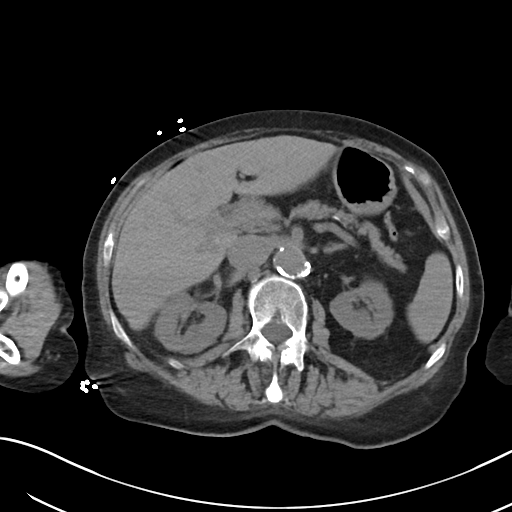
[im 63/81  soft-tissue]
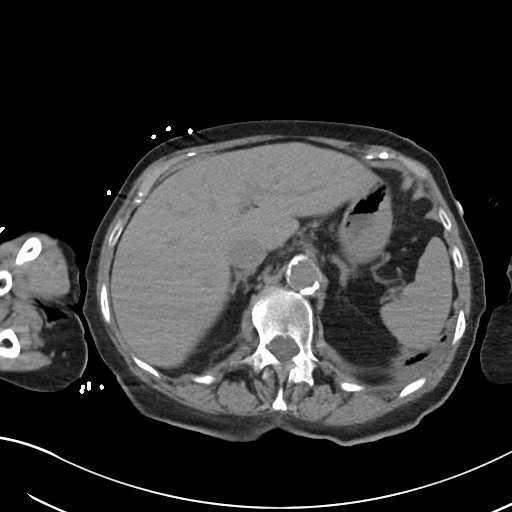
[im 72/81  soft-tissue]
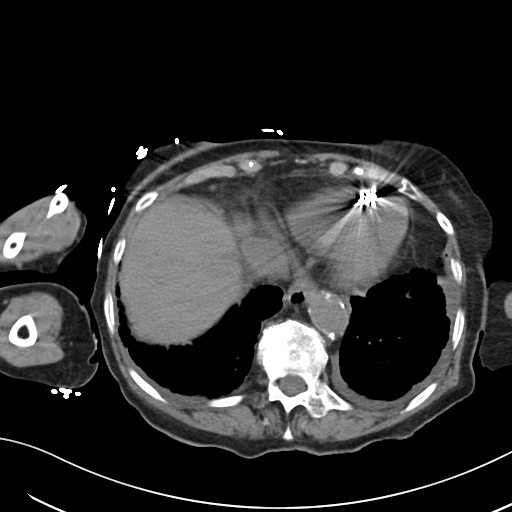
[im 76/81  soft-tissue]
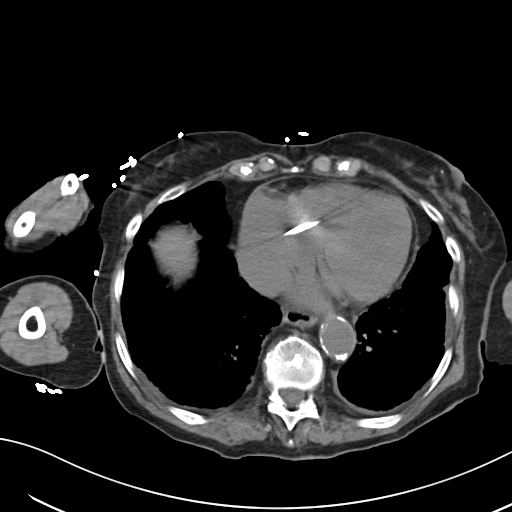

[Series 6: cor · coronal · 0.72mm/px · 3 of 85 slices shown]
[im 29/85  soft-tissue]
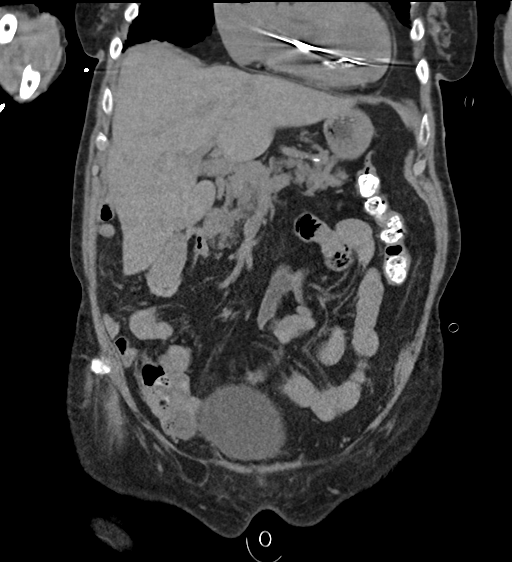
[im 38/85  soft-tissue]
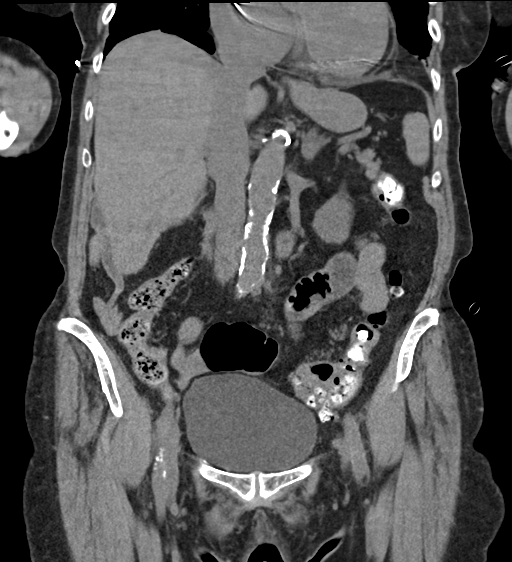
[im 47/85  soft-tissue]
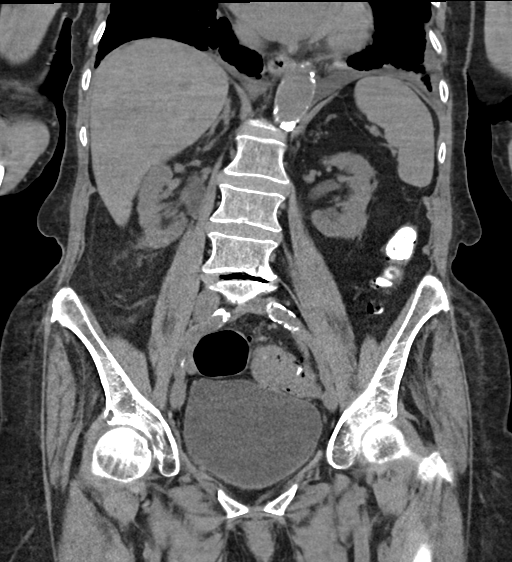

[16 of 46 positions shown; findings below may reference images not displayed]

FINDINGS: Lower chest: Mild to moderate severity areas of scarring and/or
atelectasis are seen within the bilateral lung bases.

Small bilateral pleural effusions are seen, left greater than right.

Hepatobiliary: No focal liver abnormality is seen. No gallstones,
gallbladder wall thickening, or biliary dilatation.

Pancreas: Unremarkable. No pancreatic ductal dilatation or
surrounding inflammatory changes.

Spleen: Normal in size without focal abnormality.

Adrenals/Urinary Tract: Adrenal glands are unremarkable. Kidneys are
normal in size, without focal lesions. There is mild bilateral
hydronephrosis without evidence of renal calculi. A mild amount of
air is seen within the lumen of a moderately distended urinary
bladder.

Stomach/Bowel: Stomach is within normal limits. The appendix is not
identified. No evidence of bowel dilatation. Diverticula are seen
within descending and sigmoid colon. Moderate to marked severity
thickening of the proximal to mid sigmoid colon is also seen.

Vascular/Lymphatic: Aortic atherosclerosis. No enlarged abdominal or
pelvic lymph nodes.

Reproductive: Uterus and bilateral adnexa are unremarkable.

Other: No abdominal wall hernia or abnormality. No abdominopelvic
ascites.

Musculoskeletal: Multilevel degenerative changes are seen throughout
the lumbar spine.
IMPRESSION: 1. Moderate to marked severity sigmoid colitis and/or
diverticulitis.
2. Small bilateral pleural effusions, left greater than right.
3. Mild bilateral hydronephrosis without evidence of renal calculi.
4. Small amount of air within the lumen of a moderately distended
urinary bladder. Correlation with recent instrumentation is
recommended.

Aortic Atherosclerosis ([id number]-B12.2).

## 2022-05-16 IMAGING — DX DG CHEST 1V PORT
1 series · 1 of 1 positions shown · non-contrast
Comparison: 04/16/2021

CLINICAL DATA: Hypoxia and shortness of breath.

EXAM:
PORTABLE CHEST 1 VIEW

[chest ap]
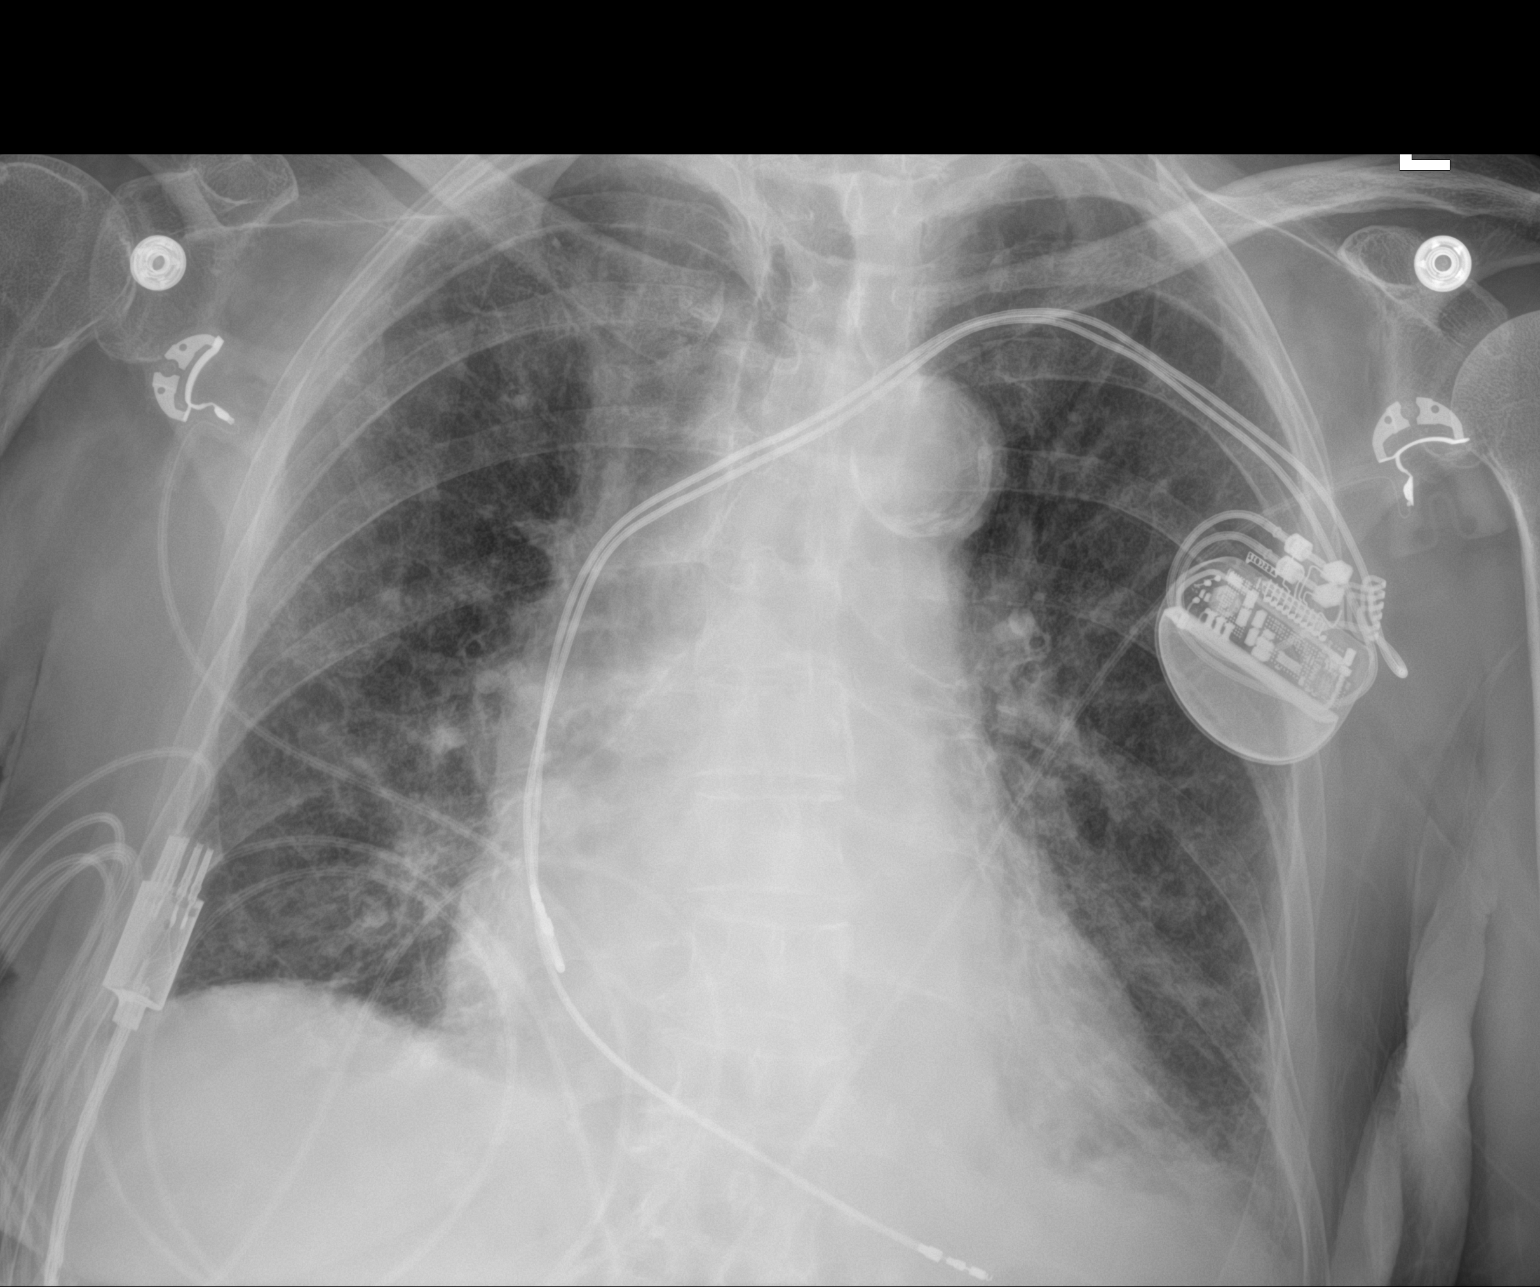

[1 of 1 positions shown; findings below may reference images not displayed]

FINDINGS: Cardiac pacemaker. Cardiac enlargement. Since the previous study,
there are increasing streaky perihilar and basilar infiltrates in
the lungs suggesting progressing edema or pneumonia. No pleural
effusions. No pneumothorax. Mediastinal contours appear intact.
Calcification of the aorta.
IMPRESSION: Increasing infiltration in the lung suggesting increasing edema or
pneumonia. Cardiac enlargement.

## 2022-05-22 ENCOUNTER — Ambulatory Visit (INDEPENDENT_AMBULATORY_CARE_PROVIDER_SITE_OTHER): Payer: Medicare Other

## 2022-05-22 DIAGNOSIS — I495 Sick sinus syndrome: Secondary | ICD-10-CM

## 2022-05-22 LAB — CUP PACEART REMOTE DEVICE CHECK
Battery Remaining Longevity: 42 mo
Battery Remaining Percentage: 72 %
Brady Statistic RA Percent Paced: 0 %
Brady Statistic RV Percent Paced: 23 %
Date Time Interrogation Session: 20231117052500
Implantable Lead Connection Status: 753985
Implantable Lead Connection Status: 753985
Implantable Lead Implant Date: 20080827
Implantable Lead Implant Date: 20080827
Implantable Lead Location: 753859
Implantable Lead Location: 753860
Implantable Lead Model: 4135
Implantable Lead Model: 4136
Implantable Lead Serial Number: 28356762
Implantable Lead Serial Number: 28365470
Implantable Pulse Generator Implant Date: 20190211
Lead Channel Impedance Value: 595 Ohm
Lead Channel Pacing Threshold Amplitude: 0.8 V
Lead Channel Pacing Threshold Pulse Width: 0.4 ms
Lead Channel Setting Pacing Amplitude: 1.2 V
Lead Channel Setting Pacing Pulse Width: 0.4 ms
Lead Channel Setting Sensing Sensitivity: 2.5 mV
Pulse Gen Serial Number: 396317
Zone Setting Status: 755011

## 2022-06-01 ENCOUNTER — Other Ambulatory Visit: Payer: Self-pay

## 2022-06-02 MED ORDER — FEBUXOSTAT 40 MG PO TABS: 20.0000 mg | ORAL_TABLET | Freq: Every day | ORAL | 2 refills | Status: DC

## 2022-06-10 NOTE — Progress Notes (Signed)
Remote pacemaker transmission.   

## 2022-06-16 ENCOUNTER — Other Ambulatory Visit: Payer: Self-pay

## 2022-06-16 MED ORDER — METOPROLOL TARTRATE 100 MG PO TABS: 100.0000 mg | ORAL_TABLET | Freq: Two times a day (BID) | ORAL | 2 refills | Status: DC

## 2022-06-17 ENCOUNTER — Ambulatory Visit (INDEPENDENT_AMBULATORY_CARE_PROVIDER_SITE_OTHER): Payer: Medicare Other | Admitting: Legal Medicine

## 2022-06-17 ENCOUNTER — Encounter: Payer: Self-pay | Admitting: Legal Medicine

## 2022-06-17 VITALS — BP 110/78 | HR 51 | Temp 97.4°F | Resp 15 | Ht 61.0 in | Wt 124.0 lb

## 2022-06-17 DIAGNOSIS — I482 Chronic atrial fibrillation, unspecified: Secondary | ICD-10-CM | POA: Diagnosis not present

## 2022-06-17 DIAGNOSIS — D6869 Other thrombophilia: Secondary | ICD-10-CM

## 2022-06-17 DIAGNOSIS — M84375S Stress fracture, left foot, sequela: Secondary | ICD-10-CM

## 2022-06-17 DIAGNOSIS — L97929 Non-pressure chronic ulcer of unspecified part of left lower leg with unspecified severity: Secondary | ICD-10-CM

## 2022-06-17 DIAGNOSIS — N184 Chronic kidney disease, stage 4 (severe): Secondary | ICD-10-CM

## 2022-06-17 DIAGNOSIS — I83029 Varicose veins of left lower extremity with ulcer of unspecified site: Secondary | ICD-10-CM | POA: Insufficient documentation

## 2022-06-17 DIAGNOSIS — I5022 Chronic systolic (congestive) heart failure: Secondary | ICD-10-CM | POA: Diagnosis not present

## 2022-06-17 DIAGNOSIS — N952 Postmenopausal atrophic vaginitis: Secondary | ICD-10-CM

## 2022-06-17 MED ORDER — ESTRADIOL 0.1 MG/GM VA CREA: 1.0000 | TOPICAL_CREAM | VAGINAL | 3 refills | Status: DC

## 2022-06-17 NOTE — Progress Notes (Signed)
Subjective:  Patient ID: Tracy Vasquez, female    DOB: Apr 14, 1930  Age: 88 y.o. MRN: [id number]  Chief Complaint  Patient presents with   Hospitalization Follow-up   Cellulitis   Acute Renal Failure    HPI  Patient was admitted on 05/17/2022 at [facility] and discharged on 05/20/2022. Cellulitis legs.  Venous incompetence. She was diagnosed with Acute Kidney Failure, Cellulitis on Left Lower limb and other specified Health status. She was discharged stable at home.  They sent her to home with Doxycycline 100 mg PO BID and she already finished. She saw podiatry and she is doing better after she developed a callus.  Current Outpatient Medications on File Prior to Visit  Medication Sig Dispense Refill   acetaminophen (TYLENOL) 500 MG tablet Take 1,000 mg by mouth every 6 (six) hours as needed for mild pain.     Ascorbic Acid (VITAMIN C PO) Take 1 tablet by mouth daily.     digoxin (LANOXIN) 0.125 MG tablet Take 0.5 tablets (0.0625 mg total) by mouth every other day. Please keep upcoming appt in July 2022 with Dr. Lovena Le before anymore refills. Thank you 45 tablet 0   febuxostat (ULORIC) 40 MG tablet Take 0.5 tablets (20 mg total) by mouth daily. 30 tablet 2   furosemide (LASIX) 20 MG tablet Take 1 tablet (20 mg total) by mouth in the morning, at noon, and at bedtime. 90 tablet 3   LORazepam (ATIVAN) 0.5 MG tablet TAKE 1 TABLET(0.5 MG) BY MOUTH AT BEDTIME 30 tablet 2   metoprolol tartrate (LOPRESSOR) 100 MG tablet Take 1 tablet (100 mg total) by mouth 2 (two) times daily. 180 tablet 2   pravastatin (PRAVACHOL) 20 MG tablet TAKE 1 TABLET(20 MG) BY MOUTH DAILY 90 tablet 1   Rivaroxaban (XARELTO) 15 MG TABS tablet TAKE 1 TABLET BY MOUTH DAILY WITH SUPPER 90 tablet 1   sodium bicarbonate 650 MG tablet Take 650 mg by mouth daily.     No current facility-administered medications on file prior to visit.   Past Medical History:  Diagnosis Date   Acute gangrenous appendicitis with  perforation and peritonitis 11/22/2011   Afib ([place]) 2008   CVA (cerebral infarction) 2008   no residual deficits   Osteoporosis    Past Surgical History:  Procedure Laterality Date   INSERT / REPLACE / REMOVE PACEMAKER     LAPAROSCOPIC APPENDECTOMY  11/22/2011   Procedure: APPENDECTOMY LAPAROSCOPIC;  Surgeon: Stark Klein, MD;  Location: [facility];  Service: General;  Laterality: N/A;   PACEMAKER INSERTION     PPM GENERATOR CHANGEOUT N/A 08/16/2017   Procedure: PPM GENERATOR CHANGEOUT;  Surgeon: Evans Lance, MD;  Location: [place] [facility];  Service: Cardiovascular;  Laterality: N/A;   RIGHT/LEFT HEART CATH AND CORONARY ANGIOGRAPHY N/A 02/01/2017   Procedure: Right/Left Heart Cath and Coronary Angiography;  Surgeon: Troy Sine, MD;  Location: [place] [facility];  Service: Cardiovascular;  Laterality: N/A;   TONSILLECTOMY AND ADENOIDECTOMY  1935    Family History  Problem Relation Age of Onset   Coronary artery disease Mother    Heart attack Mother    Atrial fibrillation Father    Lung cancer Father    Alcohol abuse Brother    Failure to thrive Brother    Social History   Socioeconomic History   Marital status: Widowed    Spouse name: Not on file   Number of children: 3   Years of education: Not on file   Highest  education level: Not on file  Occupational History   Occupation: Retired  Tobacco Use   Smoking status: Former    Types: Cigarettes    Quit date: 1975    Years since quitting: 48.9   Smokeless tobacco: Never  Vaping Use   Vaping Use: Never used  Substance and Sexual Activity   Alcohol use: No   Drug use: No   Sexual activity: Not Currently  Other Topics Concern   Not on file  Social History Narrative   Lives with dtr, moved to [place] from [place] in 2014, widowed early 2013 (9 years married)   Social Determinants of Radio broadcast assistant Strain: Not on file  Food Insecurity: Not on file  Transportation Needs: Not on file  Physical  Activity: Not on file  Stress: Not on file  Social Connections: Not on file    Review of Systems  Constitutional:  Negative for activity change and appetite change.  HENT:  Negative for congestion.   Eyes:  Negative for visual disturbance.  Respiratory:  Negative for chest tightness and shortness of breath.   Cardiovascular:  Negative for chest pain and palpitations.  Gastrointestinal: Negative.  Negative for abdominal distention and abdominal pain.  Endocrine: Negative for polyuria.  Genitourinary:  Negative for difficulty urinating and dyspareunia.  Musculoskeletal: Negative.   Neurological: Negative.   Psychiatric/Behavioral: Negative.       Objective:  BP 110/78   Pulse (!) 51   Temp (!) 97.4 F (36.3 C)   Resp 15   Ht 5\' 1"  (1.549 m)   Wt 124 lb (56.2 kg)   SpO2 95%   BMI 23.43 kg/m      06/17/2022    3:40 PM 02/10/2022   10:50 AM 02/03/2022    2:48 PM  BP/Weight  Systolic BP 616 073 710  Diastolic BP 78 60 72  Wt. (Lbs) 124 123 132  BMI 23.43 kg/m2 22.86 kg/m2 24.54 kg/m2    Physical Exam Vitals reviewed.  Constitutional:      General: She is not in acute distress.    Appearance: Normal appearance.  HENT:     Head: Normocephalic.     Mouth/Throat:     Mouth: Mucous membranes are moist.     Pharynx: Oropharynx is clear.  Eyes:     Conjunctiva/sclera: Conjunctivae normal.     Pupils: Pupils are equal, round, and reactive to light.  Cardiovascular:     Rate and Rhythm: Normal rate and regular rhythm.     Pulses: Normal pulses.     Heart sounds: Normal heart sounds. No murmur heard.    No gallop.  Pulmonary:     Effort: Pulmonary effort is normal. No respiratory distress.     Breath sounds: Normal breath sounds. No wheezing.  Abdominal:     General: Abdomen is flat. Bowel sounds are normal. There is no distension.     Palpations: Abdomen is soft.     Tenderness: There is no abdominal tenderness.  Musculoskeletal:     Cervical back: Normal range of  motion.  Skin:    General: Skin is warm and dry.     Comments: Small ulcer right leg  Neurological:     General: No focal deficit present.     Mental Status: She is alert and oriented to person, place, and time.         Lab Results  Component Value Date   WBC 9.7 02/10/2022   HGB 13.5 02/10/2022  HCT 41.1 02/10/2022   PLT 240 02/10/2022   GLUCOSE 89 02/10/2022   CHOL 152 02/10/2022   TRIG 93 02/10/2022   HDL 67 02/10/2022   LDLCALC 68 02/10/2022   ALT 9 02/10/2022   AST 18 02/10/2022   NA 137 02/10/2022   K 4.3 02/10/2022   CL 94 (L) 02/10/2022   CREATININE 1.81 (H) 02/10/2022   BUN 31 02/10/2022   CO2 27 02/10/2022   TSH 2.190 03/05/2017   INR 1.15 01/31/2017      Assessment & Plan:   Problem List Items Addressed This Visit       Cardiovascular and Mediastinum   Chronic atrial fibrillation [facility]) Patient has a diagnosis of chronic atrial fibrillation.   Patient is on xarelto and has controlled ventricular response.  Patient is CV stable.     Chronic systolic CHF (congestive heart failure) (HCC) - Primary   Relevant Orders   Comprehensive metabolic panel An individualized care plan was established and reinforced.  The patient's disease status was assessed using clinical finding son exam today, labs, and/or other diagnostic testing such as x-rays, to determine the patient's success in meeting treatmentgoalsbased on disease-based guidelines and found to beimproving. But not at goal yet. Medications prescriptions no cha nges Laboratory tests ordered to be performed today include routine labs. RECOMMENDATIONS: given include see cardiology.  Call physician is patient gains 3 lbs in one day or 5 lbs for one week.  Call for progressive PND, orthopnea or increased pedal edema.     Ulcerated leg varices, left (HCC) Left leg ulcers have healed     Musculoskeletal and Integument   Fracture, stress, metatarsal, left, sequela The left metatarsal fracture has healed, she  is wearing fracture shoe since it is comfortable     Genitourinary   Chronic kidney disease, stage 4 (severe) ([place]) Patient was evaluated for stage 4.  It is important to maintain good Blood Pressure control . Keep on low protein diet and remain with adequate hydration to maintain function. Sees nephrology     Hematopoietic and Hemostatic   Acquired thrombophilia ([place]) Patient on chronic xarelto   Other Visit Diagnoses     Atrophic vaginitis       Relevant Medications   estradiol (ESTRACE) 0.1 MG/GM vaginal cream Renew creasm for atrophic vaginitis     .    Orders Placed This Encounter  Procedures   Comprehensive metabolic panel     Follow-up: Return in about 4 months (around 10/17/2022).  An After Visit Summary was printed and given to the patient.  Reinaldo Meeker, MD [facility] [phone]

## 2022-06-22 ENCOUNTER — Other Ambulatory Visit: Payer: Self-pay | Admitting: Legal Medicine

## 2022-06-22 DIAGNOSIS — F5101 Primary insomnia: Secondary | ICD-10-CM

## 2022-06-28 ENCOUNTER — Other Ambulatory Visit: Payer: Self-pay | Admitting: Internal Medicine

## 2022-06-28 DIAGNOSIS — I482 Chronic atrial fibrillation, unspecified: Secondary | ICD-10-CM

## 2022-07-09 ENCOUNTER — Ambulatory Visit: Payer: Medicare Other | Admitting: Family Medicine

## 2022-07-09 ENCOUNTER — Encounter: Payer: Self-pay | Admitting: Family Medicine

## 2022-07-09 VITALS — BP 108/62 | HR 58 | Temp 97.4°F | Ht 61.5 in | Wt 122.0 lb

## 2022-07-09 DIAGNOSIS — R600 Localized edema: Secondary | ICD-10-CM

## 2022-07-09 DIAGNOSIS — L03115 Cellulitis of right lower limb: Secondary | ICD-10-CM | POA: Diagnosis not present

## 2022-07-09 MED ORDER — CEPHALEXIN 250 MG PO CAPS: 250.0000 mg | ORAL_CAPSULE | Freq: Three times a day (TID) | ORAL | 0 refills | Status: DC

## 2022-07-09 NOTE — Progress Notes (Signed)
Acute Office Visit  Subjective:    Patient ID: Tracy Vasquez, female    DOB: 1929/10/26, 89 y.o.   MRN: [id number]  Chief Complaint  Patient presents with   Right foot edema    HPI Patient is in today for right foot swelling been going on for 3 to 4 days.  Patient has a history of bilateral pedal edema and is currently on Lasix.  Lasix 20 mg 2 oral twice daily and increased to 2 in am and 3 in pm about one month ago. Is not on potassium.  Patient is in chronic kidney disease stage IV and has had issues with hyperkalemia.    Past Medical History:  Diagnosis Date   Acute gangrenous appendicitis with perforation and peritonitis 11/22/2011   Afib ([place]) 2008   CVA (cerebral infarction) 2008   no residual deficits   Osteoporosis     Past Surgical History:  Procedure Laterality Date   INSERT / REPLACE / REMOVE PACEMAKER     LAPAROSCOPIC APPENDECTOMY  11/22/2011   Procedure: APPENDECTOMY LAPAROSCOPIC;  Surgeon: Stark Klein, MD;  Location: [facility];  Service: General;  Laterality: N/A;   PACEMAKER INSERTION     PPM GENERATOR CHANGEOUT N/A 08/16/2017   Procedure: PPM GENERATOR CHANGEOUT;  Surgeon: Evans Lance, MD;  Location: [facility];  Service: Cardiovascular;  Laterality: N/A;   RIGHT/LEFT HEART CATH AND CORONARY ANGIOGRAPHY N/A 02/01/2017   Procedure: Right/Left Heart Cath and Coronary Angiography;  Surgeon: Troy Sine, MD;  Location: [facility];  Service: Cardiovascular;  Laterality: N/A;   TONSILLECTOMY AND ADENOIDECTOMY  1935    Family History  Problem Relation Age of Onset   Coronary artery disease Mother    Heart attack Mother    Atrial fibrillation Father    Lung cancer Father    Alcohol abuse Brother    Failure to thrive Brother     Social History   Socioeconomic History   Marital status: Widowed    Spouse name: Not on file   Number of children: 3   Years of education: Not on file   Highest education level: Not on file  Occupational History    Occupation: Retired  Tobacco Use   Smoking status: Former    Types: Cigarettes    Quit date: 1975    Years since quitting: 49.0   Smokeless tobacco: Never  Vaping Use   Vaping Use: Never used  Substance and Sexual Activity   Alcohol use: No   Drug use: No   Sexual activity: Not Currently  Other Topics Concern   Not on file  Social History Narrative   Lives with dtr, moved to [place] from [place] in 2014, widowed early 2013 (30 years married)   Social Determinants of Radio broadcast assistant Strain: Not on file  Food Insecurity: Not on file  Transportation Needs: Not on file  Physical Activity: Not on file  Stress: Not on file  Social Connections: Not on file  Intimate Partner Violence: Not on file    Outpatient Medications Prior to Visit  Medication Sig Dispense Refill   acetaminophen (TYLENOL) 500 MG tablet Take 1,000 mg by mouth every 6 (six) hours as needed for mild pain.     Ascorbic Acid (VITAMIN C PO) Take 1 tablet by mouth daily.     digoxin (LANOXIN) 0.125 MG tablet Take 0.5 tablets (0.0625 mg total) by mouth every other day. Please keep upcoming appt in July 2022 with Dr.  Lovena Le before anymore refills. Thank you 45 tablet 0   estradiol (ESTRACE) 0.1 MG/GM vaginal cream Place 1 Applicatorful vaginally every other day. 42.5 g 3   febuxostat (ULORIC) 40 MG tablet Take 0.5 tablets (20 mg total) by mouth daily. 30 tablet 2   LORazepam (ATIVAN) 0.5 MG tablet TAKE 1 TABLET(0.5 MG) BY MOUTH AT BEDTIME 30 tablet 3   metoprolol tartrate (LOPRESSOR) 100 MG tablet Take 1 tablet (100 mg total) by mouth 2 (two) times daily. 180 tablet 2   pravastatin (PRAVACHOL) 20 MG tablet TAKE 1 TABLET(20 MG) BY MOUTH DAILY 90 tablet 1   sodium bicarbonate 650 MG tablet Take 650 mg by mouth daily.     XARELTO 15 MG TABS tablet TAKE 1 TABLET BY MOUTH DAILY WITH SUPPER 90 tablet 1   furosemide (LASIX) 20 MG tablet Take 1 tablet (20 mg total) by mouth in the morning, at noon, and at  bedtime. 90 tablet 3   No facility-administered medications prior to visit.    No Known Allergies  Review of Systems  Constitutional:  Negative for appetite change, fatigue and fever.  HENT:  Negative for congestion, ear pain, sinus pressure and sore throat.   Respiratory:  Negative for cough, chest tightness, shortness of breath and wheezing.   Cardiovascular:  Negative for chest pain and palpitations.  Gastrointestinal:  Negative for abdominal pain, constipation, diarrhea, nausea and vomiting.  Genitourinary:  Negative for dysuria and hematuria.  Musculoskeletal:  Positive for myalgias (Right foot pain with swelling). Negative for arthralgias, back pain and joint swelling.  Skin:  Negative for rash.  Neurological:  Negative for dizziness, weakness and headaches.  Psychiatric/Behavioral:  Negative for dysphoric mood. The patient is not nervous/anxious.        Objective:    Physical Exam Vitals reviewed.  Constitutional:      Appearance: Normal appearance. She is normal weight.  Cardiovascular:     Rate and Rhythm: Normal rate and regular rhythm.     Heart sounds: Normal heart sounds.  Pulmonary:     Effort: Pulmonary effort is normal.     Breath sounds: Normal breath sounds.  Abdominal:     General: Abdomen is flat. Bowel sounds are normal.     Palpations: Abdomen is soft.  Musculoskeletal:     Right lower leg: Edema (rt foot and lower leg. no weeping, erythema. tender.) present.     Left lower leg: No edema.  Neurological:     Mental Status: She is alert and oriented to person, place, and time.  Psychiatric:        Mood and Affect: Mood normal.        Behavior: Behavior normal.     BP 108/62 (BP Location: Left Arm, Patient Position: Sitting)   Pulse (!) 58   Temp (!) 97.4 F (36.3 C) (Temporal)   Ht 5' 1.5" (1.562 m)   Wt 122 lb (55.3 kg)   SpO2 98%   BMI 22.68 kg/m  Wt Readings from Last 3 Encounters:  07/09/22 122 lb (55.3 kg)  06/17/22 124 lb (56.2 kg)   02/10/22 123 lb (55.8 kg)    Health Maintenance Due  Topic Date Due   Medicare Annual Wellness (AWV)  Never done   DTaP/Tdap/Td (1 - Tdap) Never done   Zoster Vaccines- Shingrix (1 of 2) Never done   INFLUENZA VACCINE  02/03/2022   COVID-19 Vaccine (3 - 2023-24 season) 03/06/2022    There are no preventive care reminders to display for  this patient.   Lab Results  Component Value Date   TSH 2.190 03/05/2017   Lab Results  Component Value Date   WBC 8.1 07/09/2022   HGB 13.3 07/09/2022   HCT 39.5 07/09/2022   MCV 91 07/09/2022   PLT 257 07/09/2022   Lab Results  Component Value Date   NA 138 07/09/2022   K 4.4 07/09/2022   CO2 31 (H) 07/09/2022   GLUCOSE 84 07/09/2022   BUN 28 07/09/2022   CREATININE 1.60 (H) 07/09/2022   BILITOT 0.6 07/09/2022   ALKPHOS 92 07/09/2022   AST 14 07/09/2022   ALT 8 07/09/2022   PROT 7.3 07/09/2022   ALBUMIN 4.4 07/09/2022   CALCIUM 9.8 07/09/2022   ANIONGAP 9 04/19/2021   EGFR 30 (L) 07/09/2022   GFR 35.27 (L) 04/25/2014   Lab Results  Component Value Date   CHOL 152 02/10/2022   Lab Results  Component Value Date   HDL 67 02/10/2022   Lab Results  Component Value Date   LDLCALC 68 02/10/2022   Lab Results  Component Value Date   TRIG 93 02/10/2022   Lab Results  Component Value Date   CHOLHDL 2.3 02/10/2022   No results found for: "HGBA1C"       Assessment & Plan:   Problem List Items Addressed This Visit       Other   Pedal edema    Continue increased dose of lasix.  Elevate leg. Avoid salt.  'Consider change to torsemide next week. Conversion is torsemide 1 mg to furosemide 2-4 mg. Likely change lasix to torsemide 20 mg twice daily.       Relevant Medications   furosemide (LASIX) 20 MG tablet   Other Relevant Orders   Comprehensive metabolic panel (Completed)   Cellulitis of right leg - Primary    Prescription keflex renal adjusted  Check labs.  Elevate leg.       Relevant Orders   CBC with  Differential/Platelet (Completed)   Comprehensive metabolic panel (Completed)   Uric acid (Completed)   Other Visit Diagnoses     Bilateral lower extremity edema       Relevant Medications   furosemide (LASIX) 20 MG tablet        Meds ordered this encounter  Medications   cephALEXin (KEFLEX) 250 MG capsule    Sig: Take 1 capsule (250 mg total) by mouth 3 (three) times daily.    Dispense:  21 capsule    Refill:  0   furosemide (LASIX) 20 MG tablet    Sig: 2 in am and 3 in pm.    Dispense:  1 tablet    Refill:  0    I,Lauren M Auman,acting as a scribe for Rochel Brome, MD.,have documented all relevant documentation on the behalf of Rochel Brome, MD,as directed by  Rochel Brome, MD while in the presence of Rochel Brome, MD.   Rochel Brome, MD

## 2022-07-10 LAB — CBC WITH DIFFERENTIAL/PLATELET
Basophils Absolute: 0.1 10*3/uL (ref 0.0–0.2)
Basos: 1 %
EOS (ABSOLUTE): 0.1 10*3/uL (ref 0.0–0.4)
Eos: 1 %
Hematocrit: 39.5 % (ref 34.0–46.6)
Hemoglobin: 13.3 g/dL (ref 11.1–15.9)
Immature Grans (Abs): 0 10*3/uL (ref 0.0–0.1)
Immature Granulocytes: 0 %
Lymphocytes Absolute: 2 10*3/uL (ref 0.7–3.1)
Lymphs: 25 %
MCH: 30.7 pg (ref 26.6–33.0)
MCHC: 33.7 g/dL (ref 31.5–35.7)
MCV: 91 fL (ref 79–97)
Monocytes Absolute: 0.8 10*3/uL (ref 0.1–0.9)
Monocytes: 10 %
Neutrophils Absolute: 5.1 10*3/uL (ref 1.4–7.0)
Neutrophils: 63 %
Platelets: 257 10*3/uL (ref 150–450)
RBC: 4.33 x10E6/uL (ref 3.77–5.28)
RDW: 12.1 % (ref 11.7–15.4)
WBC: 8.1 10*3/uL (ref 3.4–10.8)

## 2022-07-10 LAB — COMPREHENSIVE METABOLIC PANEL
ALT: 8 IU/L (ref 0–32)
AST: 14 IU/L (ref 0–40)
Albumin/Globulin Ratio: 1.5 (ref 1.2–2.2)
Albumin: 4.4 g/dL (ref 3.6–4.6)
Alkaline Phosphatase: 92 IU/L (ref 44–121)
BUN/Creatinine Ratio: 18 (ref 12–28)
BUN: 28 mg/dL (ref 10–36)
Bilirubin Total: 0.6 mg/dL (ref 0.0–1.2)
CO2: 31 mmol/L — ABNORMAL HIGH (ref 20–29)
Calcium: 9.8 mg/dL (ref 8.7–10.3)
Chloride: 93 mmol/L — ABNORMAL LOW (ref 96–106)
Creatinine, Ser: 1.6 mg/dL — ABNORMAL HIGH (ref 0.57–1.00)
Globulin, Total: 2.9 g/dL (ref 1.5–4.5)
Glucose: 84 mg/dL (ref 70–99)
Potassium: 4.4 mmol/L (ref 3.5–5.2)
Sodium: 138 mmol/L (ref 134–144)
Total Protein: 7.3 g/dL (ref 6.0–8.5)
eGFR: 30 mL/min/{1.73_m2} — ABNORMAL LOW (ref >59)

## 2022-07-10 LAB — URIC ACID: Uric Acid: 6.6 mg/dL (ref 3.1–7.9)

## 2022-07-14 ENCOUNTER — Ambulatory Visit: Payer: Medicare Other | Admitting: Nurse Practitioner

## 2022-07-14 MED ORDER — FUROSEMIDE 20 MG PO TABS: ORAL_TABLET | ORAL | 0 refills | Status: AC

## 2022-07-14 NOTE — Assessment & Plan Note (Signed)
Continue increased dose of lasix.  Elevate leg. Avoid salt.  'Consider change to torsemide next week. Conversion is torsemide 1 mg to furosemide 2-4 mg. Likely change lasix to torsemide 20 mg twice daily.

## 2022-07-14 NOTE — Assessment & Plan Note (Signed)
Prescription keflex renal adjusted  Check labs.  Elevate leg.

## 2022-07-15 ENCOUNTER — Ambulatory Visit: Payer: Medicare Other | Admitting: Nurse Practitioner

## 2022-07-16 ENCOUNTER — Telehealth: Payer: Self-pay

## 2022-07-16 NOTE — Progress Notes (Signed)
Subjective:  Patient ID: Tracy Vasquez, female    DOB: [birth year]  Age: 89 y.o. MRN: [id number]  Chief Complaints:   Follow up for the right leg cellulitis  History of Present illness:  Patient is here after she finish the course of Cephalexin 250 mg 3 times daily for the 7 days for her right leg cellulitis. It looks much better and patient does not have any specific complaints today. She kept it elevating over night. There is some trace edema and patient is taking Lasix 20 mg OD  Current Outpatient Medications on File Prior to Visit  Medication Sig Dispense Refill   acetaminophen (TYLENOL) 500 MG tablet Take 1,000 mg by mouth every 6 (six) hours as needed for mild pain.     Ascorbic Acid (VITAMIN C PO) Take 1 tablet by mouth daily.     cephALEXin (KEFLEX) 250 MG capsule Take 1 capsule (250 mg total) by mouth 3 (three) times daily. 21 capsule 0   digoxin (LANOXIN) 0.125 MG tablet Take 0.5 tablets (0.0625 mg total) by mouth every other day. Please keep upcoming appt in July 2022 with Dr. Lovena Le before anymore refills. Thank you 45 tablet 0   estradiol (ESTRACE) 0.1 MG/GM vaginal cream Place 1 Applicatorful vaginally every other day. 42.5 g 3   febuxostat (ULORIC) 40 MG tablet Take 0.5 tablets (20 mg total) by mouth daily. 30 tablet 2   furosemide (LASIX) 20 MG tablet 2 in am and 3 in pm. 1 tablet 0   LORazepam (ATIVAN) 0.5 MG tablet TAKE 1 TABLET(0.5 MG) BY MOUTH AT BEDTIME 30 tablet 3   metoprolol tartrate (LOPRESSOR) 100 MG tablet Take 1 tablet (100 mg total) by mouth 2 (two) times daily. 180 tablet 2   pravastatin (PRAVACHOL) 20 MG tablet TAKE 1 TABLET(20 MG) BY MOUTH DAILY 90 tablet 1   sodium bicarbonate 650 MG tablet Take 650 mg by mouth daily.     XARELTO 15 MG TABS tablet TAKE 1 TABLET BY MOUTH DAILY WITH SUPPER 90 tablet 1   No current facility-administered medications on file prior to visit.   Past Medical History:  Diagnosis Date   Acute gangrenous appendicitis with  perforation and peritonitis 11/22/2011   Afib ([place]) 2008   CVA (cerebral infarction) 2008   no residual deficits   Osteoporosis    Past Surgical History:  Procedure Laterality Date   INSERT / REPLACE / REMOVE PACEMAKER     LAPAROSCOPIC APPENDECTOMY  11/22/2011   Procedure: APPENDECTOMY LAPAROSCOPIC;  Surgeon: Stark Klein, MD;  Location: [facility];  Service: General;  Laterality: N/A;   PACEMAKER INSERTION     PPM GENERATOR CHANGEOUT N/A 08/16/2017   Procedure: PPM GENERATOR CHANGEOUT;  Surgeon: Evans Lance, MD;  Location: [facility];  Service: Cardiovascular;  Laterality: N/A;   RIGHT/LEFT HEART CATH AND CORONARY ANGIOGRAPHY N/A 02/01/2017   Procedure: Right/Left Heart Cath and Coronary Angiography;  Surgeon: Troy Sine, MD;  Location: [facility];  Service: Cardiovascular;  Laterality: N/A;   TONSILLECTOMY AND ADENOIDECTOMY  1935    Family History  Problem Relation Age of Onset   Coronary artery disease Mother    Heart attack Mother    Atrial fibrillation Father    Lung cancer Father    Alcohol abuse Brother    Failure to thrive Brother    Social History   Socioeconomic History   Marital status: Widowed    Spouse name: Not on file   Number of children: 3  Years of education: Not on file   Highest education level: Not on file  Occupational History   Occupation: Retired  Tobacco Use   Smoking status: Former    Types: Cigarettes    Quit date: 1975    Years since quitting: 49.0   Smokeless tobacco: Never  Vaping Use   Vaping Use: Never used  Substance and Sexual Activity   Alcohol use: No   Drug use: No   Sexual activity: Not Currently  Other Topics Concern   Not on file  Social History Narrative   Lives with dtr, moved to [place] from [place] in 2014, widowed early 2013 (76 years married)   Social Determinants of Radio broadcast assistant Strain: Not on file  Food Insecurity: Not on file  Transportation Needs: Not on file  Physical  Activity: Not on file  Stress: Not on file  Social Connections: Not on file    Review of Systems See pertinent positives and negatives per HPI.  Objective:  BP 112/66 (BP Location: Right Arm, Patient Position: Sitting, Cuff Size: Normal)   Pulse (!) 59   Temp (!) 96.6 F (35.9 C) (Temporal)   Ht 5' 1.5" (1.562 m)   Wt 123 lb (55.8 kg)   SpO2 92%   BMI 22.86 kg/m       07/09/2022   11:53 AM 06/17/2022    3:40 PM 02/10/2022   10:50 AM  BP/Weight  Systolic BP 539 767 341  Diastolic BP 62 78 60  Wt. (Lbs) 122 124 123  BMI 22.68 kg/m2 23.43 kg/m2 22.86 kg/m2    Physical Exam Vitals reviewed.  Constitutional:      Appearance: Normal appearance.  Neck:     Vascular: No carotid bruit.  Cardiovascular:     Rate and Rhythm: Normal rate and regular rhythm.     Heart sounds: Normal heart sounds.  Pulmonary:     Effort: Pulmonary effort is normal.     Breath sounds: Normal breath sounds.  Abdominal:     General: Bowel sounds are normal.     Palpations: Abdomen is soft.     Tenderness: There is no abdominal tenderness.  Musculoskeletal:        General: No swelling, tenderness, deformity or signs of injury.     Right lower leg: Edema (pitting edema) present.     Left lower leg: Edema (pitting edema) present.  Neurological:     Mental Status: She is alert and oriented to person, place, and time.  Psychiatric:        Mood and Affect: Mood normal.        Behavior: Behavior normal.     Diabetic Foot Exam - Simple   No data filed      Lab Results  Component Value Date   WBC 8.1 07/09/2022   HGB 13.3 07/09/2022   HCT 39.5 07/09/2022   PLT 257 07/09/2022   GLUCOSE 84 07/09/2022   CHOL 152 02/10/2022   TRIG 93 02/10/2022   HDL 67 02/10/2022   LDLCALC 68 02/10/2022   ALT 8 07/09/2022   AST 14 07/09/2022   NA 138 07/09/2022   K 4.4 07/09/2022   CL 93 (L) 07/09/2022   CREATININE 1.60 (H) 07/09/2022   BUN 28 07/09/2022   CO2 31 (H) 07/09/2022   TSH 2.190 03/05/2017    INR 1.15 01/31/2017    Assessment & Plan:   Cellulitis of right leg Assessment & Plan: Patient finished the course of antibiotics.  Her legs looks better and she does not have any specific complaints today Will keep monitoring  Keep elevating legs   Pedal edema Assessment & Plan: Patient finished the course of antibiotics for cellulitis of BLLE and it looks better on this follow up Taking Lasix 20 mg OD for pedal edema and it looks better today Follow up as needed    Follow-up:  in 3 months for chronic fasting I, Jahna Liebert have reviewed all documentation for this visit. The documentation on 07/17/22   for the exam, diagnosis, procedures, and orders are all accurate and complete.     An After Visit Summary was printed and given to the patient.  Neil Crouch, DNP, [place] [phone]

## 2022-07-16 NOTE — Telephone Encounter (Signed)
Patient notified.  She will keep follow-up appointment.

## 2022-07-17 ENCOUNTER — Encounter: Payer: Self-pay | Admitting: Nurse Practitioner

## 2022-07-17 ENCOUNTER — Ambulatory Visit (INDEPENDENT_AMBULATORY_CARE_PROVIDER_SITE_OTHER): Payer: Medicare Other | Admitting: Nurse Practitioner

## 2022-07-17 VITALS — BP 112/66 | HR 59 | Temp 96.6°F | Ht 61.5 in | Wt 123.0 lb

## 2022-07-17 DIAGNOSIS — L03115 Cellulitis of right lower limb: Secondary | ICD-10-CM | POA: Diagnosis not present

## 2022-07-17 DIAGNOSIS — R600 Localized edema: Secondary | ICD-10-CM

## 2022-07-17 NOTE — Assessment & Plan Note (Signed)
Patient finished the course of antibiotics for cellulitis of BLLE and it looks better on this follow up Taking Lasix 20 mg OD for pedal edema and it looks better today Follow up as needed

## 2022-07-17 NOTE — Patient Instructions (Signed)
Follow up in 3 months

## 2022-07-17 NOTE — Assessment & Plan Note (Signed)
Patient finished the course of antibiotics.  Her legs looks better and she does not have any specific complaints today Will keep monitoring  Keep elevating legs

## 2022-07-30 ENCOUNTER — Other Ambulatory Visit: Payer: Self-pay

## 2022-07-30 ENCOUNTER — Telehealth: Payer: Self-pay

## 2022-07-30 NOTE — Telephone Encounter (Signed)
Patient has order of blood work from [facility] are in. I left message on voicemail to call us back to set up a nurse visit to draw these labs.

## 2022-08-03 NOTE — Telephone Encounter (Signed)
The patient left a message on Friday stating that she had a missed call and was unsure the reason for the call. I tired to contact the patient this morning. I left a message stating that it looks like we have an order for blood work form another office and was calling to get a nurse appointment scheduled. I asked for the patient to call the office back to schedule an appointment. Waiting for her to call the office.

## 2022-08-04 NOTE — Telephone Encounter (Signed)
Appointment has been scheduled.

## 2022-08-05 ENCOUNTER — Other Ambulatory Visit: Payer: Self-pay

## 2022-08-05 MED ORDER — PRAVASTATIN SODIUM 20 MG PO TABS: ORAL_TABLET | ORAL | 1 refills | Status: DC

## 2022-08-10 ENCOUNTER — Other Ambulatory Visit: Payer: Self-pay

## 2022-08-10 DIAGNOSIS — N184 Chronic kidney disease, stage 4 (severe): Secondary | ICD-10-CM

## 2022-08-11 ENCOUNTER — Other Ambulatory Visit: Payer: Medicare Other

## 2022-08-11 DIAGNOSIS — N184 Chronic kidney disease, stage 4 (severe): Secondary | ICD-10-CM

## 2022-08-13 LAB — CBC WITH DIFFERENTIAL/PLATELET
Basophils Absolute: 0.1 10*3/uL (ref 0.0–0.2)
Basos: 1 %
EOS (ABSOLUTE): 0.1 10*3/uL (ref 0.0–0.4)
Eos: 1 %
Hematocrit: 39.5 % (ref 34.0–46.6)
Hemoglobin: 13.3 g/dL (ref 11.1–15.9)
Immature Grans (Abs): 0 10*3/uL (ref 0.0–0.1)
Immature Granulocytes: 0 %
Lymphocytes Absolute: 2 10*3/uL (ref 0.7–3.1)
Lymphs: 29 %
MCH: 30.7 pg (ref 26.6–33.0)
MCHC: 33.7 g/dL (ref 31.5–35.7)
MCV: 91 fL (ref 79–97)
Monocytes Absolute: 0.6 10*3/uL (ref 0.1–0.9)
Monocytes: 9 %
Neutrophils Absolute: 4.2 10*3/uL (ref 1.4–7.0)
Neutrophils: 60 %
Platelets: 221 10*3/uL (ref 150–450)
RBC: 4.33 x10E6/uL (ref 3.77–5.28)
RDW: 12.3 % (ref 11.7–15.4)
WBC: 7 10*3/uL (ref 3.4–10.8)

## 2022-08-13 LAB — RENAL FUNCTION PANEL
Albumin: 4.2 g/dL (ref 3.6–4.6)
BUN/Creatinine Ratio: 20 (ref 12–28)
BUN: 36 mg/dL (ref 10–36)
CO2: 27 mmol/L (ref 20–29)
Calcium: 9.5 mg/dL (ref 8.7–10.3)
Chloride: 96 mmol/L (ref 96–106)
Creatinine, Ser: 1.8 mg/dL — ABNORMAL HIGH (ref 0.57–1.00)
Glucose: 82 mg/dL (ref 70–99)
Phosphorus: 3.6 mg/dL (ref 3.0–4.3)
Potassium: 4.5 mmol/L (ref 3.5–5.2)
Sodium: 139 mmol/L (ref 134–144)
eGFR: 26 mL/min/{1.73_m2} — ABNORMAL LOW (ref >59)

## 2022-08-13 LAB — MAGNESIUM: Magnesium: 2.2 mg/dL (ref 1.6–2.3)

## 2022-08-13 LAB — PARATHYROID HORMONE, INTACT (NO CA): PTH: 104 pg/mL — ABNORMAL HIGH (ref 15–65)

## 2022-08-13 LAB — PROT/CREAT RATIO, SERUM
Prot/Creat Ratio, S: 3.94
Total Protein: 7.1 g/dL (ref 6.0–8.5)

## 2022-08-27 ENCOUNTER — Ambulatory Visit (INDEPENDENT_AMBULATORY_CARE_PROVIDER_SITE_OTHER): Payer: Medicare Other

## 2022-08-27 DIAGNOSIS — I495 Sick sinus syndrome: Secondary | ICD-10-CM

## 2022-08-27 LAB — CUP PACEART REMOTE DEVICE CHECK
Battery Remaining Longevity: 42 mo
Battery Remaining Percentage: 64 %
Brady Statistic RA Percent Paced: 0 %
Brady Statistic RV Percent Paced: 24 %
Date Time Interrogation Session: 20240222030100
Implantable Lead Connection Status: 753985
Implantable Lead Connection Status: 753985
Implantable Lead Implant Date: 20080827
Implantable Lead Implant Date: 20080827
Implantable Lead Location: 753859
Implantable Lead Location: 753860
Implantable Lead Model: 4135
Implantable Lead Model: 4136
Implantable Lead Serial Number: 28356762
Implantable Lead Serial Number: 28365470
Implantable Pulse Generator Implant Date: 20190211
Lead Channel Impedance Value: 558 Ohm
Lead Channel Pacing Threshold Amplitude: 0.8 V
Lead Channel Pacing Threshold Pulse Width: 0.4 ms
Lead Channel Setting Pacing Amplitude: 1.3 V
Lead Channel Setting Pacing Pulse Width: 0.4 ms
Lead Channel Setting Sensing Sensitivity: 2.5 mV
Pulse Gen Serial Number: 396317
Zone Setting Status: 755011

## 2022-09-03 ENCOUNTER — Ambulatory Visit (INDEPENDENT_AMBULATORY_CARE_PROVIDER_SITE_OTHER): Payer: Medicare Other

## 2022-09-03 VITALS — Ht 61.5 in | Wt 122.0 lb

## 2022-09-03 DIAGNOSIS — Z0000 Encounter for general adult medical examination without abnormal findings: Secondary | ICD-10-CM

## 2022-09-03 NOTE — Patient Instructions (Signed)

## 2022-09-03 NOTE — Progress Notes (Signed)
Subjective:   Tracy Vasquez is a 89 y.o. female who presents for an Initial Medicare Annual Wellness Visit.  I connected with  [name] on 09/03/22 by a phone enabled telemedicine application and verified that I am speaking with the correct person using two identifiers.   I discussed the limitations of evaluation and management by telemedicine. The patient expressed understanding and agreed to proceed.   Cardiac Risk Factors include: advanced age (>52mn, >>7women);hypertension     Objective:    Today's Vitals   09/03/22 1006  Weight: 122 lb (55.3 kg)  Height: 5' 1.5" (1.562 m)   Body mass index is 22.68 kg/m.     09/03/2022   10:08 AM 04/16/2021    1:27 PM 08/16/2017    8:27 AM 01/27/2017   11:00 PM 01/27/2017    6:24 PM 11/22/2011    5:59 PM  Advanced Directives  Does Patient Have a Medical Advance Directive? Yes No Yes Yes No Patient has advance directive, copy not in chart  Type of Advance [place]ive [place] (pink MOST or yellow form)  [place] will  Living will  Does patient want to make changes to medical advance directive?   No - Patient declined No - Patient declined    Copy of [place]? No - copy requested  No - copy requested     Would patient like information on creating a medical advance directive?  No - Patient declined        Current Medications (verified) Outpatient Encounter Medications as of 09/03/2022  Medication Sig   acetaminophen (TYLENOL) 500 MG tablet Take 1,000 mg by mouth every 6 (six) hours as needed for mild pain.   digoxin (LANOXIN) 0.125 MG tablet Take 0.5 tablets (0.0625 mg total) by mouth every other day. Please keep upcoming appt in July 2022 with Dr. TLovena Lebefore anymore refills. Thank you   estradiol (ESTRACE) 0.1 MG/GM vaginal cream Place 1 Applicatorful vaginally every other day.   febuxostat (ULORIC) 40 MG tablet Take 0.5  tablets (20 mg total) by mouth daily.   furosemide (LASIX) 20 MG tablet 2 in am and 3 in pm.   LORazepam (ATIVAN) 0.5 MG tablet TAKE 1 TABLET(0.5 MG) BY MOUTH AT BEDTIME   metoprolol tartrate (LOPRESSOR) 100 MG tablet Take 1 tablet (100 mg total) by mouth 2 (two) times daily.   pravastatin (PRAVACHOL) 20 MG tablet TAKE 1 TABLET(20 MG) BY MOUTH DAILY   XARELTO 15 MG TABS tablet TAKE 1 TABLET BY MOUTH DAILY WITH SUPPER   [DISCONTINUED] metoprolol succinate (TOPROL-XL) 25 MG 24 hr tablet Take by mouth.   [DISCONTINUED] sodium bicarbonate 650 MG tablet Take 650 mg by mouth daily.   [DISCONTINUED] Ascorbic Acid (VITAMIN C PO) Take 1 tablet by mouth daily.   No facility-administered encounter medications on file as of 09/03/2022.    Allergies (verified) Patient has no known allergies.   History: Past Medical History:  Diagnosis Date   Acute gangrenous appendicitis with perforation and peritonitis 11/22/2011   [place]   CVA (cerebral infarction) 2008   no residual deficits   Osteoporosis    Past Surgical History:  Procedure Laterality Date   INSERT / REPLACE / REMOVE PACEMAKER     LAPAROSCOPIC APPENDECTOMY  11/22/2011   Procedure: APPENDECTOMY LAPAROSCOPIC;  Surgeon: FStark Klein MD;  [facility]  Service: General;  Laterality: N/A;   PACEMAKER INSERTION  PPM GENERATOR CHANGEOUT N/A 08/16/2017   Procedure: PPM GENERATOR CHANGEOUT;  Surgeon: Evans Lance, MD;  Location: [facility] [facility];  Service: Cardiovascular;  Laterality: N/A;   RIGHT/LEFT HEART CATH AND CORONARY ANGIOGRAPHY N/A 02/01/2017   Procedure: Right/Left Heart Cath and Coronary Angiography;  Surgeon: Troy Sine, MD;  Location: [facility];  Service: Cardiovascular;  Laterality: N/A;   TONSILLECTOMY AND ADENOIDECTOMY  1935   Family History  Problem Relation Age of Onset   Coronary artery disease Mother    Heart attack Mother    Atrial fibrillation Father    Lung cancer Father    Alcohol  abuse Brother    Failure to thrive Brother    Social History   Socioeconomic History   Marital status: Widowed    Spouse name: Not on file   Number of children: 3   Years of education: Not on file   Highest education level: Not on file  Occupational History   Occupation: Retired  Tobacco Use   Smoking status: Former    Types: Cigarettes    Quit date: 1975    Years since quitting: 49.1   Smokeless tobacco: Never  Vaping Use   Vaping Use: Never used  Substance and Sexual Activity   Alcohol use: No   Drug use: No   Sexual activity: Not Currently  Other Topics Concern   Not on file  Social History Narrative   Lives with dtr, moved to [place] from [place] in 2014, widowed early 2013 (60 years married)   Social Determinants of Health   Financial Resource Strain: [place]  (09/03/2022)   Overall Financial Resource Strain (CARDIA)    Difficulty of Paying Living Expenses: Not hard at all  Food Insecurity: No Food Insecurity (09/03/2022)   Hunger Vital Sign    Worried About Running Out of Food in the Last Year: Never true    [place] in the Last Year: Never true  Transportation Needs: No Transportation Needs (09/03/2022)   PRAPARE - Hydrologist (Medical): No    Lack of Transportation (Non-Medical): No  Physical Activity: Sufficiently Active (09/03/2022)   Exercise Vital Sign    Days of Exercise per Week: 3 days    Minutes of Exercise per Session: 60 min  Stress: No Stress Concern Present (09/03/2022)   [place]    Feeling of Stress : Not at all  Social Connections: Socially Isolated (09/03/2022)   Social Connection and Isolation Panel [NHANES]    Frequency of Communication with Friends and Family: More than three times a week    Frequency of Social Gatherings with Friends and Family: More than three times a week    Attends Religious Services: Never    Corporate treasurer or Organizations: No    Attends Archivist Meetings: Never    Marital Status: Widowed    Tobacco Counseling Counseling given: Not Answered   Clinical Intake:  Pre-visit preparation completed: Yes  Pain : No/denies pain     BMI - recorded: 22.68 Nutritional Status: BMI of 19-24  Normal Nutritional Risks: None Diabetes: No  How often do you need to have someone help you when you read instructions, pamphlets, or other written materials from your doctor or pharmacy?: 1 - Never What is the last grade level you completed in school?: Bachelor  Diabetic?no  Interpreter Needed?: No      Activities  of Daily Living    09/03/2022   10:10 AM  In your present state of health, do you have any difficulty performing the following activities:  Hearing? 0  Vision? 0  Difficulty concentrating or making decisions? 0  Walking or climbing stairs? 0  Dressing or bathing? 0  Doing errands, shopping? 0  Preparing Food and eating ? N  Using the Toilet? N  In the past six months, have you accidently leaked urine? N  Do you have problems with loss of bowel control? N  Managing your Medications? N  Managing your Finances? N  Housekeeping or managing your Housekeeping? N    Patient Care Team: Lillard Anes, MD (Inactive) as PCP - General (Family Medicine) Evans Lance, MD (Cardiology) Claudia Desanctis, MD as Consulting Physician (Nephrology)  Indicate any recent Medical Services you may have received from other than [facility] providers in the past year (date may be approximate).     Assessment:   This is a routine wellness examination for [place].  Hearing/Vision screen No results found.  Dietary issues and exercise activities discussed: Current Exercise Habits: Structured exercise class, Type of exercise: strength training/weights;stretching, Time (Minutes): 60, Frequency (Times/Week): 3, Weekly Exercise (Minutes/Week): 180, Intensity: Moderate, Exercise limited  by: cardiac condition(s)   Goals Addressed             This Visit's Progress    Absence of Fall and Fall-Related Injury       Evidence-based guidance:  Assess fall risk using a validated tool when available. Consider balance and gait impairment, muscle weakness, diminished vision or hearing, environmental hazards, presence of urinary or bowel urgency and/or incontinence.  Communicate fall injury risk to interprofessional healthcare team.  Develop a fall prevention plan with the patient and family.  Promote use of personal vision and auditory aids.  Promote reorientation, appropriate sensory stimulation, and routines to decrease risk of fall when changes in mental status are present.  Assess assistance level required for safe and effective self-care; consider referral for home care.  Encourage physical activity, such as performance of self-care at highest level of ability, strength and balance exercise program, and provision of appropriate assistive devices; refer to rehabilitation therapy.  Refer to community-based fall prevention program where available.  If fall occurs, determine the cause and revise fall injury prevention plan.  Regularly review medication contribution to fall risk; consider risk related to polypharmacy and age.  Refer to pharmacist for consultation when concerns about medications are revealed.  Provide guidance related to environmental modifications.  Consider supplementation with Vitamin D.   Notes:      DIET - EAT MORE FRUITS AND VEGETABLES        Depression Screen    09/03/2022   10:10 AM 08/06/2021   10:16 AM 03/27/2020    1:47 PM 01/01/2014   11:27 AM 12/29/2012   10:26 AM  PHQ 2/9 Scores  PHQ - 2 Score 0 0 0 0 0    Fall Risk    09/03/2022   10:09 AM 02/10/2022   10:56 AM 01/15/2021   11:18 AM 08/28/2020    1:36 PM 07/09/2020   11:29 AM  Fall Risk   Falls in the past year? 1 0 0 0 0  Number falls in past yr: 0 0 0 0 0  Injury with Fall? 1 0 0 0 0  Risk  for fall due to : Impaired balance/gait;Impaired mobility Impaired balance/gait Impaired mobility    Follow up Falls prevention discussed Falls  prevention discussed  Falls evaluation completed Falls evaluation completed    FALL RISK PREVENTION PERTAINING TO THE HOME:  Any stairs in or around the home? Yes  If so, are there any without handrails? Yes  Home free of loose throw rugs in walkways, pet beds, electrical cords, etc? Yes  Adequate lighting in your home to reduce risk of falls? Yes   ASSISTIVE DEVICES UTILIZED TO PREVENT FALLS:  Life alert? No  Use of a cane, walker or w/c? Yes  Grab bars in the bathroom? Yes  Shower chair or bench in shower? No  Elevated toilet seat or a handicapped toilet? Yes   TIMED UP AND GO:  Was the test performed? No .   Cognitive Function:        09/03/2022   10:13 AM  6CIT Screen  What Year? 0 points  What month? 0 points  What time? 0 points  Count back from 20 0 points  Months in reverse 0 points  Repeat phrase 0 points  Total Score 0 points    Immunizations Immunization History  Administered Date(s) Administered   Fluad Quad(high Dose 65+) 04/19/2021   Influenza, High Dose Seasonal PF 04/20/2013, 04/09/2014, 04/23/2016, 04/07/2017, 05/04/2018, 05/10/2019   Moderna Sars-Covid-2 Vaccination 02/17/2020, 03/16/2020   Pneumococcal Polysaccharide-23 07/06/2006   Pneumococcal-Unspecified 07/06/2006   Zoster, Live 12/05/2006    TDAP status: Due, Education has been provided regarding the importance of this vaccine. Advised may receive this vaccine at local pharmacy or Health Dept. Aware to provide a copy of the vaccination record if obtained from local pharmacy or Health Dept. Verbalized acceptance and understanding.  Flu Vaccine status: Declined, Education has been provided regarding the importance of this vaccine but patient still declined. Advised may receive this vaccine at local pharmacy or Health Dept. Aware to provide a copy of the  vaccination record if obtained from local pharmacy or Health Dept. Verbalized acceptance and understanding.  Pneumococcal vaccine status: Declined,  Education has been provided regarding the importance of this vaccine but patient still declined. Advised may receive this vaccine at local pharmacy or Health Dept. Aware to provide a copy of the vaccination record if obtained from local pharmacy or Health Dept. Verbalized acceptance and understanding.   Covid-19 vaccine status: Completed vaccines  Qualifies for Shingles Vaccine? No   Zostavax completed No   Shingrix Completed?: No.    Education has been provided regarding the importance of this vaccine. Patient has been advised to call insurance company to determine out of pocket expense if they have not yet received this vaccine. Advised may also receive vaccine at local pharmacy or Health Dept. Verbalized acceptance and understanding.  Screening Tests Health Maintenance  Topic Date Due   COVID-19 Vaccine (3 - 2023-24 season) 09/19/2022 (Originally 03/06/2022)   INFLUENZA VACCINE  10/04/2022 (Originally 02/03/2022)   Pneumonia Vaccine 24+ Years old (2 of 2 - PCV) 09/03/2023 (Originally 07/07/2007)   Zoster Vaccines- Shingrix (1 of 2) 12/02/2023 (Originally [birth year]26[birth year]1981)   Medicare Annual Wellness (AWV)  09/03/2023   DEXA SCAN  Completed   HPV VACCINES  Aged Out   DTaP/Tdap/Td  Discontinued    Health Maintenance  There are no preventive care reminders to display for this patient.   Colorectal cancer screening: No longer required.   Mammogram status: No longer required due to medical decision due her age.  Bone Density status: Completed 12/05/2010. Results reflect: Bone density results: OSTEOPENIA. Repeat every 5 years.  Lung Cancer Screening: (Low Dose CT Chest recommended if  Age 42-80 years, 30 pack-year currently smoking OR have quit w/in 15years.) does not qualify.   Lung Cancer Screening Referral: n/a  Additional Screening:  Hepatitis  C Screening: does not qualify.  Vision Screening: Recommended annual ophthalmology exams for early detection of glaucoma and other disorders of the eye. Is the patient up to date with their annual eye exam?  Yes  If pt is not established with a provider, would they like to be referred to a provider to establish care? No .   Dental Screening: Recommended annual dental exams for proper oral hygiene  Community Resource Referral / Chronic Care Management: CRR required this visit?  No   CCM required this visit?  No      Plan:     I have personally reviewed and noted the following in the patient's chart:   Medical and social history Use of alcohol, tobacco or illicit drugs  Current medications and supplements including opioid prescriptions. Patient is not currently taking opioid prescriptions. Functional ability and status Nutritional status Physical activity Advanced directives List of other physicians Hospitalizations, surgeries, and ER visits in previous 12 months Vitals Screenings to include cognitive, depression, and falls Referrals and appointments  In addition, I have reviewed and discussed with patient certain preventive protocols, quality metrics, and best practice recommendations. A written personalized care plan for preventive services as well as general preventive health recommendations were provided to patient.     Augustin Coupe, Seth Ward   09/03/2022   Nurse Notes: Her goals is continue eating healthy and exercise 3 times per week. She enjoys to be independent. I recommended to continue with current treatment. I asked to bring a copy on Healthcare directive. I will mail AVS to her home.  40 minutes by phone.

## 2022-09-21 NOTE — Progress Notes (Signed)
Remote pacemaker transmission.   

## 2022-10-27 ENCOUNTER — Ambulatory Visit: Payer: Medicare Other | Admitting: Family Medicine

## 2022-10-27 VITALS — BP 106/60 | HR 84 | Temp 97.3°F | Resp 16 | Ht 63.0 in | Wt 122.6 lb

## 2022-10-27 DIAGNOSIS — D6869 Other thrombophilia: Secondary | ICD-10-CM

## 2022-10-27 DIAGNOSIS — I700 Atherosclerosis of aorta: Secondary | ICD-10-CM

## 2022-10-27 DIAGNOSIS — I5022 Chronic systolic (congestive) heart failure: Secondary | ICD-10-CM

## 2022-10-27 DIAGNOSIS — H6123 Impacted cerumen, bilateral: Secondary | ICD-10-CM

## 2022-10-27 DIAGNOSIS — Z950 Presence of cardiac pacemaker: Secondary | ICD-10-CM | POA: Diagnosis not present

## 2022-10-27 DIAGNOSIS — N184 Chronic kidney disease, stage 4 (severe): Secondary | ICD-10-CM | POA: Diagnosis not present

## 2022-10-27 DIAGNOSIS — M1A0710 Idiopathic chronic gout, right ankle and foot, without tophus (tophi): Secondary | ICD-10-CM

## 2022-10-27 DIAGNOSIS — M10071 Idiopathic gout, right ankle and foot: Secondary | ICD-10-CM

## 2022-10-27 DIAGNOSIS — I482 Chronic atrial fibrillation, unspecified: Secondary | ICD-10-CM

## 2022-10-27 NOTE — Assessment & Plan Note (Signed)
Management per specialist. 

## 2022-10-27 NOTE — Patient Instructions (Signed)
Debrox 4 drops each ear twice daily. Recheck in the office on Friday for a nurse visit for ear irrigation.

## 2022-10-27 NOTE — Assessment & Plan Note (Signed)
The current medical regimen is effective;  continue present plan and medications. Management per specialist.  Continue digoxine 0.125 mg 1/2 daily, metoprolol 100 mg twice daily, lasix 20 mg 2 in am and 3 in pm

## 2022-10-27 NOTE — Assessment & Plan Note (Signed)
On xarelto due to atrial fibrillation

## 2022-10-27 NOTE — Assessment & Plan Note (Signed)
The current medical regimen is effective;  continue present plan and medications. Continue Xarelto 15 mg daily, digoxine 0.125 mg 1/2 daily, metoprolol 100 mg twice daily

## 2022-10-27 NOTE — Assessment & Plan Note (Signed)
Continue uloric 40 mg daily.  

## 2022-10-27 NOTE — Assessment & Plan Note (Signed)
stable °

## 2022-10-27 NOTE — Progress Notes (Signed)
wq  Subjective:  Patient ID: Tracy Vasquez, female    DOB: December 19, 1929  Age: 89 y.o. MRN: [id number]  Chief Complaint  Patient presents with   Atrial Fibrillation    HPI  Chronic atrial fibrillation:  Xarelto 15 mg daily, digoxine 0.125 mg 1/2 daily, metoprolol 100 mg twice daily, lasix 20 mg 2 in am and 3 in pm.   Chronic systolic congestive heart failure: Currently on digoxin, metoprolol, and Lasix.  Doses above  High cholesterol: on pravastatin 20 mg daily.  Complaining of hearing loss and requesting removal of wax. Brought a form to fill out for [employer] hearing aids.   History of gout.  Currently on Uloric 40 mg daily.  Chronic kidney disease stage IV: Sees nephrology.     10/27/2022    1:38 PM 09/03/2022   10:10 AM 08/06/2021   10:16 AM 03/27/2020    1:47 PM 01/01/2014   11:27 AM  Depression screen PHQ 2/9  Decreased Interest 0 0 0 0 0  Down, Depressed, Hopeless 0 0 0 0 0  PHQ - 2 Score 0 0 0 0 0         04/19/2021    2:00 AM 04/19/2021   12:00 PM 02/10/2022   10:56 AM 09/03/2022   10:09 AM 10/27/2022    1:38 PM  Fall Risk  Falls in the past year?   0 1 1  Was there an injury with Fall?   0 1 1  Fall Risk Category Calculator   0 2 2  Fall Risk Category (Retired)   Low    (RETIRED) Patient Fall Risk Level Moderate fall risk Moderate fall risk Moderate fall risk    Patient at Risk for Falls Due to   Impaired balance/gait Impaired balance/gait;Impaired mobility Impaired balance/gait;Impaired mobility  Fall risk Follow up   Falls prevention discussed Falls prevention discussed       Review of Systems  Constitutional:  Negative for chills, fatigue and fever.  HENT:  Negative for congestion, rhinorrhea and sore throat.        Cerumen impaction bilaterally  Respiratory:  Negative for cough and shortness of breath.   Cardiovascular:  Negative for chest pain.  Gastrointestinal:  Negative for abdominal pain, constipation, diarrhea, nausea and vomiting.  Genitourinary:   Negative for dysuria and urgency.  Musculoskeletal:  Negative for back pain and myalgias.  Neurological:  Negative for dizziness, weakness, light-headedness and headaches.       Balance issues  Psychiatric/Behavioral:  Negative for dysphoric mood. The patient is not nervous/anxious.     Current Outpatient Medications on File Prior to Visit  Medication Sig Dispense Refill   acetaminophen (TYLENOL) 500 MG tablet Take 1,000 mg by mouth every 6 (six) hours as needed for mild pain.     digoxin (LANOXIN) 0.125 MG tablet Take 0.5 tablets (0.0625 mg total) by mouth every other day. Please keep upcoming appt in July 2022 with Dr. Ladona Ridgel before anymore refills. Thank you 45 tablet 0   estradiol (ESTRACE) 0.1 MG/GM vaginal cream Place 1 Applicatorful vaginally every other day. 42.5 g 3   febuxostat (ULORIC) 40 MG tablet Take 0.5 tablets (20 mg total) by mouth daily. 30 tablet 2   furosemide (LASIX) 20 MG tablet 2 in am and 3 in pm. 1 tablet 0   metoprolol tartrate (LOPRESSOR) 100 MG tablet Take 1 tablet (100 mg total) by mouth 2 (two) times daily. 180 tablet 2   pravastatin (PRAVACHOL) 20 MG tablet TAKE 1 TABLET(20 MG)  BY MOUTH DAILY 90 tablet 1   XARELTO 15 MG TABS tablet TAKE 1 TABLET BY MOUTH DAILY WITH SUPPER 90 tablet 1   No current facility-administered medications on file prior to visit.   Past Medical History:  Diagnosis Date   Acute gangrenous appendicitis with perforation and peritonitis 11/22/2011   Afib (HCC) 2008   CVA (cerebral infarction) 2008   no residual deficits   Osteoporosis    Tachycardia-bradycardia syndrome (HCC) 01/28/2021   Past Surgical History:  Procedure Laterality Date   INSERT / REPLACE / REMOVE PACEMAKER     LAPAROSCOPIC APPENDECTOMY  11/22/2011   Procedure: APPENDECTOMY LAPAROSCOPIC;  Surgeon: Almond Lint, MD;  Location: [facility];  Service: General;  Laterality: N/A;   PACEMAKER INSERTION     PPM GENERATOR CHANGEOUT N/A 08/16/2017   Procedure: PPM GENERATOR  CHANGEOUT;  Surgeon: Marinus Maw, MD;  Location: [facility] INVASIVE CV LAB;  Service: Cardiovascular;  Laterality: N/A;   RIGHT/LEFT HEART CATH AND CORONARY ANGIOGRAPHY N/A 02/01/2017   Procedure: Right/Left Heart Cath and Coronary Angiography;  Surgeon: Lennette Bihari, MD;  Location: [facility] INVASIVE CV LAB;  Service: Cardiovascular;  Laterality: N/A;   TONSILLECTOMY AND ADENOIDECTOMY  1935    Family History  Problem Relation Age of Onset   Coronary artery disease Mother    Heart attack Mother    Atrial fibrillation Father    Lung cancer Father    Alcohol abuse Brother    Failure to thrive Brother    Social History   Socioeconomic History   Marital status: Widowed    Spouse name: Not on file   Number of children: 3   Years of education: Not on file   Highest education level: Not on file  Occupational History   Occupation: Retired  Tobacco Use   Smoking status: Former    Types: Cigarettes    Quit date: 1975    Years since quitting: 49.3   Smokeless tobacco: Never  Vaping Use   Vaping Use: Never used  Substance and Sexual Activity   Alcohol use: No   Drug use: No   Sexual activity: Not Currently  Other Topics Concern   Not on file  Social History Narrative   Lives with dtr, moved to [place] from [place] in 2014, widowed early 2013 (60 years married)   Social Determinants of Health   Financial Resource Strain: Low Risk  (09/03/2022)   Overall Financial Resource Strain (CARDIA)    Difficulty of Paying Living Expenses: Not hard at all  Food Insecurity: No Food Insecurity (09/03/2022)   Hunger Vital Sign    Worried About Running Out of Food in the Last Year: Never true    Ran Out of Food in the Last Year: Never true  Transportation Needs: No Transportation Needs (09/03/2022)   PRAPARE - Administrator, Civil Service (Medical): No    Lack of Transportation (Non-Medical): No  Physical Activity: Sufficiently Active (09/03/2022)   Exercise Vital Sign    Days of  Exercise per Week: 3 days    Minutes of Exercise per Session: 60 min  Stress: No Stress Concern Present (09/03/2022)   [employer] of Occupational Health - Occupational Stress Questionnaire    Feeling of Stress : Not at all  Social Connections: Socially Isolated (09/03/2022)   Social Connection and Isolation Panel [NHANES]    Frequency of Communication with Friends and Family: More than three times a week    Frequency of Social Gatherings with Friends and  Family: More than three times a week    Attends Religious Services: Never    Active Member of Clubs or Organizations: No    Attends Banker Meetings: Never    Marital Status: Widowed    Objective:  BP 106/60   Pulse 84   Temp (!) 97.3 F (36.3 C)   Resp 16   Ht 5\' 3"  (1.6 m)   Wt 122 lb 9.6 oz (55.6 kg)   BMI 21.72 kg/m      10/27/2022    1:29 PM 09/03/2022   10:06 AM 07/17/2022   10:04 AM  BP/Weight  Systolic BP 106  409  Diastolic BP 60  66  Wt. (Lbs) 122.6 122 123  BMI 21.72 kg/m2 22.68 kg/m2 22.86 kg/m2    Physical Exam Vitals reviewed.  Constitutional:      Appearance: Normal appearance.  HENT:     Right Ear: There is impacted cerumen.     Left Ear: There is impacted cerumen.     Nose: Nose normal.     Mouth/Throat:     Pharynx: Oropharynx is clear.  Neck:     Vascular: No carotid bruit.  Cardiovascular:     Rate and Rhythm: Normal rate. Rhythm irregular.     Heart sounds: Murmur heard.  Pulmonary:     Effort: Pulmonary effort is normal. No respiratory distress.     Breath sounds: Normal breath sounds.  Abdominal:     General: Bowel sounds are normal.     Palpations: Abdomen is soft.     Tenderness: There is no abdominal tenderness.  Neurological:     Mental Status: She is alert and oriented to person, place, and time.  Psychiatric:        Mood and Affect: Mood normal.        Behavior: Behavior normal.     Diabetic Foot Exam - Simple   No data filed      Lab Results   Component Value Date   WBC 7.0 08/11/2022   HGB 13.3 08/11/2022   HCT 39.5 08/11/2022   PLT 221 08/11/2022   GLUCOSE 82 08/11/2022   CHOL 152 02/10/2022   TRIG 93 02/10/2022   HDL 67 02/10/2022   LDLCALC 68 02/10/2022   ALT 8 07/09/2022   AST 14 07/09/2022   NA 139 08/11/2022   K 4.5 08/11/2022   CL 96 08/11/2022   CREATININE 1.80 (H) 08/11/2022   BUN 36 08/11/2022   CO2 27 08/11/2022   TSH 2.190 03/05/2017   INR 1.15 01/31/2017      Assessment & Plan:    Chronic kidney disease, stage 4 (severe) (HCC) Assessment & Plan: Management per specialist.     Pacemaker Assessment & Plan: stable   Chronic atrial fibrillation (HCC) Assessment & Plan: The current medical regimen is effective;  continue present plan and medications. Continue Xarelto 15 mg daily, digoxine 0.125 mg 1/2 daily, metoprolol 100 mg twice daily    Chronic systolic CHF (congestive heart failure) (HCC) Assessment & Plan: The current medical regimen is effective;  continue present plan and medications. Management per specialist.  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    Aortic atherosclerosis (HCC)  Bilateral impacted cerumen Assessment &  Plan: Debrox 4 drops each ear twice daily. Recheck in the office on Friday for a nurse visit for ear irrigation.        No orders of the defined types were placed in this encounter.   No orders of the defined types were placed in this encounter.    Follow-up: No follow-ups on file.   I,Carolyn M Morrison,acting as a Neurosurgeon for Blane Ohara, MD.,have documented all relevant documentation on the behalf of Blane Ohara, MD,as directed by  Blane Ohara, MD while in the presence of Blane Ohara, MD.    An After Visit Summary was printed and given to the patient.  Blane Ohara, MD [facility] [phone]

## 2022-10-28 ENCOUNTER — Other Ambulatory Visit: Payer: Self-pay

## 2022-10-28 DIAGNOSIS — F5101 Primary insomnia: Secondary | ICD-10-CM

## 2022-10-28 MED ORDER — LORAZEPAM 0.5 MG PO TABS: ORAL_TABLET | ORAL | 3 refills | Status: DC

## 2022-10-30 ENCOUNTER — Ambulatory Visit (INDEPENDENT_AMBULATORY_CARE_PROVIDER_SITE_OTHER): Payer: Medicare Other

## 2022-10-30 DIAGNOSIS — H6123 Impacted cerumen, bilateral: Secondary | ICD-10-CM | POA: Diagnosis not present

## 2022-10-30 NOTE — Progress Notes (Signed)
Tracy Vasquez comes in for ear irrigation.  She has been using debrox at home. Ears were irrigated without difficulty.

## 2022-10-31 ENCOUNTER — Encounter: Payer: Self-pay | Admitting: Family Medicine

## 2022-10-31 DIAGNOSIS — H6123 Impacted cerumen, bilateral: Secondary | ICD-10-CM | POA: Insufficient documentation

## 2022-10-31 NOTE — Assessment & Plan Note (Signed)
Debrox 4 drops each ear twice daily. Recheck in the office on Friday for a nurse visit for ear irrigation.   

## 2022-11-16 ENCOUNTER — Other Ambulatory Visit: Payer: Medicare Other

## 2022-11-16 ENCOUNTER — Other Ambulatory Visit: Payer: Self-pay

## 2022-11-16 DIAGNOSIS — N184 Chronic kidney disease, stage 4 (severe): Secondary | ICD-10-CM

## 2022-11-16 NOTE — Progress Notes (Signed)
PATIENT CAME IN WITH ORDERS FOR LABS TO BE DONE FROM  [facility] FROM VISIT 10/14/22.  Fax results to them when they get back at [phone]

## 2022-11-19 LAB — RENAL FUNCTION PANEL
Albumin: 4.3 g/dL (ref 3.6–4.6)
BUN/Creatinine Ratio: 21 (ref 12–28)
BUN: 36 mg/dL (ref 10–36)
CO2: 29 mmol/L (ref 20–29)
Calcium: 9.8 mg/dL (ref 8.7–10.3)
Chloride: 97 mmol/L (ref 96–106)
Creatinine, Ser: 1.71 mg/dL — ABNORMAL HIGH (ref 0.57–1.00)
Glucose: 80 mg/dL (ref 70–99)
Phosphorus: 3.7 mg/dL (ref 3.0–4.3)
Potassium: 4.6 mmol/L (ref 3.5–5.2)
Sodium: 141 mmol/L (ref 134–144)
eGFR: 28 mL/min/{1.73_m2} — ABNORMAL LOW (ref >59)

## 2022-11-19 LAB — URINALYSIS, COMPLETE
Bilirubin, UA: NEGATIVE
Glucose, UA: NEGATIVE
Ketones, UA: NEGATIVE
Nitrite, UA: NEGATIVE
Specific Gravity, UA: 1.009 (ref 1.005–1.030)
Urobilinogen, Ur: 0.2 mg/dL (ref 0.2–1.0)
pH, UA: 7 (ref 5.0–7.5)

## 2022-11-19 LAB — MAGNESIUM: Magnesium: 2.2 mg/dL (ref 1.6–2.3)

## 2022-11-19 LAB — MICROSCOPIC EXAMINATION
Casts: NONE SEEN /lpf
WBC, UA: >30 /hpf — AB (ref 0–5)

## 2022-11-19 LAB — HEMOGLOBIN: Hemoglobin: 12.5 g/dL (ref 11.1–15.9)

## 2022-11-19 LAB — INTACT PTH (INCLUDES CALCIUM)
Calcium, Serum: 10.1 mg/dL
PTH (Intact Assay): 100 pg/mL — ABNORMAL HIGH

## 2022-11-20 LAB — LAB REPORT - SCANNED: Creatinine, POC: 24.5 mg/dL

## 2022-11-23 ENCOUNTER — Other Ambulatory Visit: Payer: Self-pay | Admitting: Family Medicine

## 2022-11-23 MED ORDER — CIPROFLOXACIN HCL 250 MG PO TABS: 250.0000 mg | ORAL_TABLET | Freq: Every day | ORAL | 0 refills | Status: DC

## 2022-11-26 ENCOUNTER — Ambulatory Visit (INDEPENDENT_AMBULATORY_CARE_PROVIDER_SITE_OTHER): Payer: Medicare Other

## 2022-11-26 DIAGNOSIS — I495 Sick sinus syndrome: Secondary | ICD-10-CM | POA: Diagnosis not present

## 2022-11-26 LAB — CUP PACEART REMOTE DEVICE CHECK
Battery Remaining Longevity: 36 mo
Battery Remaining Percentage: 56 %
Brady Statistic RA Percent Paced: 0 %
Brady Statistic RV Percent Paced: 22 %
Date Time Interrogation Session: 20240523030000
Implantable Lead Connection Status: 753985
Implantable Lead Connection Status: 753985
Implantable Lead Implant Date: 20080827
Implantable Lead Implant Date: 20080827
Implantable Lead Location: 753859
Implantable Lead Location: 753860
Implantable Lead Model: 4135
Implantable Lead Model: 4136
Implantable Lead Serial Number: 28356762
Implantable Lead Serial Number: 28365470
Implantable Pulse Generator Implant Date: 20190211
Lead Channel Impedance Value: 560 Ohm
Lead Channel Pacing Threshold Amplitude: 0.8 V
Lead Channel Pacing Threshold Pulse Width: 0.4 ms
Lead Channel Setting Pacing Amplitude: 1.2 V
Lead Channel Setting Pacing Pulse Width: 0.4 ms
Lead Channel Setting Sensing Sensitivity: 2.5 mV
Pulse Gen Serial Number: 396317
Zone Setting Status: 755011

## 2022-12-08 ENCOUNTER — Ambulatory Visit: Payer: Medicare Other | Admitting: Physician Assistant

## 2022-12-16 NOTE — Progress Notes (Signed)
Remote pacemaker transmission.   

## 2022-12-23 ENCOUNTER — Other Ambulatory Visit: Payer: Self-pay

## 2022-12-23 DIAGNOSIS — I5022 Chronic systolic (congestive) heart failure: Secondary | ICD-10-CM

## 2022-12-23 MED ORDER — DIGOXIN 125 MCG PO TABS
0.0625 mg | ORAL_TABLET | ORAL | 0 refills | Status: DC
Start: 2022-12-23 — End: 2023-07-14

## 2022-12-23 NOTE — Telephone Encounter (Signed)
Pt's medication was sent to pt's pharmacy as requested. Confirmation received.  °

## 2023-01-29 ENCOUNTER — Other Ambulatory Visit: Payer: Self-pay | Admitting: Family Medicine

## 2023-02-02 ENCOUNTER — Other Ambulatory Visit: Payer: Self-pay

## 2023-02-02 DIAGNOSIS — I482 Chronic atrial fibrillation, unspecified: Secondary | ICD-10-CM

## 2023-02-02 MED ORDER — RIVAROXABAN 15 MG PO TABS
15.0000 mg | ORAL_TABLET | Freq: Every day | ORAL | 1 refills | Status: DC
Start: 2023-02-02 — End: 2023-08-06

## 2023-02-02 NOTE — Telephone Encounter (Signed)
Prescription refill request for Xarelto received.  Indication:afib Last office visit:8/23 Weight:55.6  kg Age:89 Scr:1.6  1/24 CrCl:20.1  ml/min  Prescription refilled

## 2023-02-10 ENCOUNTER — Other Ambulatory Visit: Payer: Self-pay

## 2023-02-10 DIAGNOSIS — N184 Chronic kidney disease, stage 4 (severe): Secondary | ICD-10-CM

## 2023-02-11 ENCOUNTER — Other Ambulatory Visit: Payer: Medicare Other

## 2023-02-11 ENCOUNTER — Ambulatory Visit: Payer: Medicare Other | Admitting: Internal Medicine

## 2023-02-11 DIAGNOSIS — N184 Chronic kidney disease, stage 4 (severe): Secondary | ICD-10-CM

## 2023-02-24 ENCOUNTER — Other Ambulatory Visit: Payer: Self-pay

## 2023-02-24 MED ORDER — DAPAGLIFLOZIN PROPANEDIOL 5 MG PO TABS: 5.0000 mg | ORAL_TABLET | Freq: Every day | ORAL | 2 refills | Status: DC

## 2023-02-25 ENCOUNTER — Other Ambulatory Visit: Payer: Self-pay | Admitting: Family Medicine

## 2023-02-25 ENCOUNTER — Ambulatory Visit (INDEPENDENT_AMBULATORY_CARE_PROVIDER_SITE_OTHER): Payer: Medicare Other

## 2023-02-25 DIAGNOSIS — F5101 Primary insomnia: Secondary | ICD-10-CM

## 2023-02-25 DIAGNOSIS — I495 Sick sinus syndrome: Secondary | ICD-10-CM

## 2023-02-25 LAB — CUP PACEART REMOTE DEVICE CHECK
Battery Remaining Longevity: 30 mo
Battery Remaining Percentage: 48 %
Brady Statistic RA Percent Paced: 0 %
Brady Statistic RV Percent Paced: 22 %
Date Time Interrogation Session: 20240822030100
Implantable Lead Connection Status: 753985
Implantable Lead Connection Status: 753985
Implantable Lead Implant Date: 20080827
Implantable Lead Implant Date: 20080827
Implantable Lead Location: 753859
Implantable Lead Location: 753860
Implantable Lead Model: 4135
Implantable Lead Model: 4136
Implantable Lead Serial Number: 28356762
Implantable Lead Serial Number: 28365470
Implantable Pulse Generator Implant Date: 20190211
Lead Channel Impedance Value: 559 Ohm
Lead Channel Pacing Threshold Amplitude: 0.8 V
Lead Channel Pacing Threshold Pulse Width: 0.4 ms
Lead Channel Setting Pacing Amplitude: 1.2 V
Lead Channel Setting Pacing Pulse Width: 0.4 ms
Lead Channel Setting Sensing Sensitivity: 2.5 mV
Pulse Gen Serial Number: 396317
Zone Setting Status: 755011

## 2023-03-04 ENCOUNTER — Telehealth: Payer: Self-pay

## 2023-03-04 ENCOUNTER — Telehealth: Payer: Self-pay | Admitting: Internal Medicine

## 2023-03-04 NOTE — Telephone Encounter (Signed)
[facility] called because patient medication was transferred from [facility]. They would like to know if they can change the manufacturer for her digoxin?

## 2023-03-04 NOTE — Telephone Encounter (Signed)
Pt c/o medication issue:  1. Name of Medication: digoxin (LANOXIN) 0.125 MG tablet   2. How are you currently taking this medication (dosage and times per day)?  Take 0.5 tablets (0.0625 mg total) by mouth every other day. Please keep upcoming appt in July 2022 with Dr. Ladona Ridgel before anymore refills. Thank you       3. Are you having a reaction (difficulty breathing--STAT)? No  4. What is your medication issue? Morrie Sheldon from [facility] is requesting a callback regarding this medication being transferred to them from [facility] but they'd like to change the manufacturer. Please advise

## 2023-03-04 NOTE — Telephone Encounter (Signed)
Morrie Sheldon form [facility] called and wanted to know how many grams does the patient use for her estradiol, they recently got audit by insurance and now has to put in the amount of rams that patient has to use with creams.  Per Langley Gauss, PA-C: patient should use 4 g to fill applicator.

## 2023-03-05 ENCOUNTER — Other Ambulatory Visit: Payer: Self-pay

## 2023-03-05 NOTE — Telephone Encounter (Signed)
Spoke with Raynelle Fanning the pharmacist. Per Dr. Ladona Ridgel ok to change manufacturer.

## 2023-03-05 NOTE — Telephone Encounter (Signed)
Ok to change. GT

## 2023-03-09 NOTE — Progress Notes (Signed)
Remote pacemaker transmission.   

## 2023-03-23 ENCOUNTER — Ambulatory Visit: Payer: Medicare Other | Attending: Internal Medicine | Admitting: Internal Medicine

## 2023-03-23 ENCOUNTER — Encounter: Payer: Self-pay | Admitting: Internal Medicine

## 2023-03-23 VITALS — BP 132/72 | HR 73 | Ht 63.0 in | Wt 128.0 lb

## 2023-03-23 DIAGNOSIS — I495 Sick sinus syndrome: Secondary | ICD-10-CM | POA: Diagnosis not present

## 2023-03-23 LAB — CUP PACEART INCLINIC DEVICE CHECK
Date Time Interrogation Session: 20240917164030
Implantable Lead Connection Status: 753985
Implantable Lead Connection Status: 753985
Implantable Lead Implant Date: 20080827
Implantable Lead Implant Date: 20080827
Implantable Lead Location: 753859
Implantable Lead Location: 753860
Implantable Lead Model: 4135
Implantable Lead Model: 4136
Implantable Lead Serial Number: 28356762
Implantable Lead Serial Number: 28365470
Implantable Pulse Generator Implant Date: 20190211
Lead Channel Impedance Value: 525 Ohm
Lead Channel Impedance Value: 587 Ohm
Lead Channel Pacing Threshold Amplitude: 0 V
Lead Channel Pacing Threshold Amplitude: 0.6 V
Lead Channel Pacing Threshold Amplitude: 0.7 V
Lead Channel Pacing Threshold Pulse Width: 0 ms
Lead Channel Pacing Threshold Pulse Width: 0.4 ms
Lead Channel Pacing Threshold Pulse Width: 0.4 ms
Lead Channel Sensing Intrinsic Amplitude: 20 mV
Lead Channel Setting Pacing Amplitude: 1.3 V
Lead Channel Setting Pacing Pulse Width: 0.4 ms
Lead Channel Setting Sensing Sensitivity: 2.5 mV
Pulse Gen Serial Number: 396317
Zone Setting Status: 755011

## 2023-03-23 NOTE — Patient Instructions (Signed)
Medication Instructions:  Your physician recommends that you continue on your current medications as directed. Please refer to the Current Medication list given to you today.  *If you need a refill on your cardiac medications before your next appointment, please call your pharmacy*  Lab Work: None ordered.  If you have labs (blood work) drawn today and your tests are completely normal, you will receive your results only by: MyChart Message (if you have MyChart) OR A paper copy in the mail If you have any lab test that is abnormal or we need to change your treatment, we will call you to review the results.  Testing/Procedures: None ordered.  Follow-Up: At [facility], you and your health needs are our priority.  As part of our continuing mission to provide you with exceptional heart care, we have created designated Provider Care Teams.  These Care Teams include your primary Cardiologist (physician) and Advanced Practice Providers (APPs -  Physician Assistants and Nurse Practitioners) who all work together to provide you with the care you need, when you need it.  Your next appointment:   1 year(s)  The format for your next appointment:   In Person  Provider:   Lewayne Bunting, MD{or one of the following Advanced Practice Providers on your designated Care Team:   Francis Dowse, [place] Casimiro Needle "Mardelle Matte" [place], [place] Earnest Rosier, NP  Important Information About Sugar

## 2023-03-23 NOTE — Progress Notes (Signed)
HPI Tracy Vasquez returns today for followup. She is a pleasant 89 yo woman with a h/o HTN, persistent atrial fib, and CHB, s/p PPM insertion. She denies chest pain, sob, peripheral edema, and has not had syncope. She remains active. she had been on low dose amiodarone but was stopped as she had gone back into atrial fib and did not know it. She denies chest pain or sob.  No Known Allergies   Current Outpatient Medications  Medication Sig Dispense Refill   acetaminophen (TYLENOL) 500 MG tablet Take 1,000 mg by mouth every 6 (six) hours as needed for mild pain.     ciprofloxacin (CIPRO) 250 MG tablet Take 1 tablet (250 mg total) by mouth daily. 3 tablet 0   dapagliflozin propanediol (FARXIGA) 5 MG TABS tablet Take 1 tablet (5 mg total) by mouth daily before breakfast. 30 tablet 2   digoxin (LANOXIN) 0.125 MG tablet Take 0.5 tablets (0.0625 mg total) by mouth every other day. Please keep upcoming appt in July 2022 with Dr. Ladona Ridgel before anymore refills. Thank you 45 tablet 0   estradiol (ESTRACE) 0.1 MG/GM vaginal cream Place 1 Applicatorful vaginally every other day. 42.5 g 3   febuxostat (ULORIC) 40 MG tablet TAKE 1/2 TABLET(20 MG) BY MOUTH DAILY 30 tablet 2   furosemide (LASIX) 20 MG tablet 2 in am and 3 in pm. 1 tablet 0   LORazepam (ATIVAN) 0.5 MG tablet TAKE 1 TABLET(0.5 MG) BY MOUTH AT BEDTIME 30 tablet 2   metoprolol tartrate (LOPRESSOR) 100 MG tablet Take 1 tablet (100 mg total) by mouth 2 (two) times daily. 180 tablet 2   pravastatin (PRAVACHOL) 20 MG tablet TAKE 1 TABLET(20 MG) BY MOUTH DAILY 90 tablet 0   Rivaroxaban (XARELTO) 15 MG TABS tablet Take 1 tablet (15 mg total) by mouth daily with supper. 90 tablet 1   No current facility-administered medications for this visit.     Past Medical History:  Diagnosis Date   Acute gangrenous appendicitis with perforation and peritonitis 11/22/2011   Afib (HCC) 2008   CVA (cerebral infarction) 2008   no residual deficits    Osteoporosis    Tachycardia-bradycardia syndrome (HCC) 01/28/2021    ROS:   All systems reviewed and negative except as noted in the HPI.   Past Surgical History:  Procedure Laterality Date   INSERT / REPLACE / REMOVE PACEMAKER     LAPAROSCOPIC APPENDECTOMY  11/22/2011   Procedure: APPENDECTOMY LAPAROSCOPIC;  Surgeon: Almond Lint, MD;  Location: [facility] OR;  Service: General;  Laterality: N/A;   PACEMAKER INSERTION     PPM GENERATOR CHANGEOUT N/A 08/16/2017   Procedure: PPM GENERATOR CHANGEOUT;  Surgeon: Marinus Maw, MD;  Location: [facility] INVASIVE CV LAB;  Service: Cardiovascular;  Laterality: N/A;   RIGHT/LEFT HEART CATH AND CORONARY ANGIOGRAPHY N/A 02/01/2017   Procedure: Right/Left Heart Cath and Coronary Angiography;  Surgeon: Lennette Bihari, MD;  Location: [facility] INVASIVE CV LAB;  Service: Cardiovascular;  Laterality: N/A;   TONSILLECTOMY AND ADENOIDECTOMY  1935     Family History  Problem Relation Age of Onset   Coronary artery disease Mother    Heart attack Mother    Atrial fibrillation Father    Lung cancer Father    Alcohol abuse Brother    Failure to thrive Brother      Social History   Socioeconomic History   Marital status: Widowed    Spouse name: Not on file   Number of children: 3  Years of education: Not on file   Highest education level: Not on file  Occupational History   Occupation: Retired  Tobacco Use   Smoking status: Former    Current packs/day: 0.00    Types: Cigarettes    Quit date: 1975    Years since quitting: 49.7   Smokeless tobacco: Never  Vaping Use   Vaping status: Never Used  Substance and Sexual Activity   Alcohol use: No   Drug use: No   Sexual activity: Not Currently  Other Topics Concern   Not on file  Social History Narrative   Lives with dtr, moved to [place] from [place] in 2014, widowed early 2013 (60 years married)   Social Determinants of Health   Financial Resource Strain: Low Risk  (09/03/2022)   Overall Financial  Resource Strain (CARDIA)    Difficulty of Paying Living Expenses: Not hard at all  Food Insecurity: No Food Insecurity (09/03/2022)   Hunger Vital Sign    Worried About Running Out of Food in the Last Year: Never true    Ran Out of Food in the Last Year: Never true  Transportation Needs: No Transportation Needs (09/03/2022)   PRAPARE - Administrator, Civil Service (Medical): No    Lack of Transportation (Non-Medical): No  Physical Activity: Sufficiently Active (09/03/2022)   Exercise Vital Sign    Days of Exercise per Week: 3 days    Minutes of Exercise per Session: 60 min  Stress: No Stress Concern Present (09/03/2022)   [employer] of Occupational Health - Occupational Stress Questionnaire    Feeling of Stress : Not at all  Social Connections: Socially Isolated (09/03/2022)   Social Connection and Isolation Panel [NHANES]    Frequency of Communication with Friends and Family: More than three times a week    Frequency of Social Gatherings with Friends and Family: More than three times a week    Attends Religious Services: Never    Database administrator or Organizations: No    Attends Banker Meetings: Never    Marital Status: Widowed  Intimate Partner Violence: Not At Risk (09/03/2022)   Humiliation, Afraid, Rape, and Kick questionnaire    Fear of Current or Ex-Partner: No    Emotionally Abused: No    Physically Abused: No    Sexually Abused: No     BP 132/72   Pulse 73   Ht 5\' 3"  (1.6 m)   Wt 128 lb (58.1 kg)   SpO2 98%   BMI 22.67 kg/m   Physical Exam:  Well appearing NAD HEENT: Unremarkable Neck:  No JVD, no thyromegally Lymphatics:  No adenopathy Back:  No CVA tenderness Lungs:  Clear HEART:  Regular rate rhythm, no murmurs, no rubs, no clicks Abd:  soft, positive bowel sounds, no organomegally, no rebound, no guarding Ext:  2 plus pulses, no edema, no cyanosis, no clubbing Skin:  No rashes no nodules Neuro:  CN II through XII  intact, motor grossly intact  EKG - atrial fib with an IVCD  DEVICE  Normal device function.  See PaceArt for details.   Assess/Plan:  Atrial fib - her VR is controlled. She will continue our current meds. She has been off of amiodarone. HTN - her bp is elevated a bit. We will ask her to avoid salty foods. No change in her meds now. PPM - her [employer] DDD PM is working normally.  HTN - her sbp is ok today. She is  encouraged to reduce her salty foods.    Sharlot Gowda TaylorMD

## 2023-03-29 ENCOUNTER — Telehealth: Payer: Self-pay | Admitting: Internal Medicine

## 2023-03-29 MED ORDER — METOPROLOL TARTRATE 100 MG PO TABS: 100.0000 mg | ORAL_TABLET | Freq: Two times a day (BID) | ORAL | 3 refills | Status: DC

## 2023-03-29 NOTE — Telephone Encounter (Signed)
*  STAT* If patient is at the pharmacy, call can be transferred to refill team.   1. Which medications need to be refilled? (please list name of each medication and dose if known)  metoprolol tartrate (LOPRESSOR) 100 MG tablet    2. Would you like to learn more about the convenience, safety, & potential cost savings by using the [facility]? No   3. Are you open to using the [facility] ([facility]. No, just transitioned to [facility]   4. Which pharmacy/location (including street and city if local pharmacy) is medication to be sent to?  [facility] - [place], [place] - [address]    5. Do they need a 30 day or 90 day supply? 90 day supply

## 2023-04-09 ENCOUNTER — Other Ambulatory Visit: Payer: Self-pay

## 2023-04-09 MED ORDER — METOPROLOL TARTRATE 100 MG PO TABS: 100.0000 mg | ORAL_TABLET | Freq: Two times a day (BID) | ORAL | 3 refills | Status: DC

## 2023-05-01 ENCOUNTER — Other Ambulatory Visit: Payer: Self-pay | Admitting: Family Medicine

## 2023-05-18 ENCOUNTER — Telehealth: Payer: Self-pay | Admitting: Family Medicine

## 2023-05-18 NOTE — Telephone Encounter (Signed)
We received lab orders from [facility]. The order was given to Lauren.

## 2023-05-24 ENCOUNTER — Other Ambulatory Visit: Payer: Medicare Other

## 2023-05-24 DIAGNOSIS — N184 Chronic kidney disease, stage 4 (severe): Secondary | ICD-10-CM

## 2023-05-24 NOTE — Addendum Note (Signed)
Addended by: Tawny Asal I on: 05/24/2023 09:11 AM   Modules accepted: Orders

## 2023-05-25 LAB — CBC WITH DIFFERENTIAL/PLATELET
Basophils Absolute: 0.1 x10E3/uL (ref 0.0–0.2)
Basos: 1 %
EOS (ABSOLUTE): 0.3 x10E3/uL (ref 0.0–0.4)
Eos: 4 %
Hematocrit: 40.6 % (ref 34.0–46.6)
Hemoglobin: 12.9 g/dL (ref 11.1–15.9)
Immature Grans (Abs): 0 x10E3/uL (ref 0.0–0.1)
Immature Granulocytes: 0 %
Lymphocytes Absolute: 2.3 x10E3/uL (ref 0.7–3.1)
Lymphs: 30 %
MCH: 29.6 pg (ref 26.6–33.0)
MCHC: 31.8 g/dL (ref 31.5–35.7)
MCV: 93 fL (ref 79–97)
Monocytes Absolute: 0.8 x10E3/uL (ref 0.1–0.9)
Monocytes: 10 %
Neutrophils Absolute: 4.1 x10E3/uL (ref 1.4–7.0)
Neutrophils: 55 %
Platelets: 247 x10E3/uL (ref 150–450)
RBC: 4.36 x10E6/uL (ref 3.77–5.28)
RDW: 12.5 % (ref 11.7–15.4)
WBC: 7.5 x10E3/uL (ref 3.4–10.8)

## 2023-05-25 LAB — RENAL FUNCTION PANEL
Albumin: 4.2 g/dL (ref 3.6–4.6)
BUN/Creatinine Ratio: 27 (ref 12–28)
BUN: 47 mg/dL — ABNORMAL HIGH (ref 10–36)
CO2: 26 mmol/L (ref 20–29)
Calcium: 9.8 mg/dL (ref 8.7–10.3)
Chloride: 99 mmol/L (ref 96–106)
Creatinine, Ser: 1.73 mg/dL — ABNORMAL HIGH (ref 0.57–1.00)
Glucose: 90 mg/dL (ref 70–99)
Phosphorus: 4 mg/dL (ref 3.0–4.3)
Potassium: 5.4 mmol/L — ABNORMAL HIGH (ref 3.5–5.2)
Sodium: 141 mmol/L (ref 134–144)
eGFR: 28 mL/min/{1.73_m2} — ABNORMAL LOW (ref >59)

## 2023-05-25 LAB — PROT/CREAT RATIO, SERUM
Prot/Creat Ratio, S: 4.34
Total Protein: 7.5 g/dL (ref 6.0–8.5)

## 2023-05-25 LAB — MAGNESIUM: Magnesium: 2.5 mg/dL — ABNORMAL HIGH (ref 1.6–2.3)

## 2023-05-25 LAB — PARATHYROID HORMONE, INTACT (NO CA): PTH: 101 pg/mL — ABNORMAL HIGH (ref 15–65)

## 2023-05-27 ENCOUNTER — Ambulatory Visit (INDEPENDENT_AMBULATORY_CARE_PROVIDER_SITE_OTHER): Payer: Medicare Other

## 2023-05-27 DIAGNOSIS — I495 Sick sinus syndrome: Secondary | ICD-10-CM | POA: Diagnosis not present

## 2023-05-27 LAB — CUP PACEART REMOTE DEVICE CHECK
Battery Remaining Longevity: 24 mo
Battery Remaining Percentage: 44 %
Brady Statistic RA Percent Paced: 0 %
Brady Statistic RV Percent Paced: 21 %
Date Time Interrogation Session: 20241121031400
Implantable Lead Connection Status: 753985
Implantable Lead Connection Status: 753985
Implantable Lead Implant Date: 20080827
Implantable Lead Implant Date: 20080827
Implantable Lead Location: 753859
Implantable Lead Location: 753860
Implantable Lead Model: 4135
Implantable Lead Model: 4136
Implantable Lead Serial Number: 28356762
Implantable Lead Serial Number: 28365470
Implantable Pulse Generator Implant Date: 20190211
Lead Channel Impedance Value: 578 Ohm
Lead Channel Pacing Threshold Amplitude: 0.8 V
Lead Channel Pacing Threshold Pulse Width: 0.4 ms
Lead Channel Setting Pacing Amplitude: 1.2 V
Lead Channel Setting Pacing Pulse Width: 0.4 ms
Lead Channel Setting Sensing Sensitivity: 2.5 mV
Pulse Gen Serial Number: 396317
Zone Setting Status: 755011

## 2023-06-11 ENCOUNTER — Other Ambulatory Visit: Payer: Self-pay | Admitting: Family Medicine

## 2023-06-11 DIAGNOSIS — F5101 Primary insomnia: Secondary | ICD-10-CM

## 2023-06-22 NOTE — Progress Notes (Signed)
Remote pacemaker transmission.   

## 2023-06-23 ENCOUNTER — Encounter: Payer: Self-pay | Admitting: Family Medicine

## 2023-06-23 ENCOUNTER — Ambulatory Visit: Payer: Medicare Other | Admitting: Family Medicine

## 2023-06-23 VITALS — BP 130/68 | HR 64 | Temp 97.6°F | Ht 63.0 in | Wt 130.0 lb

## 2023-06-23 DIAGNOSIS — N813 Complete uterovaginal prolapse: Secondary | ICD-10-CM

## 2023-06-23 DIAGNOSIS — N390 Urinary tract infection, site not specified: Secondary | ICD-10-CM | POA: Diagnosis not present

## 2023-06-23 DIAGNOSIS — N184 Chronic kidney disease, stage 4 (severe): Secondary | ICD-10-CM | POA: Diagnosis not present

## 2023-06-23 DIAGNOSIS — K5730 Diverticulosis of large intestine without perforation or abscess without bleeding: Secondary | ICD-10-CM

## 2023-06-23 DIAGNOSIS — I5022 Chronic systolic (congestive) heart failure: Secondary | ICD-10-CM

## 2023-06-23 DIAGNOSIS — D6869 Other thrombophilia: Secondary | ICD-10-CM | POA: Diagnosis not present

## 2023-06-23 DIAGNOSIS — R7303 Prediabetes: Secondary | ICD-10-CM

## 2023-06-23 DIAGNOSIS — I482 Chronic atrial fibrillation, unspecified: Secondary | ICD-10-CM

## 2023-06-23 DIAGNOSIS — N819 Female genital prolapse, unspecified: Secondary | ICD-10-CM | POA: Insufficient documentation

## 2023-06-23 DIAGNOSIS — I700 Atherosclerosis of aorta: Secondary | ICD-10-CM

## 2023-06-23 DIAGNOSIS — Z23 Encounter for immunization: Secondary | ICD-10-CM | POA: Diagnosis not present

## 2023-06-23 DIAGNOSIS — E782 Mixed hyperlipidemia: Secondary | ICD-10-CM | POA: Insufficient documentation

## 2023-06-23 LAB — HEMOGLOBIN A1C
Est. average glucose Bld gHb Est-mCnc: 128 mg/dL
Hgb A1c MFr Bld: 6.1 % — ABNORMAL HIGH (ref 4.8–5.6)

## 2023-06-23 LAB — CBC WITH DIFFERENTIAL/PLATELET
Basophils Absolute: 0.1 10*3/uL (ref 0.0–0.2)
Basos: 1 %
EOS (ABSOLUTE): 0.3 10*3/uL (ref 0.0–0.4)
Eos: 3 %
Hematocrit: 41.8 % (ref 34.0–46.6)
Hemoglobin: 13.3 g/dL (ref 11.1–15.9)
Immature Grans (Abs): 0 10*3/uL (ref 0.0–0.1)
Immature Granulocytes: 0 %
Lymphocytes Absolute: 2.5 10*3/uL (ref 0.7–3.1)
Lymphs: 30 %
MCH: 29.8 pg (ref 26.6–33.0)
MCHC: 31.8 g/dL (ref 31.5–35.7)
MCV: 94 fL (ref 79–97)
Monocytes Absolute: 0.8 10*3/uL (ref 0.1–0.9)
Monocytes: 10 %
Neutrophils Absolute: 4.6 10*3/uL (ref 1.4–7.0)
Neutrophils: 56 %
Platelets: 240 10*3/uL (ref 150–450)
RBC: 4.47 x10E6/uL (ref 3.77–5.28)
RDW: 12.7 % (ref 11.7–15.4)
WBC: 8.3 10*3/uL (ref 3.4–10.8)

## 2023-06-23 LAB — LIPID PANEL
Chol/HDL Ratio: 2.6 {ratio} (ref 0.0–4.4)
Cholesterol, Total: 148 mg/dL (ref 100–199)
HDL: 58 mg/dL (ref >39)
LDL Chol Calc (NIH): 71 mg/dL (ref 0–99)
Triglycerides: 102 mg/dL (ref 0–149)
VLDL Cholesterol Cal: 19 mg/dL (ref 5–40)

## 2023-06-23 LAB — COMPREHENSIVE METABOLIC PANEL
ALT: 13 [IU]/L (ref 0–32)
AST: 18 [IU]/L (ref 0–40)
Albumin: 4.4 g/dL (ref 3.6–4.6)
Alkaline Phosphatase: 117 [IU]/L (ref 44–121)
BUN/Creatinine Ratio: 23 (ref 12–28)
BUN: 44 mg/dL — ABNORMAL HIGH (ref 10–36)
Bilirubin Total: 0.5 mg/dL (ref 0.0–1.2)
CO2: 29 mmol/L (ref 20–29)
Calcium: 9.7 mg/dL (ref 8.7–10.3)
Chloride: 97 mmol/L (ref 96–106)
Creatinine, Ser: 1.92 mg/dL — ABNORMAL HIGH (ref 0.57–1.00)
Globulin, Total: 3.2 g/dL (ref 1.5–4.5)
Glucose: 86 mg/dL (ref 70–99)
Potassium: 5.2 mmol/L (ref 3.5–5.2)
Sodium: 139 mmol/L (ref 134–144)
Total Protein: 7.6 g/dL (ref 6.0–8.5)
eGFR: 24 mL/min/{1.73_m2} — ABNORMAL LOW (ref >59)

## 2023-06-23 LAB — POCT URINALYSIS DIP (CLINITEK)
Bilirubin, UA: NEGATIVE
Blood, UA: NEGATIVE
Glucose, UA: NEGATIVE mg/dL
Ketones, POC UA: NEGATIVE mg/dL
Nitrite, UA: NEGATIVE
POC PROTEIN,UA: NEGATIVE
Spec Grav, UA: 1.01 (ref 1.010–1.025)
Urobilinogen, UA: 0.2 U/dL
pH, UA: 7 (ref 5.0–8.0)

## 2023-06-23 MED ORDER — NITROFURANTOIN MONOHYD MACRO 100 MG PO CAPS: 100.0000 mg | ORAL_CAPSULE | Freq: Every day | ORAL | 1 refills | Status: DC

## 2023-06-23 NOTE — Assessment & Plan Note (Signed)
Recommend continue to work on eating healthy diet and exercise. Check a1c 

## 2023-06-23 NOTE — Assessment & Plan Note (Signed)
The current medical regimen is effective;  continue present plan and medications. Management per specialist.  Continue digoxin 0.125 mg 1/2 daily, metoprolol 100 mg twice daily, lasix 20 mg 2 in am and 3 in pm. Restart farxiga.  Management per specialist.

## 2023-06-23 NOTE — Assessment & Plan Note (Signed)
Well controlled.  No changes to medicines. Continue pravastatin 20 mg before bed.  Continue to work on eating a healthy diet and exercise.  Labs drawn today.

## 2023-06-23 NOTE — Assessment & Plan Note (Signed)
Secondary to xarelto needed for atrial fib

## 2023-06-23 NOTE — Assessment & Plan Note (Signed)
Per patient/urology note.  Call if wishes to proceed with gynecology referral.

## 2023-06-23 NOTE — Progress Notes (Signed)
Subjective:  Patient ID: Tracy Vasquez, female    DOB: 1930/04/23  Age: 89 y.o. MRN: [id number]  Chief Complaint  Patient presents with   Recurrent UTI    HPI Chronic atrial fibrillation:  Xarelto 15 mg daily, digoxin 0.125 mg 1/2 daily, metoprolol 100 mg twice daily, lasix 20 mg 2 in am and 3 in pm.    Chronic systolic congestive heart failure: Currently on digoxin, metoprolol, and Lasix.  Doses above.  Given farxiga 3 months ago, but she stopped it due to decreased urination.    High cholesterol: on pravastatin 20 mg daily.   History of gout.  Currently on Uloric 40 mg 1/2 daily.    Chronic kidney disease stage IV: Sees nephrology.  Patient saw a urologist several months ago and attempted a cystoscopy, but was unable to do it because of pelvic prolapse. Uses estrace sporadically.  Urology note states that the patient had urethral stenosis as well as a lot of vaginal atrophy.  I recommended Estrace which the patient is taking sporadically.  Patient had recommend to go see a gynecologist.  She has not received a referral.  Prediabetes: A1C 6.0.        10/27/2022    1:38 PM 09/03/2022   10:10 AM 08/06/2021   10:16 AM 03/27/2020    1:47 PM 01/01/2014   11:27 AM  Depression screen PHQ 2/9  Decreased Interest 0 0 0 0 0  Down, Depressed, Hopeless 0 0 0 0 0  PHQ - 2 Score 0 0 0 0 0        10/27/2022    1:38 PM  Fall Risk   Falls in the past year? 1  Number falls in past yr: 0  Injury with Fall? 1  Risk for fall due to : Impaired balance/gait;Impaired mobility    Patient Care Team: Blane Ohara, MD as PCP - General (Family Medicine) Marinus Maw, MD (Cardiology) Estanislado Emms, MD as Consulting Physician (Nephrology)   Review of Systems  Constitutional:  Negative for chills, fatigue and fever.  HENT:  Negative for congestion, ear pain, rhinorrhea and sore throat.   Respiratory:  Negative for cough and shortness of breath.   Cardiovascular:  Negative for chest pain.   Gastrointestinal:  Negative for abdominal pain, constipation, diarrhea, nausea and vomiting.  Genitourinary:  Negative for dysuria and urgency.  Musculoskeletal:  Negative for back pain and myalgias.  Neurological:  Negative for dizziness, weakness, light-headedness and headaches.  Psychiatric/Behavioral:  Negative for dysphoric mood. The patient is not nervous/anxious.     Current Outpatient Medications on File Prior to Visit  Medication Sig Dispense Refill   acetaminophen (TYLENOL) 500 MG tablet Take 1,000 mg by mouth every 6 (six) hours as needed for mild pain.     dapagliflozin propanediol (FARXIGA) 5 MG TABS tablet Take 1 tablet (5 mg total) by mouth daily before breakfast. 30 tablet 2   digoxin (LANOXIN) 0.125 MG tablet Take 0.5 tablets (0.0625 mg total) by mouth every other day. Please keep upcoming appt in July 2022 with Dr. Ladona Ridgel before anymore refills. Thank you 45 tablet 0   estradiol (ESTRACE) 0.1 MG/GM vaginal cream Place 1 Applicatorful vaginally every other day. 42.5 g 3   febuxostat (ULORIC) 40 MG tablet TAKE 1/2 TABLET(20 MG) BY MOUTH DAILY 30 tablet 2   furosemide (LASIX) 20 MG tablet 2 in am and 3 in pm. 1 tablet 0   LORazepam (ATIVAN) 0.5 MG tablet TAKE ONE TABLET BY MOUTH AT  BEDTIME 30 tablet 2   metoprolol tartrate (LOPRESSOR) 100 MG tablet Take 1 tablet (100 mg total) by mouth 2 (two) times daily. 180 tablet 3   pravastatin (PRAVACHOL) 20 MG tablet TAKE 1 TABLET(20 MG) BY MOUTH DAILY 90 tablet 1   Rivaroxaban (XARELTO) 15 MG TABS tablet Take 1 tablet (15 mg total) by mouth daily with supper. 90 tablet 1   No current facility-administered medications on file prior to visit.   Past Medical History:  Diagnosis Date   Acute gangrenous appendicitis with perforation and peritonitis 11/22/2011   Afib (HCC) 2008   CVA (cerebral infarction) 2008   no residual deficits   Osteoporosis    Tachycardia-bradycardia syndrome (HCC) 01/28/2021   Past Surgical History:   Procedure Laterality Date   INSERT / REPLACE / REMOVE PACEMAKER     LAPAROSCOPIC APPENDECTOMY  11/22/2011   Procedure: APPENDECTOMY LAPAROSCOPIC;  Surgeon: Almond Lint, MD;  Location: [facility] OR;  Service: General;  Laterality: N/A;   PACEMAKER INSERTION     PPM GENERATOR CHANGEOUT N/A 08/16/2017   Procedure: PPM GENERATOR CHANGEOUT;  Surgeon: Marinus Maw, MD;  Location: [facility] INVASIVE CV LAB;  Service: Cardiovascular;  Laterality: N/A;   RIGHT/LEFT HEART CATH AND CORONARY ANGIOGRAPHY N/A 02/01/2017   Procedure: Right/Left Heart Cath and Coronary Angiography;  Surgeon: Lennette Bihari, MD;  Location: [facility] INVASIVE CV LAB;  Service: Cardiovascular;  Laterality: N/A;   TONSILLECTOMY AND ADENOIDECTOMY  1935    Family History  Problem Relation Age of Onset   Coronary artery disease Mother    Heart attack Mother    Atrial fibrillation Father    Lung cancer Father    Alcohol abuse Brother    Failure to thrive Brother    Social History   Socioeconomic History   Marital status: Widowed    Spouse name: Not on file   Number of children: 3   Years of education: Not on file   Highest education level: Not on file  Occupational History   Occupation: Retired  Tobacco Use   Smoking status: Former    Current packs/day: 0.00    Types: Cigarettes    Quit date: 1975    Years since quitting: 49.9   Smokeless tobacco: Never  Vaping Use   Vaping status: Never Used  Substance and Sexual Activity   Alcohol use: No   Drug use: No   Sexual activity: Not Currently  Other Topics Concern   Not on file  Social History Narrative   Lives with dtr, moved to [place] from [place] in 2014, widowed early 2013 (60 years married)   Social Drivers of Corporate investment banker Strain: Low Risk  (09/03/2022)   Overall Financial Resource Strain (CARDIA)    Difficulty of Paying Living Expenses: Not hard at all  Food Insecurity: No Food Insecurity (09/03/2022)   Hunger Vital Sign    Worried About Running Out  of Food in the Last Year: Never true    Ran Out of Food in the Last Year: Never true  Transportation Needs: No Transportation Needs (09/03/2022)   PRAPARE - Administrator, Civil Service (Medical): No    Lack of Transportation (Non-Medical): No  Physical Activity: Sufficiently Active (09/03/2022)   Exercise Vital Sign    Days of Exercise per Week: 3 days    Minutes of Exercise per Session: 60 min  Stress: No Stress Concern Present (09/03/2022)   [employer] of Occupational Health - Occupational Stress Questionnaire  Feeling of Stress : Not at all  Social Connections: Socially Isolated (09/03/2022)   Social Connection and Isolation Panel [NHANES]    Frequency of Communication with Friends and Family: More than three times a week    Frequency of Social Gatherings with Friends and Family: More than three times a week    Attends Religious Services: Never    Database administrator or Organizations: No    Attends Banker Meetings: Never    Marital Status: Widowed    Objective:  BP 130/68   Pulse 64   Temp 97.6 F (36.4 C)   Ht 5\' 3"  (1.6 m)   Wt 130 lb (59 kg)   SpO2 98%   BMI 23.03 kg/m      06/23/2023    9:49 AM 03/23/2023    2:19 PM 10/27/2022    1:29 PM  BP/Weight  Systolic BP 130 132 106  Diastolic BP 68 72 60  Wt. (Lbs) 130 128 122.6  BMI 23.03 kg/m2 22.67 kg/m2 21.72 kg/m2    Physical Exam Vitals reviewed.  Constitutional:      Appearance: Normal appearance. She is normal weight.  Neck:     Vascular: No carotid bruit.  Cardiovascular:     Rate and Rhythm: Normal rate. Rhythm irregular.     Heart sounds: Normal heart sounds.  Pulmonary:     Effort: Pulmonary effort is normal. No respiratory distress.     Breath sounds: Normal breath sounds.  Abdominal:     General: Abdomen is flat. Bowel sounds are normal.     Palpations: Abdomen is soft.     Tenderness: There is no abdominal tenderness.  Neurological:     Mental Status: She is  alert and oriented to person, place, and time.  Psychiatric:        Mood and Affect: Mood normal.        Behavior: Behavior normal.     Diabetic Foot Exam - Simple   No data filed      Lab Results  Component Value Date   WBC 7.5 05/24/2023   HGB 12.9 05/24/2023   HCT 40.6 05/24/2023   PLT 247 05/24/2023   GLUCOSE 90 05/24/2023   CHOL 152 02/10/2022   TRIG 93 02/10/2022   HDL 67 02/10/2022   LDLCALC 68 02/10/2022   ALT 8 07/09/2022   AST 14 07/09/2022   NA 141 05/24/2023   K 5.4 (H) 05/24/2023   CL 99 05/24/2023   CREATININE 1.73 (H) 05/24/2023   BUN 47 (H) 05/24/2023   CO2 26 05/24/2023   TSH 2.190 03/05/2017   INR 1.15 01/31/2017   HGBA1C 6.0 (H) 02/11/2023      Assessment & Plan:    Chronic kidney disease, stage 4 (severe) (HCC) Assessment & Plan: Management per specialist.    Orders: -     CBC with Differential/Platelet -     Comprehensive metabolic panel  Chronic atrial fibrillation (HCC) Assessment & Plan: The current medical regimen is effective;  continue present plan and medications.  Continue Xarelto 15 mg daily, digoxin 0.125 mg 1/2 daily, metoprolol 100 mg twice daily. Management per specialist.      Chronic systolic CHF (congestive heart failure) (HCC) Assessment & Plan: The current medical regimen is effective;  continue present plan and medications. Management per specialist.  Continue digoxin 0.125 mg 1/2 daily, metoprolol 100 mg twice daily, lasix 20 mg 2 in am and 3 in pm. Restart farxiga.  Management per specialist.  Acquired thrombophilia (HCC) Assessment & Plan: Secondary to xarelto needed for atrial fib   Encounter for immunization -     Flu Vaccine Trivalent High Dose (Fluad)  Prediabetes Assessment & Plan: Recommend continue to work on eating healthy diet and exercise. Check a1c  Orders: -     Hemoglobin A1c  Mixed hyperlipidemia Assessment & Plan: Well controlled.  No changes to medicines. Continue  pravastatin 20 mg before bed.  Continue to work on eating a healthy diet and exercise.  Labs drawn today.    Orders: -     Lipid panel  Recurrent UTI Assessment & Plan: For recurrent UTIs: start on macrobid 100 mg daily for prevention. Also use estrace cream externally once daily x 2 weeks then twice weekly.  If this does not resolve her bladder issues, I would recommend referred to gynecologist.  Orders: -     POCT URINALYSIS DIP (CLINITEK)  Aortic atherosclerosis (HCC) Assessment & Plan: .Well controlled.  No changes to medicines. Continue pravastatin 20 mg before bed.  Continue to work on eating a healthy diet and exercise.  Labs drawn today.     Sigmoid diverticulosis Assessment & Plan: Severe.  Discussed symptoms for which to monitor.   Complete uterovaginal prolapse Assessment & Plan: Per patient/urology note.  Call if wishes to proceed with gynecology referral.    Other orders -     Nitrofurantoin Monohyd Macro; Take 1 capsule (100 mg total) by mouth daily.  Dispense: 90 capsule; Refill: 1     Meds ordered this encounter  Medications   nitrofurantoin, macrocrystal-monohydrate, (MACROBID) 100 MG capsule    Sig: Take 1 capsule (100 mg total) by mouth daily.    Dispense:  90 capsule    Refill:  1    Orders Placed This Encounter  Procedures   Flu Vaccine Trivalent High Dose (Fluad)   CBC with Differential/Platelet   Comprehensive metabolic panel   Lipid panel   Hemoglobin A1c   POCT URINALYSIS DIP (CLINITEK)     Follow-up: Return in about 3 months (around 09/21/2023) for chronic follow up.  Total time spent on today's visit was 45 minutes, including both face-to-face time and nonface-to-face time personally spent on review of chart (labs and imaging), discussing labs and goals, discussing further work-up, treatment options, referrals to specialist if needed, reviewing outside records of pertinent, answering patient's questions, and coordinating  care.  I,Katherina A Bramblett,acting as a scribe for Blane Ohara, MD.,have documented all relevant documentation on the behalf of Blane Ohara, MD,as directed by  Blane Ohara, MD while in the presence of Blane Ohara, MD.   An After Visit Summary was printed and given to the patient.  I attest that I have reviewed this visit and agree with the plan scribed by my staff.   Blane Ohara, MD [facility] [phone]

## 2023-06-23 NOTE — Assessment & Plan Note (Signed)
For recurrent UTIs: start on macrobid 100 mg daily for prevention. Also use estrace cream externally once daily x 2 weeks then twice weekly.  If this does not resolve her bladder issues, I would recommend referred to gynecologist.

## 2023-06-23 NOTE — Assessment & Plan Note (Signed)
Severe.  Discussed symptoms for which to monitor.

## 2023-06-23 NOTE — Assessment & Plan Note (Signed)
Management per specialist. 

## 2023-06-23 NOTE — Patient Instructions (Addendum)
For recurrent UTIs: start on macrobid 100 mg daily for prevention. Also use estrace cream externally once daily x 2 weeks then twice weekly.  If this does not resolve her bladder issues, I would recommend referred to gynecologist.  Congestive heart failure: Restart farxiga.   Have a [place] Christmas!  Dr. Sedalia Muta

## 2023-06-23 NOTE — Assessment & Plan Note (Signed)
The current medical regimen is effective;  continue present plan and medications.  Continue Xarelto 15 mg daily, digoxin 0.125 mg 1/2 daily, metoprolol 100 mg twice daily. Management per specialist.

## 2023-06-25 ENCOUNTER — Other Ambulatory Visit: Payer: Self-pay | Admitting: Family Medicine

## 2023-06-25 DIAGNOSIS — F5101 Primary insomnia: Secondary | ICD-10-CM

## 2023-06-25 MED ORDER — PRAVASTATIN SODIUM 20 MG PO TABS: ORAL_TABLET | ORAL | 1 refills | Status: DC

## 2023-06-25 NOTE — Telephone Encounter (Signed)
Patient requested refill on Ativan and pravastatin and to be sent to [facility] at this time.

## 2023-06-29 LAB — URINE CULTURE

## 2023-07-10 ENCOUNTER — Other Ambulatory Visit: Payer: Self-pay | Admitting: Family Medicine

## 2023-07-14 ENCOUNTER — Other Ambulatory Visit: Payer: Self-pay | Admitting: Family Medicine

## 2023-07-14 ENCOUNTER — Other Ambulatory Visit: Payer: Self-pay | Admitting: Internal Medicine

## 2023-07-14 DIAGNOSIS — I5022 Chronic systolic (congestive) heart failure: Secondary | ICD-10-CM

## 2023-08-03 ENCOUNTER — Other Ambulatory Visit: Payer: Self-pay | Admitting: Family Medicine

## 2023-08-06 ENCOUNTER — Other Ambulatory Visit: Payer: Self-pay | Admitting: Family Medicine

## 2023-08-06 ENCOUNTER — Other Ambulatory Visit: Payer: Self-pay | Admitting: Internal Medicine

## 2023-08-06 DIAGNOSIS — I482 Chronic atrial fibrillation, unspecified: Secondary | ICD-10-CM

## 2023-08-06 NOTE — Telephone Encounter (Signed)
Copied from CRM [id number]. Topic: Clinical - Medication Refill >> Aug 06, 2023  1:59 PM Hector Shade B wrote: Most Recent Primary Care Visit:  Provider: [facility]  Department[facility]  Visit Type: OFFICE VISIT  Date: 06/23/2023  Medication: Rivaroxaban (XARELTO) 15 MG TABS tablet     Has the patient contacted their pharmacy? Yes (Agent: If no, request that the patient contact the pharmacy for the refill. If patient does not wish to contact the pharmacy document the reason why and proceed with request.) (Agent: If yes, when and what did the pharmacy advise?)She was told the pharmacy was faxing over a request for the refill STAT  Is this the correct pharmacy for this prescription? Yes If no, delete pharmacy and type the correct one.  This is the patient's preferred pharmacy:   [facility] - [place], [place] - [address] [place] [address] Phone: [phone] Fax: [phone]  [facility] [phone] - [place], [place] - [address] AT [facility] [address] [facility] [address] & [address] [place] [address] Phone: [phone] Fax: [phone]  [id number] - [place], [place] - [address] [place] [address] Phone: [phone] Fax: [phone]    Has the prescription been filled recently? No  Is the patient out of the medication? Yes  Has the patient been seen for an appointment in the last year OR does the patient have an upcoming appointment? Yes  Can we respond through MyChart? No  Agent: Please be advised that Rx refills may take up to 3 business days. We ask that you follow-up with your pharmacy.

## 2023-08-06 NOTE — Telephone Encounter (Signed)
Xarelto 15mg  refill request received. Pt is 89 years old, weight-59kg, Crea-1.92 on 06/23/23, last seen by Dr. Ladona Ridgel on 03/23/23, Diagnosis-Afib, CrCl-17.41 mL/min; Dose is appropriate based on dosing criteria. Will send in refill to requested pharmacy.

## 2023-08-09 MED ORDER — RIVAROXABAN 15 MG PO TABS: 15.0000 mg | ORAL_TABLET | Freq: Every day | ORAL | Status: DC

## 2023-08-20 ENCOUNTER — Other Ambulatory Visit: Payer: Self-pay | Admitting: Family Medicine

## 2023-08-26 ENCOUNTER — Ambulatory Visit (INDEPENDENT_AMBULATORY_CARE_PROVIDER_SITE_OTHER): Payer: Medicare Other

## 2023-08-26 DIAGNOSIS — I495 Sick sinus syndrome: Secondary | ICD-10-CM

## 2023-08-27 ENCOUNTER — Encounter: Payer: Self-pay | Admitting: Internal Medicine

## 2023-08-27 LAB — CUP PACEART REMOTE DEVICE CHECK
Battery Remaining Longevity: 24 mo
Battery Remaining Percentage: 36 %
Brady Statistic RA Percent Paced: 0 %
Brady Statistic RV Percent Paced: 24 %
Date Time Interrogation Session: 20250220030000
Implantable Lead Connection Status: 753985
Implantable Lead Connection Status: 753985
Implantable Lead Implant Date: 20080827
Implantable Lead Implant Date: 20080827
Implantable Lead Location: 753859
Implantable Lead Location: 753860
Implantable Lead Model: 4135
Implantable Lead Model: 4136
Implantable Lead Serial Number: 28356762
Implantable Lead Serial Number: 28365470
Implantable Pulse Generator Implant Date: 20190211
Lead Channel Impedance Value: 599 Ohm
Lead Channel Pacing Threshold Amplitude: 0.8 V
Lead Channel Pacing Threshold Pulse Width: 0.4 ms
Lead Channel Setting Pacing Amplitude: 1.3 V
Lead Channel Setting Pacing Pulse Width: 0.4 ms
Lead Channel Setting Sensing Sensitivity: 2.5 mV
Pulse Gen Serial Number: 396317
Zone Setting Status: 755011

## 2023-09-09 ENCOUNTER — Other Ambulatory Visit: Payer: Self-pay

## 2023-09-09 ENCOUNTER — Ambulatory Visit

## 2023-09-09 DIAGNOSIS — F5101 Primary insomnia: Secondary | ICD-10-CM

## 2023-09-14 ENCOUNTER — Other Ambulatory Visit: Payer: Self-pay | Admitting: Family Medicine

## 2023-09-27 NOTE — Progress Notes (Unsigned)
 Subjective:  Patient ID: Tracy Vasquez, female    DOB: May 21, 1930  Age: 89 y.o. MRN: [id number]  Chief Complaint  Patient presents with   Medical Management of Chronic Issues   Discussed the use of AI scribe software for clinical note transcription with the patient, who gave verbal consent to proceed.  History of Present Illness   [name], a 89 year old patient with a history of atrial fibrillation, congestive heart failure, cholesterol, and prediabetes, presents for a routine check-up. She reports that her antibiotic for UTI prevention was not filled, but she has not experienced any bladder symptoms. She has been taking her other medications as prescribed, including Xarelto, digoxin, metoprolol, Lasix, pravastatin, Uloric, and [place]. She also takes vitamin D. She has not had any recent falls and uses a cane for mobility. She lives independently in an apartment and is generally active, with regular contact with her family and friends. She reports no significant cognitive issues.       Chronic atrial fibrillation:  Xarelto 15 mg daily, digoxin 0.125 mg 1/2 daily, metoprolol 100 mg twice daily, lasix 20 mg 2 in am and 3 in pm.    Chronic systolic congestive heart failure: Currently on digoxin, metoprolol, and Lasix, farxiga. Sees Cardiologist in August.   High cholesterol: on pravastatin 20 mg daily.   History of gout.  Currently on Uloric 40 mg 1/2 daily.    Chronic kidney disease stage IV: Sees nephrology.  Prediabetes: A1C 6.1       09/28/2023    2:17 PM 10/27/2022    1:38 PM 09/03/2022   10:10 AM 08/06/2021   10:16 AM 03/27/2020    1:47 PM  Depression screen PHQ 2/9  Decreased Interest 0 0 0 0 0  Down, Depressed, Hopeless 0 0 0 0 0  PHQ - 2 Score 0 0 0 0 0        10/27/2022    1:38 PM  Fall Risk   Falls in the past year? 1  Number falls in past yr: 0  Injury with Fall? 1  Risk for fall due to : Impaired balance/gait;Impaired mobility    Patient Care Team: Blane Ohara,  MD as PCP - General (Family Medicine) Marinus Maw, MD (Cardiology) Estanislado Emms, MD as Consulting Physician (Nephrology)   Review of Systems  Constitutional:  Negative for chills, fatigue and fever.  HENT:  Negative for congestion, ear pain and sore throat.   Respiratory:  Negative for cough and shortness of breath.   Cardiovascular:  Negative for chest pain and palpitations.  Gastrointestinal:  Negative for abdominal pain, constipation, diarrhea, nausea and vomiting.  Endocrine: Negative for polydipsia, polyphagia and polyuria.  Genitourinary:  Negative for difficulty urinating and dysuria.  Musculoskeletal:  Negative for arthralgias, back pain and myalgias.  Skin:  Negative for rash.  Neurological:  Negative for headaches.  Psychiatric/Behavioral:  Negative for dysphoric mood. The patient is not nervous/anxious.     Current Outpatient Medications on File Prior to Visit  Medication Sig Dispense Refill   acetaminophen (TYLENOL) 500 MG tablet Take 1,000 mg by mouth every 6 (six) hours as needed for mild pain.     dapagliflozin propanediol (FARXIGA) 5 MG TABS tablet TAKE ONE TABLET BY MOUTH DAILY BEFORE BREAKFAST 30 tablet 1   digoxin (LANOXIN) 0.125 MG tablet TAKE 1/2 TABLET BY MOUTH EVERY OTHER DAY 45 tablet 3   estradiol (ESTRACE) 0.1 MG/GM vaginal cream Place 1 Applicatorful vaginally every other day. 42.5 g 3   febuxostat (  ULORIC) 40 MG tablet TAKE 1/2 TABLET BY MOUTH DAILY 30 tablet 0   furosemide (LASIX) 20 MG tablet 2 in am and 3 in pm. 1 tablet 0   LORazepam (ATIVAN) 0.5 MG tablet TAKE ONE TABLET BY MOUTH AT BEDTIME 30 tablet 2   metoprolol tartrate (LOPRESSOR) 100 MG tablet Take 1 tablet (100 mg total) by mouth 2 (two) times daily. 180 tablet 3   pravastatin (PRAVACHOL) 20 MG tablet TAKE 1 TABLET(20 MG) BY MOUTH DAILY 90 tablet 1   Rivaroxaban (XARELTO) 15 MG TABS tablet TAKE ONE TABLET BY MOUTH DAILY WITH SUPPER 90 tablet 1   No current facility-administered medications  on file prior to visit.   Past Medical History:  Diagnosis Date   Acute gangrenous appendicitis with perforation and peritonitis 11/22/2011   Afib (HCC) 2008   CVA (cerebral infarction) 2008   no residual deficits   Osteoporosis    Tachycardia-bradycardia syndrome (HCC) 01/28/2021   Past Surgical History:  Procedure Laterality Date   INSERT / REPLACE / REMOVE PACEMAKER     LAPAROSCOPIC APPENDECTOMY  11/22/2011   Procedure: APPENDECTOMY LAPAROSCOPIC;  Surgeon: Almond Lint, MD;  Location: [facility] OR;  Service: General;  Laterality: N/A;   PACEMAKER INSERTION     PPM GENERATOR CHANGEOUT N/A 08/16/2017   Procedure: PPM GENERATOR CHANGEOUT;  Surgeon: Marinus Maw, MD;  Location: [facility] INVASIVE CV LAB;  Service: Cardiovascular;  Laterality: N/A;   RIGHT/LEFT HEART CATH AND CORONARY ANGIOGRAPHY N/A 02/01/2017   Procedure: Right/Left Heart Cath and Coronary Angiography;  Surgeon: Lennette Bihari, MD;  Location: [facility] INVASIVE CV LAB;  Service: Cardiovascular;  Laterality: N/A;   TONSILLECTOMY AND ADENOIDECTOMY  1935    Family History  Problem Relation Age of Onset   Coronary artery disease Mother    Heart attack Mother    Atrial fibrillation Father    Lung cancer Father    Alcohol abuse Brother    Failure to thrive Brother    Social History   Socioeconomic History   Marital status: Widowed    Spouse name: Not on file   Number of children: 3   Years of education: Not on file   Highest education level: Not on file  Occupational History   Occupation: Retired  Tobacco Use   Smoking status: Former    Current packs/day: 0.00    Types: Cigarettes    Quit date: 1975    Years since quitting: 50.2   Smokeless tobacco: Never  Vaping Use   Vaping status: Never Used  Substance and Sexual Activity   Alcohol use: No   Drug use: No   Sexual activity: Not Currently  Other Topics Concern   Not on file  Social History Narrative   Lives with dtr, moved to [place] from [place] in 2014, widowed  early 2013 (60 years married)   Social Drivers of Corporate investment banker Strain: Low Risk  (09/28/2023)   Overall Financial Resource Strain (CARDIA)    Difficulty of Paying Living Expenses: Not hard at all  Food Insecurity: No Food Insecurity (09/28/2023)   Hunger Vital Sign    Worried About Running Out of Food in the Last Year: Never true    Ran Out of Food in the Last Year: Never true  Transportation Needs: No Transportation Needs (09/03/2022)   PRAPARE - Administrator, Civil Service (Medical): No    Lack of Transportation (Non-Medical): No  Physical Activity: Sufficiently Active (09/03/2022)   Exercise Vital Sign  Days of Exercise per Week: 3 days    Minutes of Exercise per Session: 60 min  Stress: No Stress Concern Present (09/28/2023)   [employer] of Occupational Health - Occupational Stress Questionnaire    Feeling of Stress : Not at all  Social Connections: Socially Isolated (09/03/2022)   Social Connection and Isolation Panel [NHANES]    Frequency of Communication with Friends and Family: More than three times a week    Frequency of Social Gatherings with Friends and Family: More than three times a week    Attends Religious Services: Never    Database administrator or Organizations: No    Attends Banker Meetings: Never    Marital Status: Widowed    Objective:  BP 120/80   Pulse 74   Temp (!) 97.3 F (36.3 C)   Resp 14   Ht 5\' 3"  (1.6 m)   Wt 128 lb (58.1 kg)   SpO2 97%   BMI 22.67 kg/m      09/28/2023    1:26 PM 06/23/2023    9:49 AM 03/23/2023    2:19 PM  BP/Weight  Systolic BP 120 130 132  Diastolic BP 80 68 72  Wt. (Lbs) 128 130 128  BMI 22.67 kg/m2 23.03 kg/m2 22.67 kg/m2    Physical Exam  Diabetic Foot Exam - Simple   No data filed      Lab Results  Component Value Date   WBC 8.3 06/23/2023   HGB 13.3 06/23/2023   HCT 41.8 06/23/2023   PLT 240 06/23/2023   GLUCOSE 86 06/23/2023   CHOL 148 06/23/2023    TRIG 102 06/23/2023   HDL 58 06/23/2023   LDLCALC 71 06/23/2023   ALT 13 06/23/2023   AST 18 06/23/2023   NA 139 06/23/2023   K 5.2 06/23/2023   CL 97 06/23/2023   CREATININE 1.92 (H) 06/23/2023   BUN 44 (H) 06/23/2023   CO2 29 06/23/2023   TSH 2.190 03/05/2017   INR 1.15 01/31/2017   HGBA1C 6.1 (H) 06/23/2023      Assessment & Plan:  Assessment and Plan    Urinary Tract Infection (UTI) Positive urine culture in December. No current bladder symptoms. Previously prescribed prophylactic antibiotic for recurrent UTIs, but prescription was not filled. Agrees to start prophylactic antibiotic therapy. - Send prescription for prophylactic antibiotic for UTI prevention.  Atrial Fibrillation and Congestive Heart Failure Chronic atrial fibrillation and congestive heart failure managed with Xarelto, digoxin, metoprolol, Lasix, and Farxiga. Recent change to generic Farxiga. No new symptoms. Follows up with cardiologist annually, next appointment in August.  Chronic Kidney Disease Chronic kidney disease with recent nephrologist follow-up. No new issues.  Prediabetes A1c was 6.1 at last check. Condition is well-managed with current lifestyle. Maintains a healthy diet and exercises regularly.  Hyperlipidemia Managed with pravastatin. No new issues.  Gout Managed with Uloric. No new issues.  General Health Maintenance Due for pneumonia vaccine and bone density test. Medicare annual wellness visit discussed but she prefers not to return for it. Vitamin D supplementation noted. Lives in a safe environment with adequate support from family and services. - Administer Prevnar 20 vaccine. - Order bone density test. - Schedule Medicare annual wellness visit with nurse or new provider.  Goals of Care Has a living will and healthcare power of attorney designated to her daughter. Code status updated in 2022 indicating DNR preferences. No legal document on file with the practice. Values  independence and has adequate support from  family and services. - Obtain a copy of the living will and healthcare power of attorney.  Follow-up Return for Medicare annual wellness visit and provide a urine sample for testing. - Schedule follow-up for Medicare annual wellness visit. - Collect urine sample for analysis.         Chronic atrial fibrillation (HCC)  Chronic kidney disease, stage 4 (severe) (HCC)  Prediabetes -     Hemoglobin A1c  Mixed hyperlipidemia -     CBC with Differential/Platelet -     Comprehensive metabolic panel -     Lipid panel  Pacemaker  Encounter for osteoporosis screening in asymptomatic postmenopausal patient -     DG Bone Density; Future  History of recurrent UTIs -     Nitrofurantoin Monohyd Macro; Take 1 capsule (100 mg total) by mouth daily.  Dispense: 30 capsule; Refill: 2 -     POCT URINALYSIS DIP (CLINITEK)     Meds ordered this encounter  Medications   nitrofurantoin, macrocrystal-monohydrate, (MACROBID) 100 MG capsule    Sig: Take 1 capsule (100 mg total) by mouth daily.    Dispense:  30 capsule    Refill:  2    Orders Placed This Encounter  Procedures   DG Bone Density   CBC with Differential/Platelet   Comprehensive metabolic panel   Hemoglobin A1c   Lipid panel   POCT URINALYSIS DIP (CLINITEK)     Follow-up: Return in about 3 months (around 12/29/2023) for chronic follow up.   I,Marla I Leal-Borjas,acting as a scribe for Blane Ohara, MD.,have documented all relevant documentation on the behalf of Blane Ohara, MD,as directed by  Blane Ohara, MD while in the presence of Blane Ohara, MD.   An After Visit Summary was printed and given to the patient.  I attest that I have reviewed this visit and agree with the plan scribed by my staff.   Blane Ohara, MD [facility] [phone]

## 2023-09-28 ENCOUNTER — Encounter: Payer: Self-pay | Admitting: Family Medicine

## 2023-09-28 ENCOUNTER — Ambulatory Visit: Payer: Medicare Other | Admitting: Family Medicine

## 2023-09-28 VITALS — BP 120/80 | HR 74 | Temp 97.3°F | Resp 14 | Ht 63.0 in | Wt 128.0 lb

## 2023-09-28 DIAGNOSIS — Z23 Encounter for immunization: Secondary | ICD-10-CM

## 2023-09-28 DIAGNOSIS — Z780 Asymptomatic menopausal state: Secondary | ICD-10-CM

## 2023-09-28 DIAGNOSIS — N184 Chronic kidney disease, stage 4 (severe): Secondary | ICD-10-CM

## 2023-09-28 DIAGNOSIS — I482 Chronic atrial fibrillation, unspecified: Secondary | ICD-10-CM

## 2023-09-28 DIAGNOSIS — Z0000 Encounter for general adult medical examination without abnormal findings: Secondary | ICD-10-CM

## 2023-09-28 DIAGNOSIS — R3129 Other microscopic hematuria: Secondary | ICD-10-CM

## 2023-09-28 DIAGNOSIS — E782 Mixed hyperlipidemia: Secondary | ICD-10-CM

## 2023-09-28 DIAGNOSIS — R7303 Prediabetes: Secondary | ICD-10-CM | POA: Diagnosis not present

## 2023-09-28 DIAGNOSIS — Z950 Presence of cardiac pacemaker: Secondary | ICD-10-CM

## 2023-09-28 DIAGNOSIS — M1A0710 Idiopathic chronic gout, right ankle and foot, without tophus (tophi): Secondary | ICD-10-CM

## 2023-09-28 DIAGNOSIS — Z87440 Personal history of urinary (tract) infections: Secondary | ICD-10-CM | POA: Diagnosis not present

## 2023-09-28 DIAGNOSIS — Z13820 Encounter for screening for osteoporosis: Secondary | ICD-10-CM

## 2023-09-28 LAB — POCT URINALYSIS DIP (CLINITEK)
Bilirubin, UA: NEGATIVE
Glucose, UA: NEGATIVE mg/dL
Ketones, POC UA: NEGATIVE mg/dL
Nitrite, UA: NEGATIVE
POC PROTEIN,UA: NEGATIVE
Spec Grav, UA: <=1.005 — AB (ref 1.010–1.025)
Urobilinogen, UA: 0.2 U/dL
pH, UA: 7 (ref 5.0–8.0)

## 2023-09-28 MED ORDER — NITROFURANTOIN MONOHYD MACRO 100 MG PO CAPS: 100.0000 mg | ORAL_CAPSULE | Freq: Every day | ORAL | 2 refills | Status: DC

## 2023-09-28 NOTE — Progress Notes (Unsigned)
 Subjective:   Tracy Vasquez is a 89 y.o. female who presents for Medicare Annual (Subsequent) preventive examination.  Visit Complete: In person  Patient Medicare AWV questionnaire was completed by the patient; I have confirmed that all information answered by patient is correct and no changes since this date.  Cardiac Risk Factors include: advanced age (>53men, >40 women)     Objective:    Today's Vitals   09/28/23 1326  BP: 120/80  Pulse: 74  Resp: 14  Temp: (!) 97.3 F (36.3 C)  SpO2: 97%  Weight: 128 lb (58.1 kg)  Height: 5\' 3"  (1.6 m)   Body mass index is 22.67 kg/m.     09/28/2023    2:15 PM 09/03/2022   10:08 AM 04/16/2021    1:27 PM 08/16/2017    8:27 AM 01/27/2017   11:00 PM 01/27/2017    6:24 PM 11/22/2011    5:59 PM  Advanced Directives  Does Patient Have a Medical Advance Directive? Yes Yes No Yes Yes No Patient has advance directive, copy not in chart  Type of Advance Directive Healthcare Power of [employer] of [place];Out of facility DNR (pink MOST or yellow form)  Healthcare Power of [place];Living will Living will  Living will  Does patient want to make changes to medical advance directive? No - Patient declined   No - Patient declined No - Patient declined    Copy of Healthcare Power of Attorney in Chart?  No - copy requested  No - copy requested     Would patient like information on creating a medical advance directive?   No - Patient declined        Current Medications (verified) Outpatient Encounter Medications as of 09/28/2023  Medication Sig   acetaminophen (TYLENOL) 500 MG tablet Take 1,000 mg by mouth every 6 (six) hours as needed for mild pain.   dapagliflozin propanediol (FARXIGA) 5 MG TABS tablet TAKE ONE TABLET BY MOUTH DAILY BEFORE BREAKFAST   digoxin (LANOXIN) 0.125 MG tablet TAKE 1/2 TABLET BY MOUTH EVERY OTHER DAY   estradiol (ESTRACE) 0.1 MG/GM vaginal cream Place 1 Applicatorful vaginally every other day.   febuxostat  (ULORIC) 40 MG tablet TAKE 1/2 TABLET BY MOUTH DAILY   furosemide (LASIX) 20 MG tablet 2 in am and 3 in pm.   LORazepam (ATIVAN) 0.5 MG tablet TAKE ONE TABLET BY MOUTH AT BEDTIME   metoprolol tartrate (LOPRESSOR) 100 MG tablet Take 1 tablet (100 mg total) by mouth 2 (two) times daily.   pravastatin (PRAVACHOL) 20 MG tablet TAKE 1 TABLET(20 MG) BY MOUTH DAILY   Rivaroxaban (XARELTO) 15 MG TABS tablet TAKE ONE TABLET BY MOUTH DAILY WITH SUPPER   [DISCONTINUED] nitrofurantoin, macrocrystal-monohydrate, (MACROBID) 100 MG capsule Take 1 capsule (100 mg total) by mouth daily.   [DISCONTINUED] Rivaroxaban (XARELTO) 15 MG TABS tablet Take 1 tablet (15 mg total) by mouth daily with supper.   nitrofurantoin, macrocrystal-monohydrate, (MACROBID) 100 MG capsule Take 1 capsule (100 mg total) by mouth daily.   No facility-administered encounter medications on file as of 09/28/2023.    Allergies (verified) Patient has no known allergies.   History: Past Medical History:  Diagnosis Date   Acute gangrenous appendicitis with perforation and peritonitis 11/22/2011   Afib (HCC) 2008   CVA (cerebral infarction) 2008   no residual deficits   Osteoporosis    Tachycardia-bradycardia syndrome (HCC) 01/28/2021   Past Surgical History:  Procedure Laterality Date   INSERT / REPLACE / REMOVE PACEMAKER  LAPAROSCOPIC APPENDECTOMY  11/22/2011   Procedure: APPENDECTOMY LAPAROSCOPIC;  Surgeon: Almond Lint, MD;  Location: [facility] OR;  Service: General;  Laterality: N/A;   PACEMAKER INSERTION     PPM GENERATOR CHANGEOUT N/A 08/16/2017   Procedure: PPM GENERATOR CHANGEOUT;  Surgeon: Marinus Maw, MD;  Location: [facility] INVASIVE CV LAB;  Service: Cardiovascular;  Laterality: N/A;   RIGHT/LEFT HEART CATH AND CORONARY ANGIOGRAPHY N/A 02/01/2017   Procedure: Right/Left Heart Cath and Coronary Angiography;  Surgeon: Lennette Bihari, MD;  Location: [facility] INVASIVE CV LAB;  Service: Cardiovascular;  Laterality: N/A;   TONSILLECTOMY  AND ADENOIDECTOMY  1935   Family History  Problem Relation Age of Onset   Coronary artery disease Mother    Heart attack Mother    Atrial fibrillation Father    Lung cancer Father    Alcohol abuse Brother    Failure to thrive Brother    Social History   Socioeconomic History   Marital status: Widowed    Spouse name: Not on file   Number of children: 3   Years of education: Not on file   Highest education level: Not on file  Occupational History   Occupation: Retired  Tobacco Use   Smoking status: Former    Current packs/day: 0.00    Types: Cigarettes    Quit date: 1975    Years since quitting: 50.2   Smokeless tobacco: Never  Vaping Use   Vaping status: Never Used  Substance and Sexual Activity   Alcohol use: No   Drug use: No   Sexual activity: Not Currently  Other Topics Concern   Not on file  Social History Narrative   Lives with dtr, moved to [place] from [place] in 2014, widowed early 2013 (60 years married)   Social Drivers of Corporate investment banker Strain: Low Risk  (09/28/2023)   Overall Financial Resource Strain (CARDIA)    Difficulty of Paying Living Expenses: Not hard at all  Food Insecurity: No Food Insecurity (09/28/2023)   Hunger Vital Sign    Worried About Running Out of Food in the Last Year: Never true    Ran Out of Food in the Last Year: Never true  Transportation Needs: No Transportation Needs (09/03/2022)   PRAPARE - Administrator, Civil Service (Medical): No    Lack of Transportation (Non-Medical): No  Physical Activity: Sufficiently Active (09/03/2022)   Exercise Vital Sign    Days of Exercise per Week: 3 days    Minutes of Exercise per Session: 60 min  Stress: No Stress Concern Present (09/28/2023)   [employer] of Occupational Health - Occupational Stress Questionnaire    Feeling of Stress : Not at all  Social Connections: Socially Isolated (09/03/2022)   Social Connection and Isolation Panel [NHANES]     Frequency of Communication with Friends and Family: More than three times a week    Frequency of Social Gatherings with Friends and Family: More than three times a week    Attends Religious Services: Never    Database administrator or Organizations: No    Attends Banker Meetings: Never    Marital Status: Widowed    Tobacco Counseling Counseling given: Not Answered   Clinical Intake:                        Activities of Daily Living    09/28/2023    2:10 PM  In your present state of health,  do you have any difficulty performing the following activities:  Hearing? 1  Vision? 0  Difficulty concentrating or making decisions? 0  Walking or climbing stairs? 1  Dressing or bathing? 0  Doing errands, shopping? 0  Preparing Food and eating ? N  Using the Toilet? N  In the past six months, have you accidently leaked urine? N  Do you have problems with loss of bowel control? N  Managing your Medications? N  Managing your Finances? N  Housekeeping or managing your Housekeeping? N    Patient Care Team: Blane Ohara, MD as PCP - General (Family Medicine) Marinus Maw, MD (Cardiology) Estanislado Emms, MD as Consulting Physician (Nephrology)  Indicate any recent Medical Services you may have received from other than [facility] providers in the past year (date may be approximate).     Assessment:   This is a routine wellness examination for [place].  Hearing/Vision screen No results found.   Goals Addressed   None   Depression Screen    09/28/2023    2:17 PM 10/27/2022    1:38 PM 09/03/2022   10:10 AM 08/06/2021   10:16 AM 03/27/2020    1:47 PM 01/01/2014   11:27 AM 12/29/2012   10:26 AM  PHQ 2/9 Scores  PHQ - 2 Score 0 0 0 0 0 0 0    Fall Risk    10/27/2022    1:38 PM 09/03/2022   10:09 AM 02/10/2022   10:56 AM 01/15/2021   11:18 AM 08/28/2020    1:36 PM  Fall Risk   Falls in the past year? 1 1 0 0 0  Number falls in past yr: 0 0 0 0 0  Injury with  Fall? 1 1 0 0 0  Risk for fall due to : Impaired balance/gait;Impaired mobility Impaired balance/gait;Impaired mobility Impaired balance/gait Impaired mobility   Follow up  Falls prevention discussed Falls prevention discussed  Falls evaluation completed    MEDICARE RISK AT HOME: Medicare Risk at Home Any stairs in or around the home?: No If so, are there any without handrails?: No Home free of loose throw rugs in walkways, pet beds, electrical cords, etc?: No Adequate lighting in your home to reduce risk of falls?: No Life alert?: No Use of a cane, walker or w/c?: Yes Grab bars in the bathroom?: Yes Shower chair or bench in shower?: Yes Elevated toilet seat or a handicapped toilet?: Yes  TIMED UP AND GO:  Was the test performed?  Yes  Length of time to ambulate 10 feet: 2 sec Gait steady and fast without use of assistive device    Cognitive Function:        09/28/2023    2:17 PM 09/03/2022   10:13 AM  6CIT Screen  What Year? 0 points 0 points  What month? 0 points 0 points  What time? 0 points 0 points  Count back from 20 0 points 0 points  Months in reverse 0 points 0 points  Repeat phrase 0 points 0 points  Total Score 0 points 0 points    Immunizations Immunization History  Administered Date(s) Administered   Fluad Quad(high Dose 65+) 04/19/2021   Fluad Trivalent(High Dose 65+) 06/23/2023   Influenza, High Dose Seasonal PF 04/20/2013, 04/09/2014, 04/23/2016, 04/07/2017, 05/04/2018, 05/10/2019   Moderna Sars-Covid-2 Vaccination 02/17/2020, 03/16/2020   PNEUMOCOCCAL CONJUGATE-20 09/28/2023   Pneumococcal Polysaccharide-23 07/06/2006   Pneumococcal-Unspecified 07/06/2006   Zoster, Live 12/05/2006    Screening Tests Health Maintenance  Topic Date Due  Zoster Vaccines- Shingrix (1 of 2) 12/02/2023 (Originally [birth year]26/1981)   Medicare Annual Wellness (AWV)  09/27/2024   Pneumonia Vaccine 10+ Years old  Completed   INFLUENZA VACCINE  Completed   DEXA SCAN   Completed   HPV VACCINES  Aged Out   DTaP/Tdap/Td  Discontinued   COVID-19 Vaccine  Discontinued    Health Maintenance  There are no preventive care reminders to display for this patient.   Additional Screening:  Vision Screening: Recommended annual ophthalmology exams for early detection of glaucoma and other disorders of the eye. Is the patient up to date with their annual eye exam?  {YES/NO:21197} Who is the provider or what is the name of the office in which the patient attends annual eye exams? *** If pt is not established with a provider, would they like to be referred to a provider to establish care? {YES/NO:21197}.   Dental Screening: Recommended annual dental exams for proper oral hygiene  Community Resource Referral / Chronic Care Management: CRR required this visit?  No   CCM required this visit?  No     Plan:     I have personally reviewed and noted the following in the patient's chart:   Medical and social history Use of alcohol, tobacco or illicit drugs  Current medications and supplements including opioid prescriptions. Patient is not currently taking opioid prescriptions. Functional ability and status Nutritional status Physical activity Advanced directives List of other physicians Hospitalizations, surgeries, and ER visits in previous 12 months Vitals Screenings to include cognitive, depression, and falls Referrals and appointments  In addition, I have reviewed and discussed with patient certain preventive protocols, quality metrics, and best practice recommendations. A written personalized care plan for preventive services as well as general preventive health recommendations were provided to patient.

## 2023-09-29 DIAGNOSIS — Z87440 Personal history of urinary (tract) infections: Secondary | ICD-10-CM | POA: Insufficient documentation

## 2023-09-29 DIAGNOSIS — Z0000 Encounter for general adult medical examination without abnormal findings: Secondary | ICD-10-CM | POA: Insufficient documentation

## 2023-09-29 DIAGNOSIS — R3129 Other microscopic hematuria: Secondary | ICD-10-CM | POA: Insufficient documentation

## 2023-09-29 NOTE — Assessment & Plan Note (Addendum)
 A1c was 6.1 at last check. Condition is well-managed with current lifestyle. Maintains a healthy diet and exercises regularly.

## 2023-09-29 NOTE — Assessment & Plan Note (Signed)
 Sent Macrobid 100 mg daily.

## 2023-09-29 NOTE — Assessment & Plan Note (Signed)
 Positive urine culture in December. No current bladder symptoms. Previously prescribed prophylactic antibiotic for recurrent UTIs, but prescription was not filled. Agrees to start prophylactic antibiotic therapy. - Send prescription for prophylactic antibiotic for UTI prevention.

## 2023-09-29 NOTE — Addendum Note (Signed)
 Addended by: Elease Etienne A on: 09/29/2023 10:43 AM   Modules accepted: Orders

## 2023-09-29 NOTE — Assessment & Plan Note (Signed)
Well controlled.  No changes to medicines. Continue pravastatin 20 mg before bed.  Continue to work on eating a healthy diet and exercise.  Labs drawn today.

## 2023-09-29 NOTE — Assessment & Plan Note (Signed)
 Managed with Uloric. No new issues.

## 2023-09-29 NOTE — Assessment & Plan Note (Signed)
 Due for pneumonia vaccine and bone density test. Medicare annual wellness visit discussed but she prefers not to return for it. Vitamin D supplementation noted. Lives in a safe environment with adequate support from family and services.  Has a living will and healthcare power of attorney designated to her daughter. Code status updated in 2022 indicating DNR preferences. No legal document on file with the practice. Values independence and has adequate support from family and services. - Obtain a copy of the living will and healthcare power of attorney. - Administer Prevnar 20 vaccine. - Order bone density test.

## 2023-09-29 NOTE — Assessment & Plan Note (Addendum)
 Management per specialist.  Chronic kidney disease with recent nephrologist follow-up. No new issues.

## 2023-09-29 NOTE — Assessment & Plan Note (Signed)
 stable

## 2023-09-29 NOTE — Assessment & Plan Note (Signed)
 Chronic atrial fibrillation and congestive heart failure managed with Xarelto, digoxin, metoprolol, Lasix, and Farxiga. Recent change to generic Farxiga. No new symptoms. Follows up with cardiologist annually, next appointment in August.

## 2023-09-29 NOTE — Progress Notes (Signed)
 Remote pacemaker transmission.

## 2023-09-29 NOTE — Assessment & Plan Note (Deleted)
 The current medical regimen is effective;  continue present plan and medications.  Continue Xarelto 15 mg daily, digoxin 0.125 mg 1/2 daily, metoprolol 100 mg twice daily. Management per specialist.

## 2023-09-30 LAB — COMPREHENSIVE METABOLIC PANEL WITH GFR
ALT: 10 IU/L (ref 0–32)
AST: 18 IU/L (ref 0–40)
Albumin: 4.5 g/dL (ref 3.6–4.6)
Alkaline Phosphatase: 120 IU/L (ref 44–121)
BUN/Creatinine Ratio: 21 (ref 12–28)
BUN: 36 mg/dL (ref 10–36)
Bilirubin Total: 0.4 mg/dL (ref 0.0–1.2)
CO2: 28 mmol/L (ref 20–29)
Calcium: 9.8 mg/dL (ref 8.7–10.3)
Chloride: 97 mmol/L (ref 96–106)
Creatinine, Ser: 1.68 mg/dL — ABNORMAL HIGH (ref 0.57–1.00)
Globulin, Total: 3.4 g/dL (ref 1.5–4.5)
Glucose: 84 mg/dL (ref 70–99)
Potassium: 5.2 mmol/L (ref 3.5–5.2)
Sodium: 138 mmol/L (ref 134–144)
Total Protein: 7.9 g/dL (ref 6.0–8.5)
eGFR: 28 mL/min/{1.73_m2} — ABNORMAL LOW (ref >59)

## 2023-09-30 LAB — CBC WITH DIFFERENTIAL/PLATELET

## 2023-09-30 LAB — HEMOGLOBIN A1C
Est. average glucose Bld gHb Est-mCnc: 134 mg/dL
Hgb A1c MFr Bld: 6.3 % — ABNORMAL HIGH (ref 4.8–5.6)

## 2023-09-30 LAB — LIPID PANEL
Chol/HDL Ratio: 2.7 ratio (ref 0.0–4.4)
Cholesterol, Total: 144 mg/dL (ref 100–199)
HDL: 54 mg/dL (ref >39)
LDL Chol Calc (NIH): 71 mg/dL (ref 0–99)
Triglycerides: 107 mg/dL (ref 0–149)
VLDL Cholesterol Cal: 19 mg/dL (ref 5–40)

## 2023-10-03 LAB — URINE CULTURE

## 2023-10-04 ENCOUNTER — Telehealth: Payer: Self-pay

## 2023-10-04 NOTE — Telephone Encounter (Signed)
 Received notification from [facility] that farxiga is not on formulary and would require an PA. Initiated and submitted PA via covermymeds, "This medication or product is on your plan's list of covered drugs. Prior authorization is not required at this time. If your pharmacy has questions regarding the processing of your prescription, please have them call the [facility] pharmacy help desk at [phone]. **Please note: This request was submitted electronically. Formulary lowering, tiering exception, cost reduction and/or pre-benefit determination review (including prospective Medicare hospice reviews) requests cannot be requested using this method of submission. Providers contact us at [phone] for further assistance."  Key: [id number]

## 2023-10-04 NOTE — Telephone Encounter (Signed)
 Patient informed and verbalized understanding

## 2023-10-04 NOTE — Telephone Encounter (Addendum)
 Patient questioned how long she is supposed to take the macrobid at the increased dose. States she was taking it once daily and was increased to BID. Questioned if increased dose is permanent?    Copied from CRM [id number]. Topic: Clinical - Medication Question >> Oct 04, 2023 10:08 AM Louie Casa B wrote: Reason for CRM: patient wants to know how long she is supposed to take nitrofurantoin, macrocrystal-monohydrate, (MACROBID) 100 MG capsule Please call patient [phone]

## 2023-10-12 ENCOUNTER — Ambulatory Visit (INDEPENDENT_AMBULATORY_CARE_PROVIDER_SITE_OTHER)

## 2023-10-12 DIAGNOSIS — N3001 Acute cystitis with hematuria: Secondary | ICD-10-CM | POA: Diagnosis not present

## 2023-10-12 LAB — POCT URINALYSIS DIP (CLINITEK)
Bilirubin, UA: NEGATIVE
Glucose, UA: NEGATIVE mg/dL
Ketones, POC UA: NEGATIVE mg/dL
Nitrite, UA: NEGATIVE
POC PROTEIN,UA: NEGATIVE
Spec Grav, UA: 1.01 (ref 1.010–1.025)
Urobilinogen, UA: 0.2 U/dL
pH, UA: 6 (ref 5.0–8.0)

## 2023-10-12 NOTE — Progress Notes (Signed)
 Patient is here for recheck UA. She finished antibiotic Macrobid. Her urine had some blood clot and she is concerned about this. Dr Faylene Kurtz recommended to send urine to culture and treat it according to the sensitivity.  Patient was informed and she verbalized to understand.

## 2023-10-13 LAB — URINE CULTURE

## 2023-10-14 ENCOUNTER — Other Ambulatory Visit (HOSPITAL_BASED_OUTPATIENT_CLINIC_OR_DEPARTMENT_OTHER): Admitting: Radiology

## 2023-10-14 ENCOUNTER — Encounter: Payer: Self-pay | Admitting: Family Medicine

## 2023-10-14 ENCOUNTER — Other Ambulatory Visit: Payer: Self-pay

## 2023-10-14 ENCOUNTER — Ambulatory Visit: Payer: Self-pay | Admitting: *Deleted

## 2023-10-14 ENCOUNTER — Ambulatory Visit

## 2023-10-14 ENCOUNTER — Other Ambulatory Visit: Payer: Self-pay | Admitting: Family Medicine

## 2023-10-14 MED ORDER — DOXYCYCLINE HYCLATE 100 MG PO TABS: 100.0000 mg | ORAL_TABLET | Freq: Two times a day (BID) | ORAL | 0 refills | Status: DC

## 2023-10-14 NOTE — Telephone Encounter (Signed)
 Discuss with patient at her appointment. Dr. Sedalia Muta

## 2023-10-14 NOTE — Telephone Encounter (Signed)
  Chief Complaint: vaginal area pain moderate to severe, s/p UTI taking antibiotics Symptoms: vaginal pain denies pain with urination. No discharge reported pain started after taking antibiotics and farxiga per patient Frequency: last night  Pertinent Negatives: Patient denies fever no vaginal discharge or pain with urination. No blood in urine  Disposition: [] ED /[] Urgent Care (no appt availability in office) / [x] Appointment(In office/virtual)/ []  [facility]/ [] Home Care/ [] Refused Recommended Disposition /[] [facility]/ []  Follow-up with PCP Additional Notes:   Appt scheduled for tomorrow with PCP. Please advise for any recommendations until OV. Recommended if sx worsen call back .     Copied from CRM [id number]. Topic: Clinical - Red Word Triage >> Oct 14, 2023  8:19 AM Elle L wrote: Red Word that prompted transfer to Nurse Triage: The patient is waiting for her urine culture results and she finished her 10 day course of antibiotics on Friday. However, the patient states that she has been having pain from it again last night and this morning.   Her Nephrologist advised that Marcelline Deist may be causing her UTI's and she wants to see if she can have a replacement medication as well. Reason for Disposition  All other patients with painful urination  (Exception: [1] EITHER frequency or urgency AND [2] has on-call doctor.)    No pain with urination but pain constant vaginal area  Answer Assessment - Initial Assessment Questions 1. SEVERITY: "How bad is the pain?"  (e.g., Scale 1-10; mild, moderate, or severe)   - MILD (1-3): complains slightly about urination hurting   - MODERATE (4-7): interferes with normal activities     - SEVERE (8-10): excruciating, unwilling or unable to urinate because of the pain      Moderate  to severe 8/10 2. FREQUENCY: "How many times have you had painful urination today?"      Na  3. PATTERN: "Is pain present every time you urinate or just  sometimes?"      na 4. ONSET: "When did the painful urination start?"      na 5. FEVER: "Do you have a fever?" If Yes, ask: "What is your temperature, how was it measured, and when did it start?"     na 6. PAST UTI: "Have you had a urine infection before?" If Yes, ask: "When was the last time?" and "What happened that time?"      Yes last Tuesday  7. CAUSE: "What do you think is causing the painful urination?"  (e.g., UTI, scratch, Herpes sore)     Pain in vaginal area not with urinating  8. OTHER SYMPTOMS: "Do you have any other symptoms?" (e.g., blood in urine, flank pain, genital sores, urgency, vaginal discharge)     Pain in vaginal area.  9. PREGNANCY: "Is there any chance you are pregnant?" "When was your last menstrual period?"     na  Protocols used: Urination Pain - Female-A-AH

## 2023-10-14 NOTE — Progress Notes (Signed)
 Acute Office Visit  Subjective:    Patient ID: Tracy Vasquez, female    DOB: 11/10/1929, 89 y.o.   MRN: [id number]  Chief Complaint  Patient presents with   Urinary Tract Infection   HPI: Patient is in today for UTI-currently on abx. C/o vaginal pain but unable to describe. Pain is not constant. Flaking in urine. Patient is concerned that farxiga .   Past Medical History:  Diagnosis Date   Acute gangrenous appendicitis with perforation and peritonitis 11/22/2011   Afib (HCC) 2008   CVA (cerebral infarction) 2008   no residual deficits   Osteoporosis    Tachycardia-bradycardia syndrome (HCC) 01/28/2021    Past Surgical History:  Procedure Laterality Date   INSERT / REPLACE / REMOVE PACEMAKER     LAPAROSCOPIC APPENDECTOMY  11/22/2011   Procedure: APPENDECTOMY LAPAROSCOPIC;  Surgeon: Lockie Rima, MD;  Location: [facility] OR;  Service: General;  Laterality: N/A;   PACEMAKER INSERTION     PPM GENERATOR CHANGEOUT N/A 08/16/2017   Procedure: PPM GENERATOR CHANGEOUT;  Surgeon: Tammie Fall, MD;  Location: [facility] INVASIVE CV LAB;  Service: Cardiovascular;  Laterality: N/A;   RIGHT/LEFT HEART CATH AND CORONARY ANGIOGRAPHY N/A 02/01/2017   Procedure: Right/Left Heart Cath and Coronary Angiography;  Surgeon: Millicent Ally, MD;  Location: [facility] INVASIVE CV LAB;  Service: Cardiovascular;  Laterality: N/A;   TONSILLECTOMY AND ADENOIDECTOMY  1935    Family History  Problem Relation Age of Onset   Coronary artery disease Mother    Heart attack Mother    Atrial fibrillation Father    Lung cancer Father    Alcohol abuse Brother    Failure to thrive Brother     Social History   Socioeconomic History   Marital status: Widowed    Spouse name: Not on file   Number of children: 3   Years of education: Not on file   Highest education level: Not on file  Occupational History   Occupation: Retired  Tobacco Use   Smoking status: Former    Current packs/day: 0.00    Types: Cigarettes    Quit  date: 1975    Years since quitting: 50.3   Smokeless tobacco: Never  Vaping Use   Vaping status: Never Used  Substance and Sexual Activity   Alcohol use: No   Drug use: No   Sexual activity: Not Currently  Other Topics Concern   Not on file  Social History Narrative   Lives with dtr, moved to [place] from [place] in 2014, widowed early 2013 (60 years married)   Social Drivers of Corporate investment banker Strain: Low Risk  (09/28/2023)   Overall Financial Resource Strain (CARDIA)    Difficulty of Paying Living Expenses: Not hard at all  Food Insecurity: No Food Insecurity (09/28/2023)   Hunger Vital Sign    Worried About Running Out of Food in the Last Year: Never true    Ran Out of Food in the Last Year: Never true  Transportation Needs: No Transportation Needs (09/03/2022)   PRAPARE - Administrator, Civil Service (Medical): No    Lack of Transportation (Non-Medical): No  Physical Activity: Sufficiently Active (09/03/2022)   Exercise Vital Sign    Days of Exercise per Week: 3 days    Minutes of Exercise per Session: 60 min  Stress: No Stress Concern Present (09/28/2023)   [employer] of Occupational Health - Occupational Stress Questionnaire    Feeling of Stress : Not at all  Social Connections: Socially Isolated (09/03/2022)   Social Connection and Isolation Panel [NHANES]    Frequency of Communication with Friends and Family: More than three times a week    Frequency of Social Gatherings with Friends and Family: More than three times a week    Attends Religious Services: Never    Database administrator or Organizations: No    Attends Banker Meetings: Never    Marital Status: Widowed  Intimate Partner Violence: Not At Risk (09/03/2022)   Humiliation, Afraid, Rape, and Kick questionnaire    Fear of Current or Ex-Partner: No    Emotionally Abused: No    Physically Abused: No    Sexually Abused: No    Outpatient Medications Prior to Visit   Medication Sig Dispense Refill   acetaminophen  (TYLENOL ) 500 MG tablet Take 1,000 mg by mouth every 6 (six) hours as needed for mild pain.     dapagliflozin  propanediol (FARXIGA ) 5 MG TABS tablet TAKE ONE TABLET BY MOUTH DAILY BEFORE BREAKFAST 30 tablet 1   digoxin  (LANOXIN ) 0.125 MG tablet TAKE 1/2 TABLET BY MOUTH EVERY OTHER DAY 45 tablet 3   doxycycline  (VIBRA -TABS) 100 MG tablet Take 1 tablet (100 mg total) by mouth 2 (two) times daily. 14 tablet 0   estradiol  (ESTRACE ) 0.1 MG/GM vaginal cream Place 1 Applicatorful vaginally every other day. 42.5 g 3   febuxostat  (ULORIC ) 40 MG tablet TAKE 1/2 TABLET BY MOUTH DAILY 30 tablet 0   furosemide  (LASIX ) 20 MG tablet 2 in am and 3 in pm. 1 tablet 0   LORazepam  (ATIVAN ) 0.5 MG tablet TAKE ONE TABLET BY MOUTH AT BEDTIME 30 tablet 2   metoprolol  tartrate (LOPRESSOR ) 100 MG tablet Take 1 tablet (100 mg total) by mouth 2 (two) times daily. 180 tablet 3   nitrofurantoin , macrocrystal-monohydrate, (MACROBID ) 100 MG capsule Take 1 capsule (100 mg total) by mouth daily. 30 capsule 2   pravastatin  (PRAVACHOL ) 20 MG tablet TAKE 1 TABLET(20 MG) BY MOUTH DAILY 90 tablet 1   Rivaroxaban  (XARELTO ) 15 MG TABS tablet TAKE ONE TABLET BY MOUTH DAILY WITH SUPPER 90 tablet 1   No facility-administered medications prior to visit.    No Known Allergies  Review of Systems  Constitutional:  Negative for chills and fever.  Gastrointestinal:  Negative for abdominal pain and nausea.  Genitourinary:  Positive for dysuria and vaginal pain.  Musculoskeletal:  Negative for back pain.       Objective:        10/15/2023    9:35 AM 09/28/2023    1:26 PM 06/23/2023    9:49 AM  Vitals with BMI  Height 5\' 3"  5\' 3"  5\' 3"   Weight 127 lbs 128 lbs 130 lbs  BMI 22.5 22.68 23.03  Systolic 124 120 202  Diastolic 72 80 68  Pulse 66 74 64    No data found.   Physical Exam Vitals reviewed. Exam conducted with a chaperone present.  Constitutional:      Appearance:  Normal appearance.  Cardiovascular:     Rate and Rhythm: Normal rate and regular rhythm.     Heart sounds: Normal heart sounds.  Pulmonary:     Effort: Pulmonary effort is normal.     Breath sounds: Normal breath sounds.  Abdominal:     Tenderness: There is no abdominal tenderness.  Genitourinary:    General: Normal vulva.     Exam position: Lithotomy position.     Labia:  Right: No rash or tenderness.        Left: No rash or tenderness.      Urethra: No urethral pain, urethral swelling or urethral lesion.     Vagina: No vaginal discharge.     Cervix: Normal.  Neurological:     Mental Status: She is alert.     There are no preventive care reminders to display for this patient.  There are no preventive care reminders to display for this patient.   Lab Results  Component Value Date   TSH 2.190 03/05/2017   Lab Results  Component Value Date   WBC CANCELED 09/28/2023   HGB CANCELED 09/28/2023   HCT CANCELED 09/28/2023   MCV 94 06/23/2023   PLT CANCELED 09/28/2023   Lab Results  Component Value Date   NA 138 09/28/2023   K 5.2 09/28/2023   CO2 28 09/28/2023   GLUCOSE 84 09/28/2023   BUN 36 09/28/2023   CREATININE 1.68 (H) 09/28/2023   BILITOT 0.4 09/28/2023   ALKPHOS 120 09/28/2023   AST 18 09/28/2023   ALT 10 09/28/2023   PROT 7.9 09/28/2023   ALBUMIN 4.5 09/28/2023   CALCIUM 9.8 09/28/2023   ANIONGAP 9 04/19/2021   EGFR 28 (L) 09/28/2023   GFR 35.27 (L) 04/25/2014   Lab Results  Component Value Date   CHOL 144 09/28/2023   Lab Results  Component Value Date   HDL 54 09/28/2023   Lab Results  Component Value Date   LDLCALC 71 09/28/2023   Lab Results  Component Value Date   TRIG 107 09/28/2023   Lab Results  Component Value Date   CHOLHDL 2.7 09/28/2023   Lab Results  Component Value Date   HGBA1C 6.3 (H) 09/28/2023       Assessment & Plan:    No orders of the defined types were placed in this encounter.   Orders Placed This  Encounter  Procedures   Urine Culture   POCT URINALYSIS DIP (CLINITEK)     Follow-up: Return in about 2 weeks (around 10/29/2023) for Nurse visit urinalysis.  An After Visit Summary was printed and given to the patient.   Gladys Lamp I Leal-Borjas,acting as a scribe for Mercy Stall, MD.,have documented all relevant documentation on the behalf of Mercy Stall, MD,as directed by  Mercy Stall, MD while in the presence of Mercy Stall, MD.   I attest that I have reviewed this visit and agree with the plan scribed by my staff.   Mercy Stall, MD [facility] [phone]

## 2023-10-15 ENCOUNTER — Ambulatory Visit (HOSPITAL_BASED_OUTPATIENT_CLINIC_OR_DEPARTMENT_OTHER)
Admission: RE | Admit: 2023-10-15 | Discharge: 2023-10-15 | Disposition: A | Discharge status: Home / Self Care | Source: Ambulatory Visit | Attending: Family Medicine | Admitting: Family Medicine

## 2023-10-15 ENCOUNTER — Ambulatory Visit: Admitting: Family Medicine

## 2023-10-15 VITALS — BP 124/72 | HR 66 | Temp 98.2°F | Ht 63.0 in | Wt 127.0 lb

## 2023-10-15 DIAGNOSIS — N3001 Acute cystitis with hematuria: Secondary | ICD-10-CM

## 2023-10-15 DIAGNOSIS — N952 Postmenopausal atrophic vaginitis: Secondary | ICD-10-CM | POA: Diagnosis not present

## 2023-10-15 DIAGNOSIS — Z13820 Encounter for screening for osteoporosis: Secondary | ICD-10-CM | POA: Diagnosis not present

## 2023-10-15 DIAGNOSIS — Z780 Asymptomatic menopausal state: Secondary | ICD-10-CM

## 2023-10-15 LAB — POCT URINALYSIS DIP (CLINITEK)
Bilirubin, UA: NEGATIVE
Glucose, UA: NEGATIVE mg/dL
Ketones, POC UA: NEGATIVE mg/dL
Nitrite, UA: NEGATIVE
POC PROTEIN,UA: NEGATIVE
Spec Grav, UA: <=1.005 — AB (ref 1.010–1.025)
Urobilinogen, UA: 0.2 U/dL
pH, UA: 7 (ref 5.0–8.0)

## 2023-10-15 NOTE — Patient Instructions (Signed)
 Recommend stop farxiga.  Take doxycycline 100 mg one twice a day x 7 days.  Restart nitrofurantoin once daily.  Recheck urine in 2 weeks.

## 2023-10-17 NOTE — Assessment & Plan Note (Addendum)
 Hold farxiga . Check UA Order urine Culture Continue doxycycline  100 mg one twice a day x 7 days.  Restart nitrofurantoin  once daily.

## 2023-10-18 ENCOUNTER — Encounter: Payer: Self-pay | Admitting: Family Medicine

## 2023-10-18 LAB — URINE CULTURE

## 2023-10-24 ENCOUNTER — Encounter: Payer: Self-pay | Admitting: Family Medicine

## 2023-10-24 NOTE — Assessment & Plan Note (Signed)
 Continue estradiol cream.

## 2023-10-27 ENCOUNTER — Telehealth: Payer: Self-pay

## 2023-10-27 ENCOUNTER — Ambulatory Visit (INDEPENDENT_AMBULATORY_CARE_PROVIDER_SITE_OTHER)

## 2023-10-27 ENCOUNTER — Other Ambulatory Visit: Payer: Self-pay | Admitting: Family Medicine

## 2023-10-27 DIAGNOSIS — N3001 Acute cystitis with hematuria: Secondary | ICD-10-CM

## 2023-10-27 LAB — POCT URINALYSIS DIP (CLINITEK)
Bilirubin, UA: NEGATIVE
Glucose, UA: NEGATIVE mg/dL
Ketones, POC UA: NEGATIVE mg/dL
Nitrite, UA: NEGATIVE
Spec Grav, UA: <=1.005 — AB (ref 1.010–1.025)
Urobilinogen, UA: 0.2 U/dL
pH, UA: 6 (ref 5.0–8.0)

## 2023-10-27 MED ORDER — CIPROFLOXACIN HCL 250 MG PO TABS
250.0000 mg | ORAL_TABLET | Freq: Every day | ORAL | 0 refills | Status: AC
Start: 2023-10-27 — End: 2023-11-03

## 2023-10-27 NOTE — Telephone Encounter (Signed)
 Farxiga  was approved has to be brand name.

## 2023-10-27 NOTE — Progress Notes (Signed)
 Patient came in for repeat UA. See results. Patient denies any abdominal pain/pressure, lower back pain, nausea, dysuria or fever/chills. Per Dr. Reinhold Carbine, UA sent for culture and she will send in ax bx. Patient aware and verbalized understanding.

## 2023-10-27 NOTE — Telephone Encounter (Signed)
 Called patient to schedule annual wellness visit with our nurse practitioner, Delford Felling. The cal was sent to voicemail. I left a message for her to call back.

## 2023-10-29 LAB — URINE CULTURE

## 2023-10-31 ENCOUNTER — Encounter: Payer: Self-pay | Admitting: Family Medicine

## 2023-11-10 ENCOUNTER — Other Ambulatory Visit: Payer: Self-pay | Admitting: Family Medicine

## 2023-11-17 ENCOUNTER — Other Ambulatory Visit: Payer: Self-pay | Admitting: Family Medicine

## 2023-11-22 NOTE — Progress Notes (Signed)
 Subjective:  Patient ID: Tracy Vasquez, female    DOB: 12-25-1929  Age: 89 y.o. MRN: [id number]  Chief Complaint  Patient presents with   Urinary issues    HPI: Patient states she has a residue in her urine and believes because of this she has a UTI which has never cleared up. "When is sprays, a heavy spray there is a sensation, not burning." Patient however denies any UTI symptoms such as fever, chills, lower abd pain, back pain, urinary frequency or urgency and dysuria.      09/28/2023    2:17 PM 10/27/2022    1:38 PM 09/03/2022   10:10 AM 08/06/2021   10:16 AM 03/27/2020    1:47 PM  Depression screen PHQ 2/9  Decreased Interest 0 0 0 0 0  Down, Depressed, Hopeless 0 0 0 0 0  PHQ - 2 Score 0 0 0 0 0        10/01/2023    8:54 PM  Fall Risk   Falls in the past year? 0  Number falls in past yr: 0  Injury with Fall? 0  Risk for fall due to : History of fall(s)  Follow up Falls evaluation completed    Patient Care Team: Mercy Stall, MD as PCP - General (Family Medicine) Tammie Fall, MD (Cardiology) Nan Aver, MD as Consulting Physician (Nephrology)   Review of Systems  Constitutional:  Negative for chills and fever.  HENT:  Negative for congestion and sore throat.   Respiratory:  Negative for cough and shortness of breath.   Cardiovascular:  Negative for chest pain.  Gastrointestinal:  Negative for abdominal pain and nausea.  Genitourinary:  Negative for dysuria, flank pain, frequency and urgency.  Musculoskeletal:  Negative for back pain.    Current Outpatient Medications on File Prior to Visit  Medication Sig Dispense Refill   acetaminophen  (TYLENOL ) 500 MG tablet Take 1,000 mg by mouth every 6 (six) hours as needed for mild pain.     dapagliflozin  propanediol (FARXIGA ) 5 MG TABS tablet TAKE ONE TABLET BY MOUTH DAILY BEFORE BREAKFAST 30 tablet 1   digoxin  (LANOXIN ) 0.125 MG tablet TAKE 1/2 TABLET BY MOUTH EVERY OTHER DAY 45 tablet 3   estradiol  (ESTRACE ) 0.1  MG/GM vaginal cream Place 1 Applicatorful vaginally every other day. 42.5 g 3   febuxostat  (ULORIC ) 40 MG tablet TAKE 1/2 TABLET BY MOUTH DAILY 45 tablet 1   furosemide  (LASIX ) 20 MG tablet 2 in am and 3 in pm. 1 tablet 0   LORazepam  (ATIVAN ) 0.5 MG tablet TAKE ONE TABLET BY MOUTH AT BEDTIME 30 tablet 2   metoprolol  tartrate (LOPRESSOR ) 100 MG tablet Take 1 tablet (100 mg total) by mouth 2 (two) times daily. 180 tablet 3   nitrofurantoin , macrocrystal-monohydrate, (MACROBID ) 100 MG capsule Take 1 capsule (100 mg total) by mouth daily. 30 capsule 2   pravastatin  (PRAVACHOL ) 20 MG tablet TAKE 1 TABLET(20 MG) BY MOUTH DAILY 90 tablet 1   Rivaroxaban  (XARELTO ) 15 MG TABS tablet TAKE ONE TABLET BY MOUTH DAILY WITH SUPPER 90 tablet 1   No current facility-administered medications on file prior to visit.   Past Medical History:  Diagnosis Date   Acute gangrenous appendicitis with perforation and peritonitis 11/22/2011   Afib (HCC) 2008   CVA (cerebral infarction) 2008   no residual deficits   Osteoporosis    Tachycardia-bradycardia syndrome (HCC) 01/28/2021   Past Surgical History:  Procedure Laterality Date   INSERT / REPLACE / REMOVE PACEMAKER  LAPAROSCOPIC APPENDECTOMY  11/22/2011   Procedure: APPENDECTOMY LAPAROSCOPIC;  Surgeon: Lockie Rima, MD;  Location: [facility] OR;  Service: General;  Laterality: N/A;   PACEMAKER INSERTION     PPM GENERATOR CHANGEOUT N/A 08/16/2017   Procedure: PPM GENERATOR CHANGEOUT;  Surgeon: Tammie Fall, MD;  Location: [facility] INVASIVE CV LAB;  Service: Cardiovascular;  Laterality: N/A;   RIGHT/LEFT HEART CATH AND CORONARY ANGIOGRAPHY N/A 02/01/2017   Procedure: Right/Left Heart Cath and Coronary Angiography;  Surgeon: Millicent Ally, MD;  Location: [facility] INVASIVE CV LAB;  Service: Cardiovascular;  Laterality: N/A;   TONSILLECTOMY AND ADENOIDECTOMY  1935    Family History  Problem Relation Age of Onset   Coronary artery disease Mother    Heart attack Mother    Atrial  fibrillation Father    Lung cancer Father    Alcohol abuse Brother    Failure to thrive Brother    Social History   Socioeconomic History   Marital status: Widowed    Spouse name: Not on file   Number of children: 3   Years of education: Not on file   Highest education level: Not on file  Occupational History   Occupation: Retired  Tobacco Use   Smoking status: Former    Current packs/day: 0.00    Types: Cigarettes    Quit date: 1975    Years since quitting: 50.4   Smokeless tobacco: Never  Vaping Use   Vaping status: Never Used  Substance and Sexual Activity   Alcohol use: No   Drug use: No   Sexual activity: Not Currently  Other Topics Concern   Not on file  Social History Narrative   Lives with dtr, moved to [place] from [place] in 2014, widowed early 2013 (60 years married)   Social Drivers of Corporate investment banker Strain: Low Risk  (09/28/2023)   Overall Financial Resource Strain (CARDIA)    Difficulty of Paying Living Expenses: Not hard at all  Food Insecurity: No Food Insecurity (09/28/2023)   Hunger Vital Sign    Worried About Running Out of Food in the Last Year: Never true    Ran Out of Food in the Last Year: Never true  Transportation Needs: No Transportation Needs (11/23/2023)   PRAPARE - Administrator, Civil Service (Medical): No    Lack of Transportation (Non-Medical): No  Physical Activity: Sufficiently Active (11/23/2023)   Exercise Vital Sign    Days of Exercise per Week: 3 days    Minutes of Exercise per Session: 60 min  Stress: No Stress Concern Present (09/28/2023)   [employer] of Occupational Health - Occupational Stress Questionnaire    Feeling of Stress : Not at all  Social Connections: Socially Isolated (09/03/2022)   Social Connection and Isolation Panel [NHANES]    Frequency of Communication with Friends and Family: More than three times a week    Frequency of Social Gatherings with Friends and Family: More than  three times a week    Attends Religious Services: Never    Database administrator or Organizations: No    Attends Banker Meetings: Never    Marital Status: Widowed    Objective:  BP 124/74   Pulse 81   Temp 98 F (36.7 C)   Ht 5\' 3"  (1.6 m)   Wt 125 lb (56.7 kg)   SpO2 97%   BMI 22.14 kg/m      11/23/2023    1:36 PM 10/15/2023  9:35 AM 09/28/2023    1:26 PM  BP/Weight  Systolic BP 124 124 120  Diastolic BP 74 72 80  Wt. (Lbs) 125 127 128  BMI 22.14 kg/m2 22.5 kg/m2 22.67 kg/m2    Physical Exam Vitals reviewed.  Constitutional:      Appearance: Normal appearance. She is normal weight.  Neck:     Vascular: No carotid bruit.  Cardiovascular:     Rate and Rhythm: Normal rate and regular rhythm.     Heart sounds: Normal heart sounds.  Pulmonary:     Effort: Pulmonary effort is normal. No respiratory distress.     Breath sounds: Normal breath sounds.  Abdominal:     General: Abdomen is flat. Bowel sounds are normal.     Palpations: Abdomen is soft.     Tenderness: There is no abdominal tenderness.  Neurological:     Mental Status: She is alert and oriented to person, place, and time.  Psychiatric:        Mood and Affect: Mood normal.        Behavior: Behavior normal.     Diabetic Foot Exam - Simple   No data filed      Lab Results  Component Value Date   WBC CANCELED 09/28/2023   HGB CANCELED 09/28/2023   HCT CANCELED 09/28/2023   PLT CANCELED 09/28/2023   GLUCOSE 84 09/28/2023   CHOL 144 09/28/2023   TRIG 107 09/28/2023   HDL 54 09/28/2023   LDLCALC 71 09/28/2023   ALT 10 09/28/2023   AST 18 09/28/2023   NA 138 09/28/2023   K 5.2 09/28/2023   CL 97 09/28/2023   CREATININE 1.68 (H) 09/28/2023   BUN 36 09/28/2023   CO2 28 09/28/2023   TSH 2.190 03/05/2017   INR 1.15 01/31/2017   HGBA1C 6.3 (H) 09/28/2023      Assessment & Plan:  Abnormal sensation of bladder Assessment & Plan: Rule out enterovesicular fistula.  Check  UA Ordering CT abdomen pelvis w contrast.  Orders: -     POCT URINALYSIS DIP (CLINITEK) -     US  PELVIS (TRANSABDOMINAL ONLY); Future  Acute cystitis with hematuria Assessment & Plan: Check UA Order Urine culture  Orders: -     Urine Culture -     US  PELVIS (TRANSABDOMINAL ONLY); Future  Complete uterovaginal prolapse Assessment & Plan: Referring to Gynecology.  Orders: -     Ambulatory referral to Gynecology -     US  PELVIS (TRANSABDOMINAL ONLY); Future  Abnormal stools Assessment & Plan: Ordering CT Abdomen Pelvis w contrast.  Orders: -     US  PELVIS (TRANSABDOMINAL ONLY); Future     No orders of the defined types were placed in this encounter.   Orders Placed This Encounter  Procedures   Urine Culture   US  PELVIS (TRANSABDOMINAL ONLY)   Ambulatory referral to Gynecology   POCT URINALYSIS DIP (CLINITEK)     Follow-up: Return if symptoms worsen or fail to improve.   I,Marla I Leal-Borjas,acting as a scribe for Mercy Stall, MD.,have documented all relevant documentation on the behalf of Mercy Stall, MD,as directed by  Mercy Stall, MD while in the presence of Mercy Stall, MD.   An After Visit Summary was printed and given to the patient.  Mercy Stall, MD [facility] [phone]

## 2023-11-23 ENCOUNTER — Encounter: Payer: Self-pay | Admitting: Family Medicine

## 2023-11-23 ENCOUNTER — Ambulatory Visit: Admitting: Family Medicine

## 2023-11-23 VITALS — BP 124/74 | HR 81 | Temp 98.0°F | Ht 63.0 in | Wt 125.0 lb

## 2023-11-23 DIAGNOSIS — R3989 Other symptoms and signs involving the genitourinary system: Secondary | ICD-10-CM

## 2023-11-23 DIAGNOSIS — N813 Complete uterovaginal prolapse: Secondary | ICD-10-CM | POA: Diagnosis not present

## 2023-11-23 DIAGNOSIS — R195 Other fecal abnormalities: Secondary | ICD-10-CM

## 2023-11-23 DIAGNOSIS — N3001 Acute cystitis with hematuria: Secondary | ICD-10-CM | POA: Diagnosis not present

## 2023-11-23 LAB — POCT URINALYSIS DIP (CLINITEK)
Bilirubin, UA: NEGATIVE
Glucose, UA: NEGATIVE mg/dL
Ketones, POC UA: NEGATIVE mg/dL
Nitrite, UA: NEGATIVE
POC PROTEIN,UA: NEGATIVE
Spec Grav, UA: 1.01 (ref 1.010–1.025)
Urobilinogen, UA: 0.2 U/dL
pH, UA: 6.5 (ref 5.0–8.0)

## 2023-11-24 ENCOUNTER — Ambulatory Visit: Payer: Self-pay | Admitting: Family Medicine

## 2023-11-24 ENCOUNTER — Telehealth: Payer: Self-pay

## 2023-11-24 DIAGNOSIS — R3989 Other symptoms and signs involving the genitourinary system: Secondary | ICD-10-CM | POA: Insufficient documentation

## 2023-11-24 DIAGNOSIS — R195 Other fecal abnormalities: Secondary | ICD-10-CM | POA: Insufficient documentation

## 2023-11-24 NOTE — Assessment & Plan Note (Addendum)
 Rule out enterovesicular fistula.  Check UA Ordering CT abdomen pelvis w contrast.

## 2023-11-24 NOTE — Assessment & Plan Note (Signed)
 Ordering CT Abdomen Pelvis w contrast.

## 2023-11-24 NOTE — Telephone Encounter (Signed)
 Copied from CRM [id number]. Topic: Referral - Question >> Nov 24, 2023  8:33 AM El Gravely T wrote: Reason for CRM: Patient calling, states seen provider on 11/23/23.   At time of appointment, a bladder scan was ordered. Per patient she remembered that her last Bladder CT Scan was done at [facility] (Dr. Sherrine Dolly) in January/February of 2025.  Patient wanted to make provider/nurse aware of this update.   Please follow up with patient with any additional questions [phone].

## 2023-11-24 NOTE — Assessment & Plan Note (Signed)
Referring to Gynecology.

## 2023-11-24 NOTE — Assessment & Plan Note (Signed)
Check UA Order Urine culture

## 2023-11-25 ENCOUNTER — Telehealth: Payer: Self-pay

## 2023-11-25 ENCOUNTER — Ambulatory Visit (INDEPENDENT_AMBULATORY_CARE_PROVIDER_SITE_OTHER): Payer: Medicare Other

## 2023-11-25 DIAGNOSIS — I495 Sick sinus syndrome: Secondary | ICD-10-CM | POA: Diagnosis not present

## 2023-11-25 LAB — CUP PACEART REMOTE DEVICE CHECK
Battery Remaining Longevity: 18 mo
Battery Remaining Percentage: 31 %
Brady Statistic RA Percent Paced: 0 %
Brady Statistic RV Percent Paced: 27 %
Date Time Interrogation Session: 20250522030000
Implantable Lead Connection Status: 753985
Implantable Lead Connection Status: 753985
Implantable Lead Implant Date: 20080827
Implantable Lead Implant Date: 20080827
Implantable Lead Location: 753859
Implantable Lead Location: 753860
Implantable Lead Model: 4135
Implantable Lead Model: 4136
Implantable Lead Serial Number: 28356762
Implantable Lead Serial Number: 28365470
Implantable Pulse Generator Implant Date: 20190211
Lead Channel Impedance Value: 572 Ohm
Lead Channel Pacing Threshold Amplitude: 0.8 V
Lead Channel Pacing Threshold Pulse Width: 0.4 ms
Lead Channel Setting Pacing Amplitude: 1.1 V
Lead Channel Setting Pacing Pulse Width: 0.4 ms
Lead Channel Setting Sensing Sensitivity: 2.5 mV
Pulse Gen Serial Number: 396317
Zone Setting Status: 755011

## 2023-11-25 LAB — URINE CULTURE

## 2023-11-25 MED ORDER — AMOXICILLIN 500 MG PO CAPS: 500.0000 mg | ORAL_CAPSULE | Freq: Two times a day (BID) | ORAL | 0 refills | Status: AC

## 2023-11-25 NOTE — Telephone Encounter (Signed)
 [facility] called and stated that the patient GFR is out of range for CT with CM and the can not do CT with contrast due to her GFR.  Per Jarold Merlin, PA-C: Can do CT without contrast

## 2023-11-26 NOTE — Telephone Encounter (Signed)
 Copied from CRM [id number]. Topic: Referral - Question >> Nov 26, 2023 11:40 AM Tracy Vasquez wrote: Reason for CRM:   Pt called and wanted to know if she could see or be referred to a female obgyn. It is the patients preference.

## 2023-11-28 ENCOUNTER — Ambulatory Visit: Payer: Self-pay | Admitting: Internal Medicine

## 2023-11-28 NOTE — Telephone Encounter (Signed)
 Done. Dr. Sedalia Muta

## 2023-12-02 ENCOUNTER — Other Ambulatory Visit: Payer: Self-pay | Admitting: Family Medicine

## 2023-12-02 ENCOUNTER — Telehealth: Payer: Self-pay

## 2023-12-02 DIAGNOSIS — F5101 Primary insomnia: Secondary | ICD-10-CM

## 2023-12-02 NOTE — Telephone Encounter (Signed)
 Patient came into the office stating she "would like to do the papers for things you suggested last time". I questioned if it was possibly advanced directives? She said she has a healthcare POA but can not find the papers and knows she needs "new papers", a DNR, along with some other forms that were suggested. (MOST form?) I told her I would speak with you to find out what forms she needs, then myself or possibly Burdette Carolin would call her tomorrow or Monday.

## 2023-12-06 ENCOUNTER — Other Ambulatory Visit: Payer: Self-pay

## 2023-12-09 ENCOUNTER — Ambulatory Visit: Payer: Self-pay | Admitting: Obstetrics and Gynecology

## 2023-12-10 ENCOUNTER — Ambulatory Visit (HOSPITAL_BASED_OUTPATIENT_CLINIC_OR_DEPARTMENT_OTHER)
Admission: RE | Admit: 2023-12-10 | Discharge: 2023-12-10 | Disposition: A | Discharge status: Home / Self Care | Source: Ambulatory Visit | Attending: Family Medicine | Admitting: Family Medicine

## 2023-12-10 DIAGNOSIS — R3989 Other symptoms and signs involving the genitourinary system: Secondary | ICD-10-CM

## 2023-12-10 DIAGNOSIS — N813 Complete uterovaginal prolapse: Secondary | ICD-10-CM

## 2023-12-10 DIAGNOSIS — N3001 Acute cystitis with hematuria: Secondary | ICD-10-CM

## 2023-12-10 DIAGNOSIS — N321 Vesicointestinal fistula: Secondary | ICD-10-CM | POA: Diagnosis not present

## 2023-12-10 DIAGNOSIS — R195 Other fecal abnormalities: Secondary | ICD-10-CM

## 2023-12-22 ENCOUNTER — Telehealth: Payer: Self-pay

## 2023-12-22 ENCOUNTER — Other Ambulatory Visit: Payer: Self-pay

## 2023-12-22 DIAGNOSIS — N184 Chronic kidney disease, stage 4 (severe): Secondary | ICD-10-CM

## 2023-12-22 NOTE — Telephone Encounter (Signed)
 Called patient and let her know that we have the labs order. She will come tomorrow. Order are in.   Copied from CRM [id number]. Topic: Clinical - Request for Lab/Test Order >> Dec 22, 2023  1:46 PM Hamp Levine R wrote: Reason for CRM: Patient states Dr Yvonnie Heritage from [facility] was faxing over orders for her to have labs done at the office. Wants to confirm they have been received.  Patient can be reached at [phone]

## 2023-12-23 ENCOUNTER — Other Ambulatory Visit

## 2023-12-23 ENCOUNTER — Other Ambulatory Visit: Payer: Self-pay | Admitting: Family Medicine

## 2023-12-23 DIAGNOSIS — N184 Chronic kidney disease, stage 4 (severe): Secondary | ICD-10-CM

## 2023-12-23 DIAGNOSIS — N813 Complete uterovaginal prolapse: Secondary | ICD-10-CM

## 2023-12-24 LAB — RENAL FUNCTION PANEL
Albumin: 4.4 g/dL (ref 3.6–4.6)
BUN/Creatinine Ratio: 24 (ref 12–28)
BUN: 41 mg/dL — ABNORMAL HIGH (ref 10–36)
CO2: 24 mmol/L (ref 20–29)
Calcium: 9.8 mg/dL (ref 8.7–10.3)
Chloride: 95 mmol/L — ABNORMAL LOW (ref 96–106)
Creatinine, Ser: 1.72 mg/dL — ABNORMAL HIGH (ref 0.57–1.00)
Glucose: 82 mg/dL (ref 70–99)
Phosphorus: 3.7 mg/dL (ref 3.0–4.3)
Potassium: 4.8 mmol/L (ref 3.5–5.2)
Sodium: 137 mmol/L (ref 134–144)
eGFR: 28 mL/min/{1.73_m2} — ABNORMAL LOW (ref >59)

## 2023-12-24 LAB — CBC WITH DIFFERENTIAL/PLATELET
Basophils Absolute: 0.1 10*3/uL (ref 0.0–0.2)
Basos: 1 %
EOS (ABSOLUTE): 0.3 10*3/uL (ref 0.0–0.4)
Eos: 3 %
Hematocrit: 41 % (ref 34.0–46.6)
Hemoglobin: 12.9 g/dL (ref 11.1–15.9)
Immature Grans (Abs): 0 10*3/uL (ref 0.0–0.1)
Immature Granulocytes: 0 %
Lymphocytes Absolute: 2.3 10*3/uL (ref 0.7–3.1)
Lymphs: 28 %
MCH: 30.2 pg (ref 26.6–33.0)
MCHC: 31.5 g/dL (ref 31.5–35.7)
MCV: 96 fL (ref 79–97)
Monocytes Absolute: 1 10*3/uL — ABNORMAL HIGH (ref 0.1–0.9)
Monocytes: 12 %
Neutrophils Absolute: 4.5 10*3/uL (ref 1.4–7.0)
Neutrophils: 56 %
Platelets: 230 10*3/uL (ref 150–450)
RBC: 4.27 x10E6/uL (ref 3.77–5.28)
RDW: 13.1 % (ref 11.7–15.4)
WBC: 8.1 10*3/uL (ref 3.4–10.8)

## 2023-12-24 LAB — PARATHYROID HORMONE, INTACT (NO CA): PTH: 113 pg/mL — ABNORMAL HIGH (ref 15–65)

## 2023-12-24 LAB — PROT/CREAT RATIO, SERUM
Prot/Creat Ratio, S: 4.53
Total Protein: 7.8 g/dL (ref 6.0–8.5)

## 2023-12-24 LAB — MAGNESIUM: Magnesium: 2.3 mg/dL (ref 1.6–2.3)

## 2023-12-24 LAB — VITAMIN D 25 HYDROXY (VIT D DEFICIENCY, FRACTURES): Vit D, 25-Hydroxy: 62.9 ng/mL (ref 30.0–100.0)

## 2023-12-26 ENCOUNTER — Ambulatory Visit: Payer: Self-pay | Admitting: Family Medicine

## 2023-12-30 NOTE — Telephone Encounter (Signed)
 Labs were faxed to [facility] [phone]. Called to confirm and they received them.

## 2024-01-12 ENCOUNTER — Ambulatory Visit: Payer: Self-pay | Admitting: Obstetrics and Gynecology

## 2024-01-12 NOTE — Progress Notes (Signed)
 Remote pacemaker transmission.

## 2024-02-16 ENCOUNTER — Other Ambulatory Visit

## 2024-02-16 DIAGNOSIS — N184 Chronic kidney disease, stage 4 (severe): Secondary | ICD-10-CM

## 2024-02-17 LAB — PROT/CREAT RATIO, SERUM
Prot/Creat Ratio, S: 4.29
Total Protein: 7.8 g/dL (ref 6.0–8.5)

## 2024-02-17 LAB — RENAL FUNCTION PANEL
Albumin: 4.5 g/dL (ref 3.6–4.6)
BUN/Creatinine Ratio: 24 (ref 12–28)
BUN: 44 mg/dL — ABNORMAL HIGH (ref 10–36)
CO2: 22 mmol/L (ref 20–29)
Calcium: 9.8 mg/dL (ref 8.7–10.3)
Chloride: 97 mmol/L (ref 96–106)
Creatinine, Ser: 1.82 mg/dL — ABNORMAL HIGH (ref 0.57–1.00)
Glucose: 143 mg/dL — ABNORMAL HIGH (ref 70–99)
Phosphorus: 4.1 mg/dL (ref 3.0–4.3)
Potassium: 5.3 mmol/L — ABNORMAL HIGH (ref 3.5–5.2)
Sodium: 141 mmol/L (ref 134–144)
eGFR: 26 mL/min/1.73 — ABNORMAL LOW (ref >59)

## 2024-02-17 LAB — CBC WITH DIFFERENTIAL/PLATELET
Basophils Absolute: 0.1 x10E3/uL (ref 0.0–0.2)
Basos: 1 %
EOS (ABSOLUTE): 0.3 x10E3/uL (ref 0.0–0.4)
Eos: 3 %
Hematocrit: 41.3 % (ref 34.0–46.6)
Hemoglobin: 13.1 g/dL (ref 11.1–15.9)
Immature Grans (Abs): 0 x10E3/uL (ref 0.0–0.1)
Immature Granulocytes: 0 %
Lymphocytes Absolute: 2.4 x10E3/uL (ref 0.7–3.1)
Lymphs: 32 %
MCH: 31 pg (ref 26.6–33.0)
MCHC: 31.7 g/dL (ref 31.5–35.7)
MCV: 98 fL — ABNORMAL HIGH (ref 79–97)
Monocytes Absolute: 0.8 x10E3/uL (ref 0.1–0.9)
Monocytes: 10 %
Neutrophils Absolute: 4.2 x10E3/uL (ref 1.4–7.0)
Neutrophils: 54 %
Platelets: 225 x10E3/uL (ref 150–450)
RBC: 4.23 x10E6/uL (ref 3.77–5.28)
RDW: 12.7 % (ref 11.7–15.4)
WBC: 7.7 x10E3/uL (ref 3.4–10.8)

## 2024-02-17 LAB — MAGNESIUM: Magnesium: 2.4 mg/dL — ABNORMAL HIGH (ref 1.6–2.3)

## 2024-02-17 LAB — VITAMIN D 25 HYDROXY (VIT D DEFICIENCY, FRACTURES): Vit D, 25-Hydroxy: 59.1 ng/mL (ref 30.0–100.0)

## 2024-02-17 LAB — PARATHYROID HORMONE, INTACT (NO CA): PTH: 76 pg/mL — ABNORMAL HIGH (ref 15–65)

## 2024-02-18 ENCOUNTER — Ambulatory Visit: Payer: Self-pay | Admitting: Family Medicine

## 2024-02-19 ENCOUNTER — Other Ambulatory Visit: Payer: Self-pay | Admitting: Internal Medicine

## 2024-02-19 DIAGNOSIS — I482 Chronic atrial fibrillation, unspecified: Secondary | ICD-10-CM

## 2024-02-21 NOTE — Telephone Encounter (Signed)
 Prescription refill request for Xarelto  received.  Indication:afib Last office visit:9/24 Weight:56.7  kg Age:89 Scr:1.68  3/25 CrCl:19.13  ml/min  Prescription refilled

## 2024-02-22 ENCOUNTER — Telehealth: Payer: Self-pay | Admitting: Internal Medicine

## 2024-02-22 NOTE — Telephone Encounter (Signed)
 Patient c/o Palpitations:  STAT if patient reporting lightheadedness, shortness of breath, or chest pain  How long have you had palpitations/irregular HR/ Afib? Are you having the symptoms now?  Yes - patient noted she has this all the time  Are you currently experiencing lightheadedness, SOB or CP?   No  Do you have a history of afib (atrial fibrillation) or irregular heart rhythm?   Yes  Have you checked your BP or HR? (document readings if available):   No  Are you experiencing any other symptoms?   Tiredness    Patient stated episode started a couple of weeks ago and noted last Thursday she had a scratch near her ankle and had to go to ER as blood was shooting out.  Patient stated she has not been able to sleep for the last 2 nights.  Patient is concerned she has been feeling very fatigued, her heart has been pounding and she had dark stool.

## 2024-02-22 NOTE — Telephone Encounter (Signed)
 Spoke with pt regarding her symptoms. Pt stated she has been more tired than usual. Pt stated she has not slept in the last couple days because she felt her heart racing. Pt has no way to take her pulse or blood pressure. Pt stated she went to the ED last week for an arterial bleed in her ankle and her blood pressure there was roughly 130/70. Pt stated she also passed a dark stool yesterday 8/18 but stated she did not see any blood. Pt stated she thinks it was something she ate. Pt was told that if she begins to have shortness of breath, chest pain or blood in her stool that she should go the the Emergency Room. Pt was also told to follow up with her primary care provider regarding fatigue. Pt has appointment with Dr. Waddell on 9/12. Pt verbalized understanding. All questions if any were answered.

## 2024-02-24 ENCOUNTER — Ambulatory Visit (INDEPENDENT_AMBULATORY_CARE_PROVIDER_SITE_OTHER)

## 2024-02-24 DIAGNOSIS — I495 Sick sinus syndrome: Secondary | ICD-10-CM | POA: Diagnosis not present

## 2024-02-24 LAB — CUP PACEART REMOTE DEVICE CHECK
Battery Remaining Longevity: 18 mo
Battery Remaining Percentage: 26 %
Brady Statistic RA Percent Paced: 0 %
Brady Statistic RV Percent Paced: 27 %
Date Time Interrogation Session: 20250821030200
Implantable Lead Connection Status: 753985
Implantable Lead Connection Status: 753985
Implantable Lead Implant Date: 20080827
Implantable Lead Implant Date: 20080827
Implantable Lead Location: 753859
Implantable Lead Location: 753860
Implantable Lead Model: 4135
Implantable Lead Model: 4136
Implantable Lead Serial Number: 28356762
Implantable Lead Serial Number: 28365470
Implantable Pulse Generator Implant Date: 20190211
Lead Channel Impedance Value: 532 Ohm
Lead Channel Pacing Threshold Amplitude: 0.9 V
Lead Channel Pacing Threshold Pulse Width: 0.4 ms
Lead Channel Setting Pacing Amplitude: 1.4 V
Lead Channel Setting Pacing Pulse Width: 0.4 ms
Lead Channel Setting Sensing Sensitivity: 2.5 mV
Pulse Gen Serial Number: 396317
Zone Setting Status: 755011

## 2024-02-25 ENCOUNTER — Other Ambulatory Visit: Payer: Self-pay

## 2024-02-25 DIAGNOSIS — F5101 Primary insomnia: Secondary | ICD-10-CM

## 2024-02-25 MED ORDER — LORAZEPAM 0.5 MG PO TABS: 0.5000 mg | ORAL_TABLET | Freq: Every day | ORAL | 2 refills | Status: DC

## 2024-02-27 ENCOUNTER — Ambulatory Visit: Payer: Self-pay | Admitting: Internal Medicine

## 2024-03-10 ENCOUNTER — Telehealth: Payer: Self-pay

## 2024-03-10 NOTE — Telephone Encounter (Signed)
 Outreach made to pt.  Confirmed date/time for upcoming appointment with Dr. Waddell.

## 2024-03-12 ENCOUNTER — Other Ambulatory Visit: Payer: Self-pay | Admitting: Internal Medicine

## 2024-03-17 ENCOUNTER — Ambulatory Visit: Attending: Internal Medicine | Admitting: Internal Medicine

## 2024-03-17 ENCOUNTER — Encounter: Payer: Self-pay | Admitting: Internal Medicine

## 2024-03-17 VITALS — BP 106/60 | HR 83 | Ht 63.0 in | Wt 127.3 lb

## 2024-03-17 DIAGNOSIS — I495 Sick sinus syndrome: Secondary | ICD-10-CM | POA: Diagnosis not present

## 2024-03-17 DIAGNOSIS — I482 Chronic atrial fibrillation, unspecified: Secondary | ICD-10-CM

## 2024-03-17 LAB — CUP PACEART INCLINIC DEVICE CHECK
Date Time Interrogation Session: 20250912141412
Implantable Lead Connection Status: 753985
Implantable Lead Connection Status: 753985
Implantable Lead Implant Date: 20080827
Implantable Lead Implant Date: 20080827
Implantable Lead Location: 753859
Implantable Lead Location: 753860
Implantable Lead Model: 4135
Implantable Lead Model: 4136
Implantable Lead Serial Number: 28356762
Implantable Lead Serial Number: 28365470
Implantable Pulse Generator Implant Date: 20190211
Pulse Gen Serial Number: 396317

## 2024-03-17 NOTE — Progress Notes (Signed)
 HPI Tracy Vasquez returns today for followup. She is a pleasant 89 yo woman with a h/o HTN, persistent atrial fib, and CHB, s/p PPM insertion. She denies chest pain, sob, peripheral edema, and has not had syncope. She remains active. she had been on low dose amiodarone  but was stopped as she had gone back into atrial fib and did not know it. She denies chest pain or sob.  No Known Allergies   Current Outpatient Medications  Medication Sig Dispense Refill   acetaminophen  (TYLENOL ) 500 MG tablet Take 1,000 mg by mouth every 6 (six) hours as needed for mild pain.     digoxin  (LANOXIN ) 0.125 MG tablet TAKE 1/2 TABLET BY MOUTH EVERY OTHER DAY 45 tablet 3   estradiol  (ESTRACE ) 0.1 MG/GM vaginal cream Place 1 Applicatorful vaginally every other day. 42.5 g 3   febuxostat  (ULORIC ) 40 MG tablet TAKE 1/2 TABLET BY MOUTH DAILY 45 tablet 1   furosemide  (LASIX ) 20 MG tablet 2 in am and 3 in pm. 1 tablet 0   LORazepam  (ATIVAN ) 0.5 MG tablet Take 1 tablet (0.5 mg total) by mouth at bedtime. 30 tablet 2   metoprolol  tartrate (LOPRESSOR ) 100 MG tablet Take 1 tablet (100 mg total) by mouth 2 (two) times daily. 180 tablet 3   pravastatin  (PRAVACHOL ) 20 MG tablet TAKE 1 TABLET(20 MG) BY MOUTH DAILY 90 tablet 1   XARELTO  15 MG TABS tablet TAKE ONE TABLET BY MOUTH DAILY with supper 90 tablet 1   dapagliflozin  propanediol (FARXIGA ) 5 MG TABS tablet TAKE ONE TABLET BY MOUTH DAILY BEFORE BREAKFAST (Patient not taking: Reported on 03/17/2024) 30 tablet 1   nitrofurantoin , macrocrystal-monohydrate, (MACROBID ) 100 MG capsule Take 1 capsule (100 mg total) by mouth daily. (Patient not taking: Reported on 03/17/2024) 30 capsule 2   No current facility-administered medications for this visit.     Past Medical History:  Diagnosis Date   Acute gangrenous appendicitis with perforation and peritonitis 11/22/2011   Afib (HCC) 2008   CVA (cerebral infarction) 2008   no residual deficits   Osteoporosis     Tachycardia-bradycardia syndrome (HCC) 01/28/2021    ROS:   All systems reviewed and negative except as noted in the HPI.   Past Surgical History:  Procedure Laterality Date   INSERT / REPLACE / REMOVE PACEMAKER     LAPAROSCOPIC APPENDECTOMY  11/22/2011   Procedure: APPENDECTOMY LAPAROSCOPIC;  Surgeon: Jina Nephew, MD;  Location: [facility] OR;  Service: General;  Laterality: N/A;   PACEMAKER INSERTION     PPM GENERATOR CHANGEOUT N/A 08/16/2017   Procedure: PPM GENERATOR CHANGEOUT;  Surgeon: Waddell Danelle ORN, MD;  Location: [facility] INVASIVE CV LAB;  Service: Cardiovascular;  Laterality: N/A;   RIGHT/LEFT HEART CATH AND CORONARY ANGIOGRAPHY N/A 02/01/2017   Procedure: Right/Left Heart Cath and Coronary Angiography;  Surgeon: Burnard Debby LABOR, MD;  Location: [facility] INVASIVE CV LAB;  Service: Cardiovascular;  Laterality: N/A;   TONSILLECTOMY AND ADENOIDECTOMY  1935     Family History  Problem Relation Age of Onset   Coronary artery disease Mother    Heart attack Mother    Atrial fibrillation Father    Lung cancer Father    Alcohol abuse Brother    Failure to thrive Brother      Social History   Socioeconomic History   Marital status: Widowed    Spouse name: Not on file   Number of children: 3   Years of education: Not on file   Highest education  level: Not on file  Occupational History   Occupation: Retired  Tobacco Use   Smoking status: Former    Current packs/day: 0.00    Types: Cigarettes    Quit date: 1975    Years since quitting: 50.7   Smokeless tobacco: Never  Vaping Use   Vaping status: Never Used  Substance and Sexual Activity   Alcohol use: No   Drug use: No   Sexual activity: Not Currently  Other Topics Concern   Not on file  Social History Narrative   Lives with dtr, moved to [place] from [place] in 2014, widowed early 2013 (60 years married)   Social Drivers of Corporate investment banker Strain: Low Risk  (09/28/2023)   Overall Financial Resource Strain  (CARDIA)    Difficulty of Paying Living Expenses: Not hard at all  Food Insecurity: No Food Insecurity (09/28/2023)   Hunger Vital Sign    Worried About Running Out of Food in the Last Year: Never true    Ran Out of Food in the Last Year: Never true  Transportation Needs: No Transportation Needs (11/23/2023)   PRAPARE - Administrator, Civil Service (Medical): No    Lack of Transportation (Non-Medical): No  Physical Activity: Sufficiently Active (11/23/2023)   Exercise Vital Sign    Days of Exercise per Week: 3 days    Minutes of Exercise per Session: 60 min  Stress: No Stress Concern Present (09/28/2023)   [employer] of Occupational Health - Occupational Stress Questionnaire    Feeling of Stress : Not at all  Social Connections: Socially Isolated (09/03/2022)   Social Connection and Isolation Panel    Frequency of Communication with Friends and Family: More than three times a week    Frequency of Social Gatherings with Friends and Family: More than three times a week    Attends Religious Services: Never    Database administrator or Organizations: No    Attends Banker Meetings: Never    Marital Status: Widowed  Intimate Partner Violence: Not At Risk (11/23/2023)   Humiliation, Afraid, Rape, and Kick questionnaire    Fear of Current or Ex-Partner: No    Emotionally Abused: No    Physically Abused: No    Sexually Abused: No     BP 106/60   Pulse 83   Ht 5' 3 (1.6 m)   Wt 127 lb 4.8 oz (57.7 kg)   SpO2 99%   BMI 22.55 kg/m   Physical Exam:  Well appearing NAD HEENT: Unremarkable Neck:  No JVD, no thyromegally Lymphatics:  No adenopathy Back:  No CVA tenderness Lungs:  Clear HEART:  Regular rate rhythm, no murmurs, no rubs, no clicks Abd:  soft, positive bowel sounds, no organomegally, no rebound, no guarding Ext:  2 plus pulses, no edema, no cyanosis, no clubbing Skin:  No rashes no nodules Neuro:  CN II through XII intact, motor grossly  intact  EKG  DEVICE  Normal device function.  See PaceArt for details.   Assess/Plan:  Atrial fib - her VR is controlled. She will continue our current meds. She has been off of amiodarone . HTN - her bp is elevated a bit. We will ask her to avoid salty foods. No change in her meds now. PPM - her [employer] DDD PM is working normally.  HTN - her sbp is ok today. She is encouraged to reduce her salty foods.    Danelle TaylorMD

## 2024-03-17 NOTE — Patient Instructions (Addendum)
 Medication Instructions:  Your physician recommends that you continue on your current medications as directed. Please refer to the Current Medication list given to you today.  *If you need a refill on your cardiac medications before your next appointment, please call your pharmacy*  Lab Work: None ordered.  You may go to any [facility] Location for your lab work:  [facility] - [address] ([facility]) - [address] [phone] [address]  [place] - [address]   [place]  - [address]   [place] - [address] - [address] [place]  - [address] ([facility]   If you have labs (blood work) drawn today and your tests are completely normal, you will receive your results only by: [employer] (if you have MyChart)  If you have any lab test that is abnormal or we need to change your treatment, we will call you or send a MyChart message to review the results.  Testing/Procedures: None ordered.  Follow-Up: At [facility], you and your health needs are our priority.  As part of our continuing mission to provide you with exceptional heart care, we have created designated Provider Care Teams.  These Care Teams include your primary Cardiologist (physician) and Advanced Practice Providers (APPs -  Physician Assistants and Nurse Practitioners) who all work together to provide you with the care you need, when you need it.  We recommend signing up for the patient portal called MyChart.  Sign up information is provided on this After Visit Summary.  MyChart is used to connect with patients for Virtual Visits (Telemedicine).  Patients are able to view lab/test results, encounter notes, upcoming appointments, etc.  Non-urgent messages can be sent to your provider as well.   To learn more about what you can do with MyChart, go to  [email or web address].    Your next appointment:   1 year(s) in [place]   The format for your next appointment:   In Person  Provider:   Soyla Norton, MD or one of the following Advanced Practice Providers on your designated Care Team:   Charlies Arthur, [place] Ozell Jodie Passey, [place] Leotis Barrack, NP  Note: Remote monitoring is used to monitor your Pacemaker/ ICD from home. This monitoring reduces the number of office visits required to check your device to one time per year. It allows us  to keep an eye on the functioning of your device to ensure it is working properly.

## 2024-03-29 NOTE — Progress Notes (Signed)
 Remote PPM Transmission

## 2024-05-07 ENCOUNTER — Emergency Department (HOSPITAL_COMMUNITY)

## 2024-05-07 ENCOUNTER — Other Ambulatory Visit: Payer: Self-pay

## 2024-05-07 ENCOUNTER — Encounter (HOSPITAL_COMMUNITY): Payer: Self-pay

## 2024-05-07 ENCOUNTER — Inpatient Hospital Stay (HOSPITAL_COMMUNITY)
Admission: EM | Admit: 2024-05-07 | Discharge: 2024-05-09 | DRG: 313 | Disposition: A | Discharge status: Home / Self Care | Source: Skilled Nursing Facility | Attending: Internal Medicine | Admitting: Internal Medicine

## 2024-05-07 DIAGNOSIS — Z8673 Personal history of transient ischemic attack (TIA), and cerebral infarction without residual deficits: Secondary | ICD-10-CM

## 2024-05-07 DIAGNOSIS — D72829 Elevated white blood cell count, unspecified: Secondary | ICD-10-CM | POA: Insufficient documentation

## 2024-05-07 DIAGNOSIS — N390 Urinary tract infection, site not specified: Secondary | ICD-10-CM | POA: Diagnosis present

## 2024-05-07 DIAGNOSIS — I428 Other cardiomyopathies: Secondary | ICD-10-CM | POA: Diagnosis present

## 2024-05-07 DIAGNOSIS — I442 Atrioventricular block, complete: Secondary | ICD-10-CM | POA: Diagnosis present

## 2024-05-07 DIAGNOSIS — I495 Sick sinus syndrome: Secondary | ICD-10-CM | POA: Diagnosis present

## 2024-05-07 DIAGNOSIS — Z8739 Personal history of other diseases of the musculoskeletal system and connective tissue: Secondary | ICD-10-CM

## 2024-05-07 DIAGNOSIS — M810 Age-related osteoporosis without current pathological fracture: Secondary | ICD-10-CM | POA: Diagnosis present

## 2024-05-07 DIAGNOSIS — N184 Chronic kidney disease, stage 4 (severe): Secondary | ICD-10-CM | POA: Diagnosis present

## 2024-05-07 DIAGNOSIS — I130 Hypertensive heart and chronic kidney disease with heart failure and stage 1 through stage 4 chronic kidney disease, or unspecified chronic kidney disease: Secondary | ICD-10-CM | POA: Diagnosis present

## 2024-05-07 DIAGNOSIS — I4821 Permanent atrial fibrillation: Secondary | ICD-10-CM | POA: Diagnosis present

## 2024-05-07 DIAGNOSIS — Z8249 Family history of ischemic heart disease and other diseases of the circulatory system: Secondary | ICD-10-CM

## 2024-05-07 DIAGNOSIS — I452 Bifascicular block: Secondary | ICD-10-CM | POA: Diagnosis present

## 2024-05-07 DIAGNOSIS — R0789 Other chest pain: Secondary | ICD-10-CM | POA: Diagnosis not present

## 2024-05-07 DIAGNOSIS — Z66 Do not resuscitate: Secondary | ICD-10-CM | POA: Diagnosis present

## 2024-05-07 DIAGNOSIS — I5022 Chronic systolic (congestive) heart failure: Secondary | ICD-10-CM | POA: Diagnosis present

## 2024-05-07 DIAGNOSIS — Z950 Presence of cardiac pacemaker: Secondary | ICD-10-CM

## 2024-05-07 DIAGNOSIS — I071 Rheumatic tricuspid insufficiency: Secondary | ICD-10-CM | POA: Diagnosis present

## 2024-05-07 DIAGNOSIS — Z87891 Personal history of nicotine dependence: Secondary | ICD-10-CM

## 2024-05-07 DIAGNOSIS — Z7901 Long term (current) use of anticoagulants: Secondary | ICD-10-CM

## 2024-05-07 DIAGNOSIS — E782 Mixed hyperlipidemia: Secondary | ICD-10-CM | POA: Diagnosis present

## 2024-05-07 DIAGNOSIS — Z604 Social exclusion and rejection: Secondary | ICD-10-CM | POA: Diagnosis present

## 2024-05-07 DIAGNOSIS — R079 Chest pain, unspecified: Principal | ICD-10-CM

## 2024-05-07 DIAGNOSIS — Z79899 Other long term (current) drug therapy: Secondary | ICD-10-CM

## 2024-05-07 DIAGNOSIS — I482 Chronic atrial fibrillation, unspecified: Secondary | ICD-10-CM | POA: Diagnosis present

## 2024-05-07 DIAGNOSIS — Z811 Family history of alcohol abuse and dependence: Secondary | ICD-10-CM

## 2024-05-07 DIAGNOSIS — M109 Gout, unspecified: Secondary | ICD-10-CM | POA: Diagnosis present

## 2024-05-07 DIAGNOSIS — Z801 Family history of malignant neoplasm of trachea, bronchus and lung: Secondary | ICD-10-CM

## 2024-05-07 LAB — CBC
HCT: 38.8 % (ref 36.0–46.0)
Hemoglobin: 12.2 g/dL (ref 12.0–15.0)
MCH: 29.2 pg (ref 26.0–34.0)
MCHC: 31.4 g/dL (ref 30.0–36.0)
MCV: 92.8 fL (ref 80.0–100.0)
Platelets: 242 K/uL (ref 150–400)
RBC: 4.18 MIL/uL (ref 3.87–5.11)
RDW: 13.7 % (ref 11.5–15.5)
WBC: 18.4 K/uL — ABNORMAL HIGH (ref 4.0–10.5)
nRBC: 0 % (ref 0.0–0.2)

## 2024-05-07 LAB — BASIC METABOLIC PANEL WITH GFR
Anion gap: 12 (ref 5–15)
BUN: 37 mg/dL — ABNORMAL HIGH (ref 8–23)
CO2: 27 mmol/L (ref 22–32)
Calcium: 9.1 mg/dL (ref 8.9–10.3)
Chloride: 96 mmol/L — ABNORMAL LOW (ref 98–111)
Creatinine, Ser: 1.62 mg/dL — ABNORMAL HIGH (ref 0.44–1.00)
GFR, Estimated: 30 mL/min — ABNORMAL LOW (ref >60)
Glucose, Bld: 114 mg/dL — ABNORMAL HIGH (ref 70–99)
Potassium: 4.7 mmol/L (ref 3.5–5.1)
Sodium: 135 mmol/L (ref 135–145)

## 2024-05-07 LAB — I-STAT CHEM 8, ED
BUN: 39 mg/dL — ABNORMAL HIGH (ref 8–23)
Calcium, Ion: 1.11 mmol/L — ABNORMAL LOW (ref 1.15–1.40)
Chloride: 99 mmol/L (ref 98–111)
Creatinine, Ser: 1.7 mg/dL — ABNORMAL HIGH (ref 0.44–1.00)
Glucose, Bld: 117 mg/dL — ABNORMAL HIGH (ref 70–99)
HCT: 38 % (ref 36.0–46.0)
Hemoglobin: 12.9 g/dL (ref 12.0–15.0)
Potassium: 4.5 mmol/L (ref 3.5–5.1)
Sodium: 136 mmol/L (ref 135–145)
TCO2: 27 mmol/L (ref 22–32)

## 2024-05-07 LAB — PROTIME-INR
INR: 1.4 — ABNORMAL HIGH (ref 0.8–1.2)
Prothrombin Time: 17.9 s — ABNORMAL HIGH (ref 11.4–15.2)

## 2024-05-07 LAB — TROPONIN I (HIGH SENSITIVITY): Troponin I (High Sensitivity): 11 ng/L (ref <18)

## 2024-05-07 MED ORDER — SODIUM CHLORIDE 0.9% FLUSH: 3.0000 mL | Freq: Once | INTRAVENOUS | Status: DC

## 2024-05-07 NOTE — ED Triage Notes (Signed)
 PT brought in by [facility], PT is from [facility], PT brought in for chest pain that started at 5pm PT states she has a pacemaker and hasn't had chest pain like this before, PT was given 324 of ASA. PT VSS and no needs noted states chest pain is pretty much gone.

## 2024-05-07 NOTE — ED Provider Notes (Signed)
 [facility] Emergency Department Provider Note MRN:  [id number]  Arrival date & time: 05/08/24     Chief Complaint   Chest Pain   History of Present Illness   Tracy Vasquez is a 89 y.o. year-old female with a history of stroke, pacemaker presenting to the ED with chief complaint of chest pain.  Patient felt generally unwell this afternoon and at dinnertime, did not have appetite, went straight to bed.  Started having some pretty severe chest pain after that.  Denies dizziness or diaphoresis, no nausea or vomiting, no trouble breathing, no recent leg pain or swelling.  Has never had pain like this before.  Review of Systems  A thorough review of systems was obtained and all systems are negative except as noted in the HPI and PMH.   Patient's Health History    Past Medical History:  Diagnosis Date   Acute gangrenous appendicitis with perforation and peritonitis 11/22/2011   Afib (HCC) 2008   CVA (cerebral infarction) 2008   no residual deficits   Osteoporosis    Tachycardia-bradycardia syndrome (HCC) 01/28/2021    Past Surgical History:  Procedure Laterality Date   INSERT / REPLACE / REMOVE PACEMAKER     LAPAROSCOPIC APPENDECTOMY  11/22/2011   Procedure: APPENDECTOMY LAPAROSCOPIC;  Surgeon: Jina Nephew, MD;  Location[facility] OR;  Service: General;  Laterality: N/A;   PACEMAKER INSERTION     PPM GENERATOR CHANGEOUT N/A 08/16/2017   Procedure: PPM GENERATOR CHANGEOUT;  Surgeon: Waddell Danelle ORN, MD;  [facility] INVASIVE CV LAB;  Service: Cardiovascular;  Laterality: N/A;   RIGHT/LEFT HEART CATH AND CORONARY ANGIOGRAPHY N/A 02/01/2017   Procedure: Right/Left Heart Cath and Coronary Angiography;  Surgeon: Burnard Debby LABOR, MD;  Location[facility] INVASIVE CV LAB;  Service: Cardiovascular;  Laterality: N/A;   TONSILLECTOMY AND ADENOIDECTOMY  1935    Family History  Problem Relation Age of Onset   Coronary artery disease Mother    Heart attack Mother    Atrial  fibrillation Father    Lung cancer Father    Alcohol abuse Brother    Failure to thrive Brother     Social History   Socioeconomic History   Marital status: Widowed    Spouse name: Not on file   Number of children: 3   Years of education: Not on file   Highest education level: Not on file  Occupational History   Occupation: Retired  Tobacco Use   Smoking status: Former    Current packs/day: 0.00    Types: Cigarettes    Quit date: 1975    Years since quitting: 50.8   Smokeless tobacco: Never  Vaping Use   Vaping status: Never Used  Substance and Sexual Activity   Alcohol use: No   Drug use: No   Sexual activity: Not Currently  Other Topics Concern   Not on file  Social History Narrative   Lives with dtr, moved to [place] from [place] in 2014, widowed early 2013 (60 years married)   Social Drivers of Corporate Investment Banker Strain: Low Risk  (09/28/2023)   Overall Financial Resource Strain (CARDIA)    Difficulty of Paying Living Expenses: Not hard at all  Food Insecurity: No Food Insecurity (09/28/2023)   Hunger Vital Sign    Worried About Running Out of Food in the Last Year: Never true    Ran Out of Food in the Last Year: Never true  Transportation Needs: No Transportation Needs (11/23/2023)   PRAPARE -  Administrator, Civil Service (Medical): No    Lack of Transportation (Non-Medical): No  Physical Activity: Sufficiently Active (11/23/2023)   Exercise Vital Sign    Days of Exercise per Week: 3 days    Minutes of Exercise per Session: 60 min  Stress: No Stress Concern Present (09/28/2023)   [employer] of Occupational Health - Occupational Stress Questionnaire    Feeling of Stress : Not at all  Social Connections: Socially Isolated (09/03/2022)   Social Connection and Isolation Panel    Frequency of Communication with Friends and Family: More than three times a week    Frequency of Social Gatherings with Friends and Family: More than three  times a week    Attends Religious Services: Never    Database Administrator or Organizations: No    Attends Banker Meetings: Never    Marital Status: Widowed  Intimate Partner Violence: Not At Risk (11/23/2023)   Humiliation, Afraid, Rape, and Kick questionnaire    Fear of Current or Ex-Partner: No    Emotionally Abused: No    Physically Abused: No    Sexually Abused: No     Physical Exam   Vitals:   05/08/24 0245 05/08/24 0300  BP: 119/70   Pulse: (!) 101 (!) 135  Resp: 19 (!) 26  Temp:    SpO2: 95% 94%    CONSTITUTIONAL: Well-appearing, NAD NEURO/PSYCH:  Alert and oriented x 3, no focal deficits EYES:  eyes equal and reactive ENT/NECK:  no LAD, no JVD CARDIO: Regular rate, well-perfused, normal S1 and S2 PULM:  CTAB no wheezing or rhonchi GI/GU:  non-distended, non-tender MSK/SPINE:  No gross deformities, no edema SKIN:  no rash, atraumatic   *Additional and/or pertinent findings included in MDM below  Diagnostic and Interventional Summary    EKG Interpretation Date/Time:  Sunday May 07 2024 22:40:24 EST Ventricular Rate:  109 PR Interval:    QRS Duration:  127 QT Interval:  356 QTC Calculation: 480 R Axis:   -57  Text Interpretation: Atrial fibrillation Left bundle branch block Confirmed by Theadore Sharper [phone]) on 05/07/2024 11:36:19 PM       Labs Reviewed  BASIC METABOLIC PANEL WITH GFR - Abnormal; Notable for the following components:      Result Value   Chloride 96 (*)    Glucose, Bld 114 (*)    BUN 37 (*)    Creatinine, Ser 1.62 (*)    GFR, Estimated 30 (*)    All other components within normal limits  CBC - Abnormal; Notable for the following components:   WBC 18.4 (*)    All other components within normal limits  PROTIME-INR - Abnormal; Notable for the following components:   Prothrombin Time 17.9 (*)    INR 1.4 (*)    All other components within normal limits  I-STAT CHEM 8, ED - Abnormal; Notable for the following  components:   BUN 39 (*)    Creatinine, Ser 1.70 (*)    Glucose, Bld 117 (*)    Calcium, Ion 1.11 (*)    All other components within normal limits  TROPONIN I (HIGH SENSITIVITY)  TROPONIN I (HIGH SENSITIVITY)    DG Chest 2 View  Final Result      Medications  sodium chloride  flush (NS) 0.9 % injection 3 mL (3 mLs Intravenous Not Given 05/07/24 2241)  acetaminophen  (TYLENOL ) tablet 1,000 mg (1,000 mg Oral Given 05/08/24 0257)     Procedures  /  Critical  Care Procedures  ED Course and Medical Decision Making  Initial Impression and Ddx History of A-fib, pacemaker.  Minimal evidence of CAD back in 2018 during heart catheterization.  Still ACS is considered.  Could be GI in etiology given patient's preceding nausea and poor appetite.  Doubt PE.  Pain slowly resolving with time.  Received aspirin  with EMS.  Past medical/surgical history that increases complexity of ED encounter: Tachybradycardia syndrome status post pacemaker, A-fib  Interpretation of Diagnostics I personally reviewed the EKG and my interpretation is as follows: A-fib, some ST depression laterally  No significant blood count or electrolyte disturbance.  Troponin negative x 2  Patient Reassessment and Ultimate Disposition/Management     Some continued pain, high heart score, requesting hospitalist admission.  Patient management required discussion with the following services or consulting groups:  Hospitalist Service  Complexity of Problems Addressed Acute illness or injury that poses threat of life of bodily function  Additional Data Reviewed and Analyzed Further history obtained from: Further history from spouse/family member  Additional Factors Impacting ED Encounter Risk Consideration of hospitalization  Ozell HERO. Theadore, MD [facility] [email or web address]  Final Clinical Impressions(s) / ED Diagnoses     ICD-10-CM   1. Chest pain, unspecified type   R07.9       ED Discharge Orders     None        Discharge Instructions Discussed with and Provided to Patient:   Discharge Instructions   None      Theadore Ozell HERO, MD 05/08/24 [phone]

## 2024-05-08 ENCOUNTER — Encounter (HOSPITAL_COMMUNITY): Payer: Self-pay | Admitting: Internal Medicine

## 2024-05-08 ENCOUNTER — Observation Stay (HOSPITAL_COMMUNITY)

## 2024-05-08 ENCOUNTER — Emergency Department (HOSPITAL_COMMUNITY)

## 2024-05-08 DIAGNOSIS — I071 Rheumatic tricuspid insufficiency: Secondary | ICD-10-CM | POA: Diagnosis present

## 2024-05-08 DIAGNOSIS — M810 Age-related osteoporosis without current pathological fracture: Secondary | ICD-10-CM | POA: Diagnosis present

## 2024-05-08 DIAGNOSIS — I428 Other cardiomyopathies: Secondary | ICD-10-CM | POA: Diagnosis present

## 2024-05-08 DIAGNOSIS — R079 Chest pain, unspecified: Secondary | ICD-10-CM | POA: Diagnosis present

## 2024-05-08 DIAGNOSIS — I200 Unstable angina: Secondary | ICD-10-CM

## 2024-05-08 DIAGNOSIS — Z604 Social exclusion and rejection: Secondary | ICD-10-CM | POA: Diagnosis present

## 2024-05-08 DIAGNOSIS — N184 Chronic kidney disease, stage 4 (severe): Secondary | ICD-10-CM | POA: Diagnosis present

## 2024-05-08 DIAGNOSIS — I340 Nonrheumatic mitral (valve) insufficiency: Secondary | ICD-10-CM | POA: Diagnosis not present

## 2024-05-08 DIAGNOSIS — Z87891 Personal history of nicotine dependence: Secondary | ICD-10-CM | POA: Diagnosis not present

## 2024-05-08 DIAGNOSIS — E782 Mixed hyperlipidemia: Secondary | ICD-10-CM | POA: Diagnosis present

## 2024-05-08 DIAGNOSIS — D72829 Elevated white blood cell count, unspecified: Secondary | ICD-10-CM | POA: Insufficient documentation

## 2024-05-08 DIAGNOSIS — I442 Atrioventricular block, complete: Secondary | ICD-10-CM | POA: Diagnosis present

## 2024-05-08 DIAGNOSIS — R0789 Other chest pain: Secondary | ICD-10-CM

## 2024-05-08 DIAGNOSIS — I452 Bifascicular block: Secondary | ICD-10-CM | POA: Diagnosis present

## 2024-05-08 DIAGNOSIS — Z8249 Family history of ischemic heart disease and other diseases of the circulatory system: Secondary | ICD-10-CM | POA: Diagnosis not present

## 2024-05-08 DIAGNOSIS — Z79899 Other long term (current) drug therapy: Secondary | ICD-10-CM | POA: Diagnosis not present

## 2024-05-08 DIAGNOSIS — R071 Chest pain on breathing: Secondary | ICD-10-CM | POA: Diagnosis not present

## 2024-05-08 DIAGNOSIS — M109 Gout, unspecified: Secondary | ICD-10-CM | POA: Diagnosis present

## 2024-05-08 DIAGNOSIS — Z811 Family history of alcohol abuse and dependence: Secondary | ICD-10-CM | POA: Diagnosis not present

## 2024-05-08 DIAGNOSIS — Z950 Presence of cardiac pacemaker: Secondary | ICD-10-CM | POA: Diagnosis not present

## 2024-05-08 DIAGNOSIS — Z66 Do not resuscitate: Secondary | ICD-10-CM | POA: Diagnosis present

## 2024-05-08 DIAGNOSIS — Z8739 Personal history of other diseases of the musculoskeletal system and connective tissue: Secondary | ICD-10-CM

## 2024-05-08 DIAGNOSIS — I4821 Permanent atrial fibrillation: Secondary | ICD-10-CM | POA: Diagnosis present

## 2024-05-08 DIAGNOSIS — I482 Chronic atrial fibrillation, unspecified: Secondary | ICD-10-CM

## 2024-05-08 DIAGNOSIS — Z7901 Long term (current) use of anticoagulants: Secondary | ICD-10-CM | POA: Diagnosis not present

## 2024-05-08 DIAGNOSIS — I130 Hypertensive heart and chronic kidney disease with heart failure and stage 1 through stage 4 chronic kidney disease, or unspecified chronic kidney disease: Secondary | ICD-10-CM | POA: Diagnosis present

## 2024-05-08 DIAGNOSIS — I5022 Chronic systolic (congestive) heart failure: Secondary | ICD-10-CM | POA: Diagnosis present

## 2024-05-08 DIAGNOSIS — Z8673 Personal history of transient ischemic attack (TIA), and cerebral infarction without residual deficits: Secondary | ICD-10-CM | POA: Diagnosis not present

## 2024-05-08 DIAGNOSIS — Z801 Family history of malignant neoplasm of trachea, bronchus and lung: Secondary | ICD-10-CM | POA: Diagnosis not present

## 2024-05-08 DIAGNOSIS — N390 Urinary tract infection, site not specified: Secondary | ICD-10-CM | POA: Diagnosis present

## 2024-05-08 DIAGNOSIS — I495 Sick sinus syndrome: Secondary | ICD-10-CM | POA: Diagnosis present

## 2024-05-08 LAB — CBC WITH DIFFERENTIAL/PLATELET
Abs Immature Granulocytes: 0.17 10*3/uL — ABNORMAL HIGH (ref 0.00–0.07)
Basophils Absolute: 0.1 10*3/uL (ref 0.0–0.1)
Basophils Relative: 0 %
Eosinophils Absolute: 0 10*3/uL (ref 0.0–0.5)
Eosinophils Relative: 0 %
HCT: 34.6 % — ABNORMAL LOW (ref 36.0–46.0)
Hemoglobin: 10.9 g/dL — ABNORMAL LOW (ref 12.0–15.0)
Immature Granulocytes: 1 %
Lymphocytes Relative: 15 %
Lymphs Abs: 3.1 10*3/uL (ref 0.7–4.0)
MCH: 29.2 pg (ref 26.0–34.0)
MCHC: 31.5 g/dL (ref 30.0–36.0)
MCV: 92.8 fL (ref 80.0–100.0)
Monocytes Absolute: 1.6 10*3/uL — ABNORMAL HIGH (ref 0.1–1.0)
Monocytes Relative: 8 %
Neutro Abs: 15.3 10*3/uL — ABNORMAL HIGH (ref 1.7–7.7)
Neutrophils Relative %: 76 %
Platelets: 232 10*3/uL (ref 150–400)
RBC: 3.73 MIL/uL — ABNORMAL LOW (ref 3.87–5.11)
RDW: 13.9 % (ref 11.5–15.5)
WBC: 20.3 10*3/uL — ABNORMAL HIGH (ref 4.0–10.5)
nRBC: 0 % (ref 0.0–0.2)

## 2024-05-08 LAB — HEPATIC FUNCTION PANEL
ALT: 10 U/L (ref 0–44)
AST: 17 U/L (ref 15–41)
Albumin: 2.9 g/dL — ABNORMAL LOW (ref 3.5–5.0)
Alkaline Phosphatase: 68 U/L (ref 38–126)
Bilirubin, Direct: 0.2 mg/dL (ref 0.0–0.2)
Indirect Bilirubin: 0.8 mg/dL (ref 0.3–0.9)
Total Bilirubin: 1 mg/dL (ref 0.0–1.2)
Total Protein: 6.7 g/dL (ref 6.5–8.1)

## 2024-05-08 LAB — APTT: aPTT: 36 s (ref 24–36)

## 2024-05-08 LAB — BASIC METABOLIC PANEL WITH GFR
Anion gap: 14 (ref 5–15)
BUN: 37 mg/dL — ABNORMAL HIGH (ref 8–23)
CO2: 24 mmol/L (ref 22–32)
Calcium: 9 mg/dL (ref 8.9–10.3)
Chloride: 98 mmol/L (ref 98–111)
Creatinine, Ser: 1.71 mg/dL — ABNORMAL HIGH (ref 0.44–1.00)
GFR, Estimated: 28 mL/min — ABNORMAL LOW
Glucose, Bld: 104 mg/dL — ABNORMAL HIGH (ref 70–99)
Potassium: 4.1 mmol/L (ref 3.5–5.1)
Sodium: 136 mmol/L (ref 135–145)

## 2024-05-08 LAB — C-REACTIVE PROTEIN: CRP: 13.3 mg/dL — ABNORMAL HIGH (ref <1.0)

## 2024-05-08 LAB — BRAIN NATRIURETIC PEPTIDE: B Natriuretic Peptide: 1033.2 pg/mL — ABNORMAL HIGH (ref 0.0–100.0)

## 2024-05-08 LAB — SEDIMENTATION RATE: Sed Rate: 63 mm/h — ABNORMAL HIGH (ref 0–22)

## 2024-05-08 LAB — ECHOCARDIOGRAM COMPLETE
Area-P 1/2: 6.12 cm2
Height: 63 in
S' Lateral: 1.9 cm
Weight: 2000 [oz_av]

## 2024-05-08 LAB — HEPARIN LEVEL (UNFRACTIONATED): Heparin Unfractionated: >1.1 [IU]/mL — ABNORMAL HIGH (ref 0.30–0.70)

## 2024-05-08 LAB — TROPONIN I (HIGH SENSITIVITY): Troponin I (High Sensitivity): 14 ng/L (ref <18)

## 2024-05-08 LAB — DIGOXIN LEVEL: Digoxin Level: <0.6 ng/mL — ABNORMAL LOW (ref 0.8–2.0)

## 2024-05-08 MED ORDER — HEPARIN (PORCINE) 25000 UT/250ML-% IV SOLN
750.0000 [IU]/h | INTRAVENOUS | Status: DC
  Administered 2024-05-08: 750 [IU]/h via INTRAVENOUS
  Filled 2024-05-08: qty 250

## 2024-05-08 MED ORDER — FUROSEMIDE 10 MG/ML IJ SOLN
40.0000 mg | Freq: Two times a day (BID) | INTRAMUSCULAR | Status: AC
  Administered 2024-05-08: 40 mg via INTRAVENOUS
  Filled 2024-05-08: qty 4

## 2024-05-08 MED ORDER — ACETAMINOPHEN 325 MG PO TABS: 650.0000 mg | ORAL_TABLET | Freq: Four times a day (QID) | ORAL | Status: DC | PRN

## 2024-05-08 MED ORDER — GLUCAGON HCL RDNA (DIAGNOSTIC) 1 MG IJ SOLR: 1.0000 mg | INTRAMUSCULAR | Status: DC | PRN

## 2024-05-08 MED ORDER — ALUM & MAG HYDROXIDE-SIMETH 200-200-20 MG/5ML PO SUSP
30.0000 mL | Freq: Once | ORAL | Status: AC
  Administered 2024-05-08: 30 mL via ORAL
  Filled 2024-05-08: qty 30

## 2024-05-08 MED ORDER — FUROSEMIDE 20 MG PO TABS: 60.0000 mg | ORAL_TABLET | Freq: Every day | ORAL | Status: DC

## 2024-05-08 MED ORDER — FUROSEMIDE 20 MG PO TABS
40.0000 mg | ORAL_TABLET | Freq: Every day | ORAL | Status: DC
  Administered 2024-05-08: 40 mg via ORAL
  Filled 2024-05-08: qty 2

## 2024-05-08 MED ORDER — ACETAMINOPHEN 500 MG PO TABS
1000.0000 mg | ORAL_TABLET | Freq: Once | ORAL | Status: AC
  Administered 2024-05-08: 1000 mg via ORAL
  Filled 2024-05-08: qty 2

## 2024-05-08 MED ORDER — ACETAMINOPHEN 650 MG RE SUPP: 650.0000 mg | Freq: Four times a day (QID) | RECTAL | Status: DC | PRN

## 2024-05-08 MED ORDER — HYDRALAZINE HCL 20 MG/ML IJ SOLN: 10.0000 mg | INTRAMUSCULAR | Status: DC | PRN

## 2024-05-08 MED ORDER — FUROSEMIDE 10 MG/ML IJ SOLN
40.0000 mg | Freq: Two times a day (BID) | INTRAMUSCULAR | Status: DC
Start: 2024-05-08 — End: 2024-05-08

## 2024-05-08 MED ORDER — CALCIUM GLUCONATE-NACL 2-0.675 GM/100ML-% IV SOLN
2.0000 g | Freq: Once | INTRAVENOUS | Status: AC
  Administered 2024-05-08: 2000 mg via INTRAVENOUS
  Filled 2024-05-08 (×2): qty 100

## 2024-05-08 MED ORDER — PRAVASTATIN SODIUM 40 MG PO TABS
20.0000 mg | ORAL_TABLET | Freq: Every day | ORAL | Status: DC
  Administered 2024-05-08: 20 mg via ORAL
  Filled 2024-05-08: qty 1

## 2024-05-08 MED ORDER — METOPROLOL TARTRATE 50 MG PO TABS
100.0000 mg | ORAL_TABLET | Freq: Two times a day (BID) | ORAL | Status: DC
  Administered 2024-05-08 – 2024-05-09 (×3): 100 mg via ORAL
  Filled 2024-05-08: qty 2
  Filled 2024-05-08: qty 4
  Filled 2024-05-08: qty 2

## 2024-05-08 MED ORDER — LORAZEPAM 0.5 MG PO TABS: 0.5000 mg | ORAL_TABLET | Freq: Every evening | ORAL | Status: DC | PRN

## 2024-05-08 MED ORDER — IPRATROPIUM-ALBUTEROL 0.5-2.5 (3) MG/3ML IN SOLN: 3.0000 mL | RESPIRATORY_TRACT | Status: DC | PRN

## 2024-05-08 MED ORDER — RIVAROXABAN 15 MG PO TABS
15.0000 mg | ORAL_TABLET | Freq: Every evening | ORAL | Status: DC
  Administered 2024-05-08: 15 mg via ORAL
  Filled 2024-05-08 (×2): qty 1

## 2024-05-08 MED ORDER — METOPROLOL TARTRATE 5 MG/5ML IV SOLN: 5.0000 mg | INTRAVENOUS | Status: DC | PRN

## 2024-05-08 MED ORDER — FUROSEMIDE 20 MG PO TABS: 40.0000 mg | ORAL_TABLET | Freq: Two times a day (BID) | ORAL | Status: DC

## 2024-05-08 MED ORDER — DIGOXIN 0.0625 MG HALF TABLET
0.0625 mg | ORAL_TABLET | ORAL | Status: DC
  Administered 2024-05-08: 0.0625 mg via ORAL
  Filled 2024-05-08: qty 1

## 2024-05-08 MED ORDER — FEBUXOSTAT 40 MG PO TABS
20.0000 mg | ORAL_TABLET | Freq: Every day | ORAL | Status: DC
  Administered 2024-05-08: 20 mg via ORAL
  Filled 2024-05-08 (×2): qty 1

## 2024-05-08 NOTE — Plan of Care (Signed)

## 2024-05-08 NOTE — Progress Notes (Addendum)
 PHARMACY - ANTICOAGULATION CONSULT NOTE  Pharmacy Consult for Heparin  Indication: atrial fibrillation  No Known Allergies  Patient Measurements: Height: 5' 3 (160 cm) Weight: 56.7 kg (125 lb) IBW/kg (Calculated) : 52.4 HEPARIN  DW (KG): 56.7  Vital Signs: Temp: 97.7 F (36.5 C) (11/03 0400) Temp Source: Oral (11/03 0400) BP: 108/62 (11/03 0400) Pulse Rate: 83 (11/03 0400)  Labs: Recent Labs    05/07/24 2254 05/07/24 2258 05/08/24 0116  HGB 12.2 12.9  --   HCT 38.8 38.0  --   PLT 242  --   --   LABPROT 17.9*  --   --   INR 1.4*  --   --   CREATININE 1.62* 1.70*  --   TROPONINIHS 11  --  14    Estimated Creatinine Clearance: 17.5 mL/min (A) (by C-G formula based on SCr of 1.7 mg/dL (H)).   Medical History: Past Medical History:  Diagnosis Date   Acute gangrenous appendicitis with perforation and peritonitis 11/22/2011   Afib (HCC) 2008   CVA (cerebral infarction) 2008   no residual deficits   Osteoporosis    Tachycardia-bradycardia syndrome (HCC) 01/28/2021    Medications:  No current facility-administered medications on file prior to encounter.   Current Outpatient Medications on File Prior to Encounter  Medication Sig Dispense Refill   acetaminophen  (TYLENOL ) 500 MG tablet Take 1,000 mg by mouth 2 (two) times daily. Take two tablets (1000mg ) by mouth twice a day at 0300 and 1500.     Cholecalciferol 50 MCG (2000 UT) CAPS Take 2,000 Units by mouth daily after supper. Take one softgel (2000iu) by mouth daily after supper     digoxin  (LANOXIN ) 0.125 MG tablet TAKE 1/2 TABLET BY MOUTH EVERY OTHER DAY (Patient taking differently: Take 0.0625 mg by mouth every other day. Take one-half tablet (0.0625mg ) by mouth every other day after supper.) 45 tablet 3   febuxostat  (ULORIC ) 40 MG tablet TAKE 1/2 TABLET BY MOUTH DAILY (Patient taking differently: Take 20 mg by mouth daily after supper. Take one-half tablet (20mg ) by mouth daily after supper.) 45 tablet 1    furosemide  (LASIX ) 20 MG tablet 2 in am and 3 in pm. (Patient taking differently: Take 40-60 mg by mouth 2 (two) times daily. Take three tablets (60mg ) by mouth in the morning,  and two tablets  (40mg ) in the evening) 1 tablet 0   LORazepam  (ATIVAN ) 0.5 MG tablet Take 1 tablet (0.5 mg total) by mouth at bedtime. (Patient taking differently: Take 0.5 mg by mouth at bedtime as needed for sleep.) 30 tablet 2   metoprolol  tartrate (LOPRESSOR ) 100 MG tablet Take 1 tablet (100 mg total) by mouth 2 (two) times daily. 180 tablet 3   OVER THE COUNTER MEDICATION Apply 1 Application topically daily as needed (Leg  and back pain). MagniLife Leg and Back Pain Relief Cream     pravastatin  (PRAVACHOL ) 20 MG tablet TAKE 1 TABLET(20 MG) BY MOUTH DAILY (Patient taking differently: Take 20 mg by mouth daily after supper. Take one tablet (20mg ) by mouth every evening after supper.) 90 tablet 1   Propylene Glycol 0.6 % SOLN Place 1 drop into both eyes in the morning.     XARELTO  15 MG TABS tablet TAKE ONE TABLET BY MOUTH DAILY with supper (Patient taking differently: Take 15 mg by mouth daily after supper. Take one tablet (15mg ) by mouth daily after supper.) 90 tablet 1   [DISCONTINUED] dapagliflozin  propanediol (FARXIGA ) 5 MG TABS tablet TAKE ONE TABLET BY MOUTH DAILY  BEFORE BREAKFAST (Patient not taking: No sig reported) 30 tablet 1   [DISCONTINUED] estradiol  (ESTRACE ) 0.1 MG/GM vaginal cream Place 1 Applicatorful vaginally every other day. (Patient not taking: Reported on 05/08/2024) 42.5 g 3   [DISCONTINUED] nitrofurantoin , macrocrystal-monohydrate, (MACROBID ) 100 MG capsule Take 1 capsule (100 mg total) by mouth daily. (Patient not taking: No sig reported) 30 capsule 2     Assessment: 89 y.o. female admitted with chest pain, h/o Afib and Xarelto  on hold, for heparin .  Last dose of Xarelto  11/1  Goal of Therapy:  aPTT 66-102 sec Heparin  level 0.3-0.7 units/ml Monitor platelets by anticoagulation protocol: Yes    Plan:  Start heparin  750 units/hr Check aPTT in 8 hours   Dianna Ewald, Cordella Misty 05/08/2024,5:57 AM

## 2024-05-08 NOTE — H&P (Signed)
 History and Physical    Tracy Vasquez FMW:[id number] DOB: 1930-02-01 DOA: 05/07/2024  Patient coming from: Independent living facility.  Chief Complaint: Chest pain.  HPI: Tracy Vasquez is a 89 y.o. female with history of permanent A-fib, complete heart block status post pacemaker placement, cardiomyopathy last EF measured was 30 to 35% in 2018, chronic kidney disease stage III, history of gout, prior stroke, hyperlipidemia was brought to the ER after patient was complaining of chest pain.  Patient states the chest pain started last evening around dinnertime.  Denies any associated abdominal pain nausea or vomiting.  No associated shortness of breath.  Chest pain is mostly in the lower part of her sternum with no radiation.  Pain persisted almost for about 2 to 3 hours.  Patient says the pain is more like a dull pressure-like at times increased on deep inspiration.  Patient states the chest pain improved after EMS gave aspirin .  ED Course: In the ER EKG shows A-fib with RBBB.  Troponins are negative.  WBC was 18.4 but patient was afebrile.  Chest x-ray shows nonspecific findings.  Patient admitted for further observation.  Review of Systems: As per HPI, rest all negative.   Past Medical History:  Diagnosis Date   Acute gangrenous appendicitis with perforation and peritonitis 11/22/2011   Afib (HCC) 2008   CVA (cerebral infarction) 2008   no residual deficits   Osteoporosis    Tachycardia-bradycardia syndrome (HCC) 01/28/2021    Past Surgical History:  Procedure Laterality Date   INSERT / REPLACE / REMOVE PACEMAKER     LAPAROSCOPIC APPENDECTOMY  11/22/2011   Procedure: APPENDECTOMY LAPAROSCOPIC;  Surgeon: Jina Nephew, MD;  Location: [facility] OR;  Service: General;  Laterality: N/A;   PACEMAKER INSERTION     PPM GENERATOR CHANGEOUT N/A 08/16/2017   Procedure: PPM GENERATOR CHANGEOUT;  Surgeon: Waddell Danelle ORN, MD;  Location: [facility] INVASIVE CV LAB;  Service: Cardiovascular;  Laterality: N/A;    RIGHT/LEFT HEART CATH AND CORONARY ANGIOGRAPHY N/A 02/01/2017   Procedure: Right/Left Heart Cath and Coronary Angiography;  Surgeon: Burnard Debby LABOR, MD;  Location: [facility] INVASIVE CV LAB;  Service: Cardiovascular;  Laterality: N/A;   TONSILLECTOMY AND ADENOIDECTOMY  1933     reports that she quit smoking about 50 years ago. Her smoking use included cigarettes. She has never used smokeless tobacco. She reports that she does not drink alcohol and does not use drugs.  No Known Allergies  Family History  Problem Relation Age of Onset   Coronary artery disease Mother    Heart attack Mother    Atrial fibrillation Father    Lung cancer Father    Alcohol abuse Brother    Failure to thrive Brother     Prior to Admission medications   Medication Sig Start Date End Date Taking? Authorizing Provider  acetaminophen  (TYLENOL ) 500 MG tablet Take 1,000 mg by mouth 2 (two) times daily. Take two tablets (1000mg ) by mouth twice a day at 0300 and 1500.   Yes [provider]  Cholecalciferol 50 MCG (2000 UT) CAPS Take 2,000 Units by mouth daily after supper. Take one softgel (2000iu) by mouth daily after supper   Yes [provider]  digoxin  (LANOXIN ) 0.125 MG tablet TAKE 1/2 TABLET BY MOUTH EVERY OTHER DAY Patient taking differently: Take 0.0625 mg by mouth every other day. Take one-half tablet (0.0625mg ) by mouth every other day after supper. 07/14/23  Yes Waddell Danelle ORN, MD  febuxostat  (ULORIC ) 40 MG tablet TAKE 1/2 TABLET BY MOUTH DAILY  Patient taking differently: Take 20 mg by mouth daily after supper. Take one-half tablet (20mg ) by mouth daily after supper. 11/18/23  Yes Cox, Kirsten, MD  furosemide  (LASIX ) 20 MG tablet 2 in am and 3 in pm. Patient taking differently: Take 40-60 mg by mouth 2 (two) times daily. Take three tablets (60mg ) by mouth in the morning,  and two tablets  (40mg ) in the evening 07/14/22  Yes Cox, Kirsten, MD  LORazepam  (ATIVAN ) 0.5 MG tablet Take 1 tablet (0.5 mg total)  by mouth at bedtime. Patient taking differently: Take 0.5 mg by mouth at bedtime as needed for sleep. 02/25/24  Yes Cox, Kirsten, MD  metoprolol  tartrate (LOPRESSOR ) 100 MG tablet Take 1 tablet (100 mg total) by mouth 2 (two) times daily. 03/14/24  Yes Waddell Danelle ORN, MD  OVER THE COUNTER MEDICATION Apply 1 Application topically daily as needed (Leg  and back pain). MagniLife Leg and Back Pain Relief Cream   Yes [provider]  pravastatin  (PRAVACHOL ) 20 MG tablet TAKE 1 TABLET(20 MG) BY MOUTH DAILY Patient taking differently: Take 20 mg by mouth daily after supper. Take one tablet (20mg ) by mouth every evening after supper. 12/06/23  Yes Sirivol, Mamatha, MD  Propylene Glycol 0.6 % SOLN Place 1 drop into both eyes in the morning.   Yes [provider]  XARELTO  15 MG TABS tablet TAKE ONE TABLET BY MOUTH DAILY with supper Patient taking differently: Take 15 mg by mouth daily after supper. Take one tablet (15mg ) by mouth daily after supper. 02/21/24  Yes Waddell Danelle ORN, MD    Physical Exam: Constitutional: Moderately built and nourished. Vitals:   05/08/24 0130 05/08/24 0245 05/08/24 0300 05/08/24 0400  BP: (!) 87/68 119/70 116/70 108/62  Pulse: 85 (!) 101 82 83  Resp: 18 19 18 18   Temp:    97.7 F (36.5 C)  TempSrc:    Oral  SpO2: 90% 95% 94% 96%  Weight:      Height:       Eyes: Anicteric no pallor. ENMT: No discharge from the ears eyes nose or mouth. Neck: No mass felt.  No neck rigidity. Respiratory: No rhonchi or crepitations. Cardiovascular: S1-S2 heard. Abdomen: Soft nontender bowel sound present. Musculoskeletal: No edema. Skin: No rash. Neurologic: Alert awake oriented to time place and person.  Moves all extremities. Psychiatric: Appears normal.  Normal affect.   Labs on Admission: I have personally reviewed following labs and imaging studies  CBC: Recent Labs  Lab 05/07/24 2254 05/07/24 2258  WBC 18.4*  --   HGB 12.2 12.9  HCT 38.8 38.0  MCV 92.8   --   PLT 242  --    Basic Metabolic Panel: Recent Labs  Lab 05/07/24 2254 05/07/24 2258  NA 135 136  K 4.7 4.5  CL 96* 99  CO2 27  --   GLUCOSE 114* 117*  BUN 37* 39*  CREATININE 1.62* 1.70*  CALCIUM 9.1  --    GFR: Estimated Creatinine Clearance: 17.5 mL/min (A) (by C-G formula based on SCr of 1.7 mg/dL (H)). Liver Function Tests: No results for input(s): AST, ALT, ALKPHOS, BILITOT, PROT, ALBUMIN in the last 168 hours. No results for input(s): LIPASE, AMYLASE in the last 168 hours. No results for input(s): AMMONIA in the last 168 hours. Coagulation Profile: Recent Labs  Lab 05/07/24 2254  INR 1.4*   Cardiac Enzymes: No results for input(s): CKTOTAL, CKMB, CKMBINDEX, TROPONINI in the last 168 hours. BNP (last 3 results) No results for  input(s): PROBNP in the last 8760 hours. HbA1C: No results for input(s): HGBA1C in the last 72 hours. CBG: No results for input(s): GLUCAP in the last 168 hours. Lipid Profile: No results for input(s): CHOL, HDL, LDLCALC, TRIG, CHOLHDL, LDLDIRECT in the last 72 hours. Thyroid Function Tests: No results for input(s): TSH, T4TOTAL, FREET4, T3FREE, THYROIDAB in the last 72 hours. Anemia Panel: No results for input(s): VITAMINB12, FOLATE, FERRITIN, TIBC, IRON, RETICCTPCT in the last 72 hours. Urine analysis:    Component Value Date/Time   COLORURINE YELLOW 04/16/2021 1205   APPEARANCEUR Cloudy (A) 11/16/2022 1436   LABSPEC 1.011 04/16/2021 1205   PHURINE 5.0 04/16/2021 1205   GLUCOSEU Negative 11/16/2022 1436   HGBUR MODERATE (A) 04/16/2021 1205   BILIRUBINUR negative 11/23/2023 1346   BILIRUBINUR Negative 11/16/2022 1436   KETONESUR negative 11/23/2023 1346   KETONESUR NEGATIVE 04/16/2021 1205   PROTEINUR Trace 11/16/2022 1436   PROTEINUR 100 (A) 04/16/2021 1205   UROBILINOGEN 0.2 11/23/2023 1346   UROBILINOGEN 1.0 08/19/2012 2055   NITRITE Negative 11/23/2023  1346   NITRITE Negative 11/16/2022 1436   NITRITE POSITIVE (A) 04/16/2021 1205   LEUKOCYTESUR Large (3+) (A) 11/23/2023 1346   LEUKOCYTESUR 3+ (A) 11/16/2022 1436   LEUKOCYTESUR LARGE (A) 04/16/2021 1205   Sepsis Labs: @LABRCNTIP (procalcitonin:4,lacticidven:4) )No results found for this or any previous visit (from the past 240 hours).   Radiological Exams on Admission: DG Chest 2 View Result Date: 05/08/2024 EXAM: 2 VIEW(S) XRAY OF THE CHEST 05/08/2024 12:36:46 AM COMPARISON: 05/17/2022 CLINICAL HISTORY: chest pain chest pain FINDINGS: LINES, TUBES AND DEVICES: Left pacer remains in place, unchanged. LUNGS AND PLEURA: Vascular congestion. No focal pulmonary opacity. No pulmonary edema. No pleural effusion. No pneumothorax. HEART AND MEDIASTINUM: Cardiomegaly. Aortic atherosclerosis. BONES AND SOFT TISSUES: No acute osseous abnormality. IMPRESSION: 1. Cardiomegaly with vascular congestion. 2. Aortic atherosclerosis. 3. Left pacer in place, unchanged. Electronically signed by: Franky Crease MD 05/08/2024 12:51 AM EST RP Workstation: [id number]    EKG: Independently reviewed.  A-fib with LBBB.  Assessment/Plan Principal Problem:   Chest pain Active Problems:   Chronic systolic CHF (congestive heart failure) (HCC)   Chronic atrial fibrillation (HCC)   Chronic kidney disease, stage 4 (severe) (HCC)   Mixed hyperlipidemia   History of gout   Leukocytosis    Chest pain -    some typical and atypical features.  Will consult cardiology.  Check 2D echo. History of permanent A-fib rate controlled on digoxin  and metoprolol .  Last Xarelto  dose was more than 24 hours ago.  Will keep patient on heparin  infusion for now if in case patient needs cardiac procedure. Complete heart block status post pacemaker placement. Cardiomyopathy and last EF measured was 30 to 35% 2018.  Appears compensated.  Continue Lasix  digoxin .  Digoxin  levels are pending. History of gout on Uloric . Chronic kidney disease  stage III creatinine around baseline. History of stroke and hyperlipidemia on statins. Leukocytosis cause not clear.  Patient is afebrile.  No signs of infection will closely monitor.  Since patient has chest pain will need further monitoring and workup and more than 2 midnight stay.   DVT prophylaxis: Heparin . Code Status:.  DNR. Family Communication: Family at the bedside. Disposition Plan: Monitored bed. Consults called: Cardiology. Admission status: Observation.

## 2024-05-08 NOTE — Consult Note (Addendum)
 Cardiology Consultation   Patient ID: Tracy Vasquez MRN: [id number]; DOB: [birth year]  Admit date: 05/07/2024 Date of Consult: 05/08/2024  PCP:  Sherre Clapper, MD   [facility] Providers Cardiologist:  None        Patient Profile: Tracy Vasquez is a 89 y.o. female with a hx of hypertension, persistent atrial fibrillation on anticoagulation, Tachy-brady syndrome s/p PPM with GEN change in 2019, non-ischemic cardiomyopathy, LBBB, hx of CVA  who is being seen 05/08/2024 for the evaluation of chest pain at the request of Tracy Cleaver MD.  History of Present Illness: Tracy Vasquez underwent RHC/LHC in 2018 and at that time patient had minimal coronary artery disease with a mid RCA lesion with 10% stenosis.  This was done for symptoms of chest pain and progressive SOB.  Her most recent echocardiogram was during this time which showed LVEF 30 to 35% akinesis of the mid apical anterior septal and apical myocardium.  Moderate to severe MR.  Severely dilated LA.  Dilated RA.  Moderate TR.  Moderately increased PASP 42 mmHg.   She was last seen by Dr. Waddell on 03/2024 and at that time she was asymptomatic.  Presented to the ED via EMS on 11/2 for chest pain that started yesterday evening.  She was given aspirin  load.  Pain was resolved by arrival to ED. BP: 139/78    HR 109 ECG AF with LBBB VR 109 [similar to previous] CXR showed cardiomegaly with vascular congestion Echocardiogram showed LVEF 50 to 55% with no RWMA.  RV systolic function and size is normal. IV septum flattened consistent with RV volume overload.  RVSP estimated at 61 mmHg. Severely dilated LA and RA.  Moderate MR.  Severe TR.  AR mild to moderate.  Recommendation to obtain TEE.   Pertinent lab work: Cr ~1.7    WBC 18.4  -> 20.3     Hgb 12.9 -> 10.9 Albumin 2.9 Troponin 11 -> 14     BNP 1033 Digoxin  < 0.6  Received GI cocktail, PO and IV Lasix , started on IV heparin .  On interview, patient shared that yesterday  evening at dinnertime she was not feeling well, she reported allover body aches.  She went to bed instead of eating dinner, and when she laid down noticed acute chest pain.  She stated that it is a more sharp but sometimes dull chest pain centrally located worse with coughing and inspiration.  Has not found positional changes exacerbating or helpful.  She did find some relief after an aspirin  load.  No relief after the GI cocktail.  Currently having chest pain. No recent illness. Denied lightheadedness, dizziness, palpitations, and shortness of breath.  Denied peripheral edema. Denied increased urine output with Lasix .  Past Medical History:  Diagnosis Date   Acute gangrenous appendicitis with perforation and peritonitis 11/22/2011   Afib (HCC) 2008   CVA (cerebral infarction) 2008   no residual deficits   Osteoporosis    Tachycardia-bradycardia syndrome (HCC) 01/28/2021    Past Surgical History:  Procedure Laterality Date   INSERT / REPLACE / REMOVE PACEMAKER     LAPAROSCOPIC APPENDECTOMY  11/22/2011   Procedure: APPENDECTOMY LAPAROSCOPIC;  Surgeon: Tracy Nephew, MD;  Location: [facility] OR;  Service: General;  Laterality: N/A;   PACEMAKER INSERTION     PPM GENERATOR CHANGEOUT N/A 08/16/2017   Procedure: PPM GENERATOR CHANGEOUT;  Surgeon: Tracy Danelle ORN, MD;  Location: [facility] INVASIVE CV LAB;  Service: Cardiovascular;  Laterality: N/A;   RIGHT/LEFT HEART CATH AND CORONARY ANGIOGRAPHY  N/A 02/01/2017   Procedure: Right/Left Heart Cath and Coronary Angiography;  Surgeon: Tracy Debby LABOR, MD;  Location: [facility] INVASIVE CV LAB;  Service: Cardiovascular;  Laterality: N/A;   TONSILLECTOMY AND ADENOIDECTOMY  1935       Scheduled Meds:  digoxin   0.0625 mg Oral QODAY   febuxostat   20 mg Oral QPC supper   furosemide   40 mg Intravenous BID   metoprolol  tartrate  100 mg Oral BID   pravastatin   20 mg Oral QPC supper   sodium chloride  flush  3 mL Intravenous Once   Continuous Infusions:  calcium gluconate      heparin  750 Units/hr (05/08/24 0635)   PRN Meds: acetaminophen  **OR** acetaminophen , glucagon (human recombinant), hydrALAZINE, ipratropium-albuterol, LORazepam , metoprolol  tartrate  Allergies:   No Known Allergies  Social History:   Social History   Socioeconomic History   Marital status: Widowed    Spouse name: Not on file   Number of children: 3   Years of education: Not on file   Highest education level: Not on file  Occupational History   Occupation: Retired  Tobacco Use   Smoking status: Former    Current packs/day: 0.00    Types: Cigarettes    Quit date: 1975    Years since quitting: 50.8   Smokeless tobacco: Never  Vaping Use   Vaping status: Never Used  Substance and Sexual Activity   Alcohol use: No   Drug use: No   Sexual activity: Not Currently  Other Topics Concern   Not on file  Social History Narrative   Lives with dtr, moved to [place] from [place] in 2014, widowed early 2013 (60 years married)   Social Drivers of Corporate Investment Banker Strain: Low Risk  (09/28/2023)   Overall Financial Resource Strain (CARDIA)    Difficulty of Paying Living Expenses: Not hard at all  Food Insecurity: No Food Insecurity (05/08/2024)   Hunger Vital Sign    Worried About Running Out of Food in the Last Year: Never true    Ran Out of Food in the Last Year: Never true  Transportation Needs: No Transportation Needs (05/08/2024)   PRAPARE - Administrator, Civil Service (Medical): No    Lack of Transportation (Non-Medical): No  Physical Activity: Sufficiently Active (11/23/2023)   Exercise Vital Sign    Days of Exercise per Week: 3 days    Minutes of Exercise per Session: 60 min  Stress: No Stress Concern Present (09/28/2023)   [employer] of Occupational Health - Occupational Stress Questionnaire    Feeling of Stress : Not at all  Social Connections: Moderately Isolated (05/08/2024)   Social Connection and Isolation Panel    Frequency of  Communication with Friends and Family: More than three times a week    Frequency of Social Gatherings with Friends and Family: More than three times a week    Attends Religious Services: 1 to 4 times per year    Active Member of [employer] or Organizations: No    Attends Banker Meetings: Never    Marital Status: Widowed  Intimate Partner Violence: Not At Risk (05/08/2024)   Humiliation, Afraid, Rape, and Kick questionnaire    Fear of Current or Ex-Partner: No    Emotionally Abused: No    Physically Abused: No    Sexually Abused: No    Family History:   Family History  Problem Relation Age of Onset   Coronary artery disease Mother    Heart  attack Mother    Atrial fibrillation Father    Lung cancer Father    Alcohol abuse Brother    Failure to thrive Brother      ROS:  Please see the history of present illness.  All other ROS reviewed and negative.     Physical Exam/Data: Vitals:   05/08/24 0800 05/08/24 1130 05/08/24 1300 05/08/24 1336  BP: 111/80 107/65 (!) 107/53   Pulse: 83 80 83   Resp: 20 (!) 25 (!) 24   Temp:    99.3 F (37.4 C)  TempSrc:    Oral  SpO2: 92% 91% 91%   Weight:      Height:       No intake or output data in the 24 hours ending 05/08/24 1340    05/07/2024   10:35 PM 03/17/2024    1:55 PM 11/23/2023    1:36 PM  Last 3 Weights  Weight (lbs) 125 lb 127 lb 4.8 oz 125 lb  Weight (kg) 56.7 kg 57.743 kg 56.7 kg     Body mass index is 22.14 kg/m.  General: Frail, pleasant older woman in no acute distress HEENT: normal Neck: JVD Vascular:  Distal pulses 2+ bilaterally Cardiac:  normal S1, S2; RRR; no murmur.  Tenderness to central chest with palpation. Lungs:  clear to auscultation bilaterally, no wheezing, rhonchi or rales  Abd: soft, nontender, no hepatomegaly  Ext: no edema Musculoskeletal:  No deformities, BUE and BLE strength normal and equal Skin: warm and dry  Neuro:  CNs 2-12 intact, no focal abnormalities noted Psych:  Normal  affect   EKG:  The EKG was personally reviewed and demonstrates: See HPI Telemetry:  Telemetry was personally reviewed and demonstrates: AF with LBBB HR 85- 90, occasional V paced beat and occasional PVC  Relevant CV Studies: Echocardiogram 01/2017 Study Conclusions   - Left ventricle: The cavity size was normal. Wall thickness was    normal. Systolic function was moderately to severely reduced. The    estimated ejection fraction was in the range of 30% to 35%.    Akinesis of the mid-apicalanteroseptal and apical myocardium.  - Aortic valve: There was mild regurgitation.  - Mitral valve: There was moderate to severe regurgitation.  - Left atrium: The atrium was moderately to severely dilated.  - Right atrium: The atrium was mildly dilated.  - Tricuspid valve: There was moderate regurgitation.  - Pulmonary arteries: Systolic pressure was moderately increased.    PA peak pressure: 42 mm Hg (S).   RHC/LHC 2018  Mid RCA lesion, 10 %stenosed. There is moderate left ventricular systolic dysfunction. LV end diastolic pressure is normal. There is trivial (1+) mitral regurgitation.   Normal right heart pressures.   Mild-to-moderate LV dysfunction with an ejection fraction at 40% with global hypocontractility.  There is 1+ angiographic mitral regurgitation.   No significant coronary obstructive disease with smooth 10% narrowing in the mid RCA.   RECOMMENDATION: Medical therapy.  Laboratory Data: High Sensitivity Troponin:   Recent Labs  Lab 05/07/24 2254 05/08/24 0116  TROPONINIHS 11 14     Chemistry Recent Labs  Lab 05/07/24 2254 05/07/24 2258 05/08/24 0542  NA 135 136 136  K 4.7 4.5 4.1  CL 96* 99 98  CO2 27  --  24  GLUCOSE 114* 117* 104*  BUN 37* 39* 37*  CREATININE 1.62* 1.70* 1.71*  CALCIUM 9.1  --  9.0  GFRNONAA 30*  --  28*  ANIONGAP 12  --  14  Recent Labs  Lab 05/08/24 0542  PROT 6.7  ALBUMIN 2.9*  AST 17  ALT 10  ALKPHOS 68  BILITOT 1.0    Hematology Recent Labs  Lab 05/07/24 2254 05/07/24 2258 05/08/24 0542  WBC 18.4*  --  20.3*  RBC 4.18  --  3.73*  HGB 12.2 12.9 10.9*  HCT 38.8 38.0 34.6*  MCV 92.8  --  92.8  MCH 29.2  --  29.2  MCHC 31.4  --  31.5  RDW 13.7  --  13.9  PLT 242  --  232    BNP Recent Labs  Lab 05/08/24 0542  BNP 1,033.2*     Radiology/Studies:  ECHOCARDIOGRAM COMPLETE Result Date: 05/08/2024    ECHOCARDIOGRAM REPORT   Patient Name:   Tracy Vasquez Date of Exam: 05/08/2024 Medical Rec #:  [id number]     Height:       63.0 in Accession #:    [id number]    Weight:       125.0 lb Date of Birth:  [birth year]     BSA:          1.584 m Patient Age:    89 years      BP:           101/80 mmHg Patient Gender: F             HR:           87 bpm. Exam Location:  Inpatient Procedure: 2D Echo (Both Spectral and Color Flow Doppler were utilized during            procedure). Indications:    chest pain  History:        Patient has prior history of Echocardiogram examinations. CHF;                 Arrythmias:Atrial Fibrillation.  Sonographer:    Charmaine Gaskins Referring Phys: [id number] ARSHAD N KAKRAKANDY IMPRESSIONS  1. Left ventricular ejection fraction, by estimation, is 50 to 55%. The left ventricle has low normal function. The left ventricle has no regional wall motion abnormalities. Left ventricular diastolic function could not be evaluated. There is the interventricular septum is flattened in diastole ('D' shaped left ventricle), consistent with right ventricular volume overload.  2. RVSP estimated at 61 mmHg. Right ventricular systolic function is normal. The right ventricular size is normal.  3. Left atrial size was severely dilated.  4. Lead in RA. Right atrial size was severely dilated.  5. Moderate MR by PISA and ikely functional in setting of dilated LA.SABRA The mitral valve is normal in structure. Moderate mitral valve regurgitation. No evidence of mitral stenosis.  6. Severe TR, likely in setting of leading  impingement (#71). Tricuspid valve regurgitation is severe.  7. The aortic valve is normal in structure. There is mild thickening of the aortic valve. Aortic valve regurgitation is mild to moderate. No aortic stenosis is present.  8. The inferior vena cava is dilated in size with <50% respiratory variability, suggesting right atrial pressure of 15 mmHg. Conclusion(s)/Recommendation(s): Recommend TEE to assess both TR and MR. FINDINGS  Left Ventricle: Left ventricular ejection fraction, by estimation, is 50 to 55%. The left ventricle has low normal function. The left ventricle has no regional wall motion abnormalities. The left ventricular internal cavity size was normal in size. There is no left ventricular hypertrophy. The interventricular septum is flattened in diastole ('D' shaped left ventricle), consistent with right ventricular volume overload. Left ventricular diastolic function could not be  evaluated due to atrial fibrillation. Left ventricular diastolic function could not be evaluated. Right Ventricle: RVSP estimated at 61 mmHg. The right ventricular size is normal. No increase in right ventricular wall thickness. Right ventricular systolic function is normal. Left Atrium: Left atrial size was severely dilated. Right Atrium: Lead in RA. Right atrial size was severely dilated. Pericardium: There is no evidence of pericardial effusion. Mitral Valve: Moderate MR by PISA and ikely functional in setting of dilated LA. The mitral valve is normal in structure. Moderate mitral valve regurgitation. No evidence of mitral valve stenosis. MV peak gradient, 12.9 mmHg. The mean mitral valve gradient is 5.0 mmHg. Tricuspid Valve: Severe TR, likely in setting of leading impingement (#71). The tricuspid valve is normal in structure. Tricuspid valve regurgitation is severe. No evidence of tricuspid stenosis. The flow in the hepatic veins is reversed during ventricular systole. Aortic Valve: The aortic valve is normal in  structure. There is mild thickening of the aortic valve. Aortic valve regurgitation is mild to moderate. No aortic stenosis is present. Pulmonic Valve: The pulmonic valve was normal in structure. Pulmonic valve regurgitation is mild. No evidence of pulmonic stenosis. Aorta: The aortic root is normal in size and structure. Venous: The inferior vena cava is dilated in size with less than 50% respiratory variability, suggesting right atrial pressure of 15 mmHg. IAS/Shunts: No atrial level shunt detected by color flow Doppler. Additional Comments: A device lead is visualized.  LEFT VENTRICLE PLAX 2D LVIDd:         4.60 cm   Diastology LVIDs:         1.90 cm   LV e' medial:    11.08 cm/s LV PW:         0.80 cm   LV E/e' medial:  11.2 LV IVS:        0.80 cm   LV e' lateral:   14.43 cm/s LVOT diam:     2.00 cm   LV E/e' lateral: 8.6 LVOT Area:     3.14 cm  RIGHT VENTRICLE RV Basal diam:  3.50 cm RV Mid diam:    3.20 cm RV S prime:     12.00 cm/s RVSP:           61.0 mmHg LEFT ATRIUM              Index        RIGHT ATRIUM           Index LA diam:        4.70 cm  2.97 cm/m   RA Pressure: 8.00 mmHg LA Vol (A2C):   127.0 ml 80.20 ml/m  RA Area:     25.80 cm LA Vol (A4C):   117.0 ml 73.89 ml/m  RA Volume:   77.90 ml  49.19 ml/m LA Biplane Vol: 123.0 ml 77.67 ml/m   AORTA Ao Root diam: 3.10 cm Ao Asc diam:  3.60 cm MITRAL VALVE                TRICUSPID VALVE MV Area (PHT): 6.12 cm     TR Peak grad:   53.0 mmHg MV Peak grad:  12.9 mmHg    TR Vmax:        364.00 cm/s MV Mean grad:  5.0 mmHg     Estimated RAP:  8.00 mmHg MV Vmax:       1.80 m/s     RVSP:           61.0 mmHg MV Vmean:  103.2 cm/s MV Decel Time: 124 msec     SHUNTS MV E velocity: 124.00 cm/s  Systemic Diam: 2.00 cm Joelle Cedars Tonleu Electronically signed by Joelle Cedars Ny Signature Date/Time: 05/08/2024/11:11:25 AM    Final    DG Chest 2 View Result Date: 05/08/2024 EXAM: 2 VIEW(S) XRAY OF THE CHEST 05/08/2024 12:36:46 AM COMPARISON: 05/17/2022  CLINICAL HISTORY: chest pain chest pain FINDINGS: LINES, TUBES AND DEVICES: Left pacer remains in place, unchanged. LUNGS AND PLEURA: Vascular congestion. No focal pulmonary opacity. No pulmonary edema. No pleural effusion. No pneumothorax. HEART AND MEDIASTINUM: Cardiomegaly. Aortic atherosclerosis. BONES AND SOFT TISSUES: No acute osseous abnormality. IMPRESSION: 1. Cardiomegaly with vascular congestion. 2. Aortic atherosclerosis. 3. Left pacer in place, unchanged. Electronically signed by: Franky Crease MD 05/08/2024 12:51 AM EST RP Workstation: [id number]     Assessment and Plan:  Atypical chest pain Chest pain started last night after experiencing whole body aches.  Chest pain worse with inspiration and coughing, worse with palpation. Patient is currently experiencing chest pain ECG without evidence of ischemia Negative troponin Echo this admission without RWMA. LHC in 2018 with minimal CAD.  She did not find relief with a GI cocktail.  Her chest pain sounds more pleuritic/MSK in nature. I do find it interesting that she did find some improvement in her chest pain with an aspirin  load, most likely from the anti-inflammatory properties of the medication.  At this time I do not suspect ACS; additionally patient said that an ischemic evaluation most likely does not align with her GOC.  Could consider Voltaren gel though will defer to primary. US  DVT being obtained by primary team, if positive could consider PE as she has had some hypoxia though denied shortness of breath. Tachycardia only in response to hypotension in the ED.  Stop IV heparin  and convert back to Xarelto  From a cardiac perspective patient can have a diet  Moderate MR Severe TR Mild to moderate AR Patient has had known valvular disease, though echocardiogram this admission does show progression. I do not suspect that her valvular disease is contributing to her current admission. She did share that she would like to limit  procedures as much as possible, and would most likely not be interested in a TEE.  Will speak with Dr. Sheena.SABRA  HFimEF [2018: EF 30-35% -> 2025: 50-55%] CXR with evidence of volume overload BNP 1033.  Receiving IV Lasix  40 mg twice daily Net IO Since Admission: No IO data has been entered for this period [05/08/24 1340] On exam, mostly euvolemic, though appears to have some JVD  Did not have increased urine output with Lasix  given this admission, and most recent blood pressures in the last 24 hours have been low normal. Would not increase dose with BP, would continue evening dose then convert back to PTA lasix  daily pending morning assessment.   Continue digoxin  as below Continue metoprolol  tartrate 100 mg twice daily twice daily  Tachy- brady syndrome s/p pacemaker  Persistent versus permanent A-fib Currently A-fib rate-controlled CHA2DS2-VASc 7 PPM estimated longevity ~1 year.  Continue digoxin  0.0625 mg, digoxin  level <0.6 Continue metoprolol  Stop IV heparin , restart PTA Xarelto  15 mg  Hyperlipidemia 2025 LDL 71   HDL 54 Continue pravastatin  20 mg  Primary Leukocytosis Gout CKD History of CVA  Risk Assessment/Risk Scores:      CHA2DS2-VASc Score = 7   This indicates a 11.2% annual risk of stroke. The patient's score is based upon: CHF History: 1 HTN History: 1 Diabetes History: 0  Stroke History: 2 Vascular Disease History: 0 Age Score: 2 Gender Score: 1     For questions or updates, please contact [facility] Please consult [email or web address] for contact info under   Signed, Leontine LOISE Salen, PA-C  05/08/2024 1:40 PM Patient seen and examined, note reviewed with the signed Advanced Practice Provider. I personally reviewed laboratory data, imaging studies and relevant notes. I independently examined the patient and formulated the important aspects of the plan. I have personally discussed the plan with the patient and/or family. Comments or changes to the  note/plan are indicated below.  Patient seen examined at bedside.  Atypical chest pain suspected to be musculoskeletal due to is reproducible nature. Valvular heart disease: Moderate mod regurgitation, severe tricuspid regurgitation, mild to moderate aortic regurgitation Heart failure with improved ejection fraction Tachybradycardia syndrome status post pacemaker Permanent atrial fibrillation  Clinically her symptoms does not appear to be angina.  On physical exam her chest pain is reproducible pinpoint which highly consistent with musculoskeletal pain.  She would benefit from local analgesic/anti-inflammatory medication.  With this we will not be pursuing an ischemic evaluation at this time we will continue to monitor and reassess if clinical picture changes.  The echocardiogram does not show any evidence of wall motion abnormalities.  No evidence of valvular heart disease which is not new.  Clinically she does not appear to be in heart failure so we will monitor closely.  I think by tomorrow morning should be ready to be transition to oral diuretics.  Will continue to reassess and monitor.   She is rate controlled in terms of her atrial fibrillation.  Continue current dose of digoxin  and metoprolol .  Will recommend restarting her Xarelto .  Hyperlipidemia - continue with current statin medication.  Chronic kidney disease avoid nephrotoxin. History of CVA on statins.  On Xarelto  for A-fib    Kerrington Greenhalgh DO, MS [facility] Attending Cardiologist [facility]  [address] [place], [place] [address] [phone] Website: [email or web address]

## 2024-05-08 NOTE — Progress Notes (Signed)
 PROGRESS NOTE    Tracy Vasquez  FMW:[id number] DOB: July 13, 1929 DOA: 05/07/2024 PCP: Sherre Clapper, MD    Brief Narrative:    89 y.o. female with history of permanent A-fib, complete heart block status post pacemaker placement, cardiomyopathy last EF measured was 30 to 35% in 2018, chronic kidney disease stage III, history of gout, prior stroke, hyperlipidemia was brought to the ER after patient was complaining of chest pain.   Assessment & Plan:   Chest pain - Combination of typical and atypical feature.  Cardiology consulted.  Troponins are flat, EKG unremarkable. Check ESR, CRP. Will add GI cocktail.  Echo showing preserved EF with significant right-sided volume overload. -No evidence of tachypnea or hypoxia to suggest PE?SABRA  Patient is already on Xarelto  and reports of compliance, currently on heparin  drip. Avoiding CTA chest in the setting of renal dysfunction. Will order LE dopplers.    Mild Acute Congestive heart failure with reduced EF 35% History of complete heart block status post pacemaker - Currently on IV Lasix , digoxin .   Leukocytosis - No clear source of infection.  Continue to monitor. Check UA  Permanent atrial fibrillation - Currently on digoxin , metoprolol .  Continue Xarelto  once okay with cardiology, in the meantime on heparin  drip    History of gout - Continue home meds  CKD stage IIIb -Creatinine 1.7  History of CVA Hyperlipidemia - Statin   DVT prophylaxis: Hep Drip      Code Status: Limited: Do not attempt resuscitation (DNR) -DNR-LIMITED -Do Not Intubate/DNI  Family Communication:   Status is: Observation The patient remains OBS appropriate and will d/c before 2 midnights.On going eval for chest pain   PT Follow up Recs:   Subjective: Seen at bedside, tells me that whenever she takes a deep breath she has some shortness chest pain.  Denies any orthopnea or other complaints   Examination:  General exam: Appears calm and comfortable   Respiratory system: Minimal bibasilar crackles Cardiovascular system: S1 & S2 heard, RRR. No JVD, murmurs, rubs, gallops or clicks. No pedal edema. Gastrointestinal system: Abdomen is nondistended, soft and nontender. No organomegaly or masses felt. Normal bowel sounds heard. Central nervous system: Alert and oriented. No focal neurological deficits. Extremities: Symmetric 5 x 5 power. Skin: No rashes, lesions or ulcers Psychiatry: Judgement and insight appear normal. Mood & affect appropriate.                Diet Orders (From admission, onward)     Start     Ordered   05/08/24 0511  Diet Heart Room service appropriate? Yes; Fluid consistency: Thin  Diet effective now       Question Answer Comment  Room service appropriate? Yes   Fluid consistency: Thin      05/08/24 0511            Objective: Vitals:   05/08/24 0600 05/08/24 0751 05/08/24 0800 05/08/24 1130  BP: 101/80  111/80 107/65  Pulse: 73  83 80  Resp: 20  20 (!) 25  Temp:  98.9 F (37.2 C)    TempSrc:  Oral    SpO2: 94%  92% 91%  Weight:      Height:       No intake or output data in the 24 hours ending 05/08/24 1249 Filed Weights   05/07/24 2235  Weight: 56.7 kg    Scheduled Meds:  digoxin   0.0625 mg Oral QODAY   febuxostat   20 mg Oral QPC supper   furosemide   40  mg Intravenous BID   metoprolol  tartrate  100 mg Oral BID   pravastatin   20 mg Oral QPC supper   sodium chloride  flush  3 mL Intravenous Once   Continuous Infusions:  calcium gluconate     heparin  750 Units/hr (05/08/24 9364)    Nutritional status     Body mass index is 22.14 kg/m.  Data Reviewed:   CBC: Recent Labs  Lab 05/07/24 2254 05/07/24 2258 05/08/24 0542  WBC 18.4*  --  20.3*  NEUTROABS  --   --  15.3*  HGB 12.2 12.9 10.9*  HCT 38.8 38.0 34.6*  MCV 92.8  --  92.8  PLT 242  --  232   Basic Metabolic Panel: Recent Labs  Lab 05/07/24 2254 05/07/24 2258 05/08/24 0542  NA 135 136 136  K 4.7 4.5 4.1   CL 96* 99 98  CO2 27  --  24  GLUCOSE 114* 117* 104*  BUN 37* 39* 37*  CREATININE 1.62* 1.70* 1.71*  CALCIUM 9.1  --  9.0   GFR: Estimated Creatinine Clearance: 17.4 mL/min (A) (by C-G formula based on SCr of 1.71 mg/dL (H)). Liver Function Tests: Recent Labs  Lab 05/08/24 0542  AST 17  ALT 10  ALKPHOS 68  BILITOT 1.0  PROT 6.7  ALBUMIN 2.9*   No results for input(s): LIPASE, AMYLASE in the last 168 hours. No results for input(s): AMMONIA in the last 168 hours. Coagulation Profile: Recent Labs  Lab 05/07/24 2254  INR 1.4*   Cardiac Enzymes: No results for input(s): CKTOTAL, CKMB, CKMBINDEX, TROPONINI in the last 168 hours. BNP (last 3 results) No results for input(s): PROBNP in the last 8760 hours. HbA1C: No results for input(s): HGBA1C in the last 72 hours. CBG: No results for input(s): GLUCAP in the last 168 hours. Lipid Profile: No results for input(s): CHOL, HDL, LDLCALC, TRIG, CHOLHDL, LDLDIRECT in the last 72 hours. Thyroid Function Tests: No results for input(s): TSH, T4TOTAL, FREET4, T3FREE, THYROIDAB in the last 72 hours. Anemia Panel: No results for input(s): VITAMINB12, FOLATE, FERRITIN, TIBC, IRON, RETICCTPCT in the last 72 hours. Sepsis Labs: No results for input(s): PROCALCITON, LATICACIDVEN in the last 168 hours.  No results found for this or any previous visit (from the past 240 hours).       Radiology Studies: ECHOCARDIOGRAM COMPLETE Result Date: 05/08/2024    ECHOCARDIOGRAM REPORT   Patient Name:   Tracy Vasquez Date of Exam: 05/08/2024 Medical Rec #:  [id number]     Height:       63.0 in Accession #:    [id number]    Weight:       125.0 lb Date of Birth:  [birth year]     BSA:          1.584 m Patient Age:    89 years      BP:           101/80 mmHg Patient Gender: F             HR:           87 bpm. Exam Location:  Inpatient Procedure: 2D Echo (Both Spectral and Color Flow Doppler were  utilized during            procedure). Indications:    chest pain  History:        Patient has prior history of Echocardiogram examinations. CHF;                 Arrythmias:Atrial Fibrillation.  Sonographer:    Charmaine Gaskins Referring Phys: 20 ARSHAD N KAKRAKANDY IMPRESSIONS  1. Left ventricular ejection fraction, by estimation, is 50 to 55%. The left ventricle has low normal function. The left ventricle has no regional wall motion abnormalities. Left ventricular diastolic function could not be evaluated. There is the interventricular septum is flattened in diastole ('D' shaped left ventricle), consistent with right ventricular volume overload.  2. RVSP estimated at 61 mmHg. Right ventricular systolic function is normal. The right ventricular size is normal.  3. Left atrial size was severely dilated.  4. Lead in RA. Right atrial size was severely dilated.  5. Moderate MR by PISA and ikely functional in setting of dilated LA.SABRA The mitral valve is normal in structure. Moderate mitral valve regurgitation. No evidence of mitral stenosis.  6. Severe TR, likely in setting of leading impingement (#71). Tricuspid valve regurgitation is severe.  7. The aortic valve is normal in structure. There is mild thickening of the aortic valve. Aortic valve regurgitation is mild to moderate. No aortic stenosis is present.  8. The inferior vena cava is dilated in size with <50% respiratory variability, suggesting right atrial pressure of 15 mmHg. Conclusion(s)/Recommendation(s): Recommend TEE to assess both TR and MR. FINDINGS  Left Ventricle: Left ventricular ejection fraction, by estimation, is 50 to 55%. The left ventricle has low normal function. The left ventricle has no regional wall motion abnormalities. The left ventricular internal cavity size was normal in size. There is no left ventricular hypertrophy. The interventricular septum is flattened in diastole ('D' shaped left ventricle), consistent with right ventricular volume  overload. Left ventricular diastolic function could not be evaluated due to atrial fibrillation. Left ventricular diastolic function could not be evaluated. Right Ventricle: RVSP estimated at 61 mmHg. The right ventricular size is normal. No increase in right ventricular wall thickness. Right ventricular systolic function is normal. Left Atrium: Left atrial size was severely dilated. Right Atrium: Lead in RA. Right atrial size was severely dilated. Pericardium: There is no evidence of pericardial effusion. Mitral Valve: Moderate MR by PISA and ikely functional in setting of dilated LA. The mitral valve is normal in structure. Moderate mitral valve regurgitation. No evidence of mitral valve stenosis. MV peak gradient, 12.9 mmHg. The mean mitral valve gradient is 5.0 mmHg. Tricuspid Valve: Severe TR, likely in setting of leading impingement (#71). The tricuspid valve is normal in structure. Tricuspid valve regurgitation is severe. No evidence of tricuspid stenosis. The flow in the hepatic veins is reversed during ventricular systole. Aortic Valve: The aortic valve is normal in structure. There is mild thickening of the aortic valve. Aortic valve regurgitation is mild to moderate. No aortic stenosis is present. Pulmonic Valve: The pulmonic valve was normal in structure. Pulmonic valve regurgitation is mild. No evidence of pulmonic stenosis. Aorta: The aortic root is normal in size and structure. Venous: The inferior vena cava is dilated in size with less than 50% respiratory variability, suggesting right atrial pressure of 15 mmHg. IAS/Shunts: No atrial level shunt detected by color flow Doppler. Additional Comments: A device lead is visualized.  LEFT VENTRICLE PLAX 2D LVIDd:         4.60 cm   Diastology LVIDs:         1.90 cm   LV e' medial:    11.08 cm/s LV PW:         0.80 cm   LV E/e' medial:  11.2 LV IVS:        0.80 cm  LV e' lateral:   14.43 cm/s LVOT diam:     2.00 cm   LV E/e' lateral: 8.6 LVOT Area:     3.14  cm  RIGHT VENTRICLE RV Basal diam:  3.50 cm RV Mid diam:    3.20 cm RV S prime:     12.00 cm/s RVSP:           61.0 mmHg LEFT ATRIUM              Index        RIGHT ATRIUM           Index LA diam:        4.70 cm  2.97 cm/m   RA Pressure: 8.00 mmHg LA Vol (A2C):   127.0 ml 80.20 ml/m  RA Area:     25.80 cm LA Vol (A4C):   117.0 ml 73.89 ml/m  RA Volume:   77.90 ml  49.19 ml/m LA Biplane Vol: 123.0 ml 77.67 ml/m   AORTA Ao Root diam: 3.10 cm Ao Asc diam:  3.60 cm MITRAL VALVE                TRICUSPID VALVE MV Area (PHT): 6.12 cm     TR Peak grad:   53.0 mmHg MV Peak grad:  12.9 mmHg    TR Vmax:        364.00 cm/s MV Mean grad:  5.0 mmHg     Estimated RAP:  8.00 mmHg MV Vmax:       1.80 m/s     RVSP:           61.0 mmHg MV Vmean:      103.2 cm/s MV Decel Time: 124 msec     SHUNTS MV E velocity: 124.00 cm/s  Systemic Diam: 2.00 cm Joelle Azobou Tonleu Electronically signed by Joelle Cedars Tonleu Signature Date/Time: 05/08/2024/11:11:25 AM    Final    DG Chest 2 View Result Date: 05/08/2024 EXAM: 2 VIEW(S) XRAY OF THE CHEST 05/08/2024 12:36:46 AM COMPARISON: 05/17/2022 CLINICAL HISTORY: chest pain chest pain FINDINGS: LINES, TUBES AND DEVICES: Left pacer remains in place, unchanged. LUNGS AND PLEURA: Vascular congestion. No focal pulmonary opacity. No pulmonary edema. No pleural effusion. No pneumothorax. HEART AND MEDIASTINUM: Cardiomegaly. Aortic atherosclerosis. BONES AND SOFT TISSUES: No acute osseous abnormality. IMPRESSION: 1. Cardiomegaly with vascular congestion. 2. Aortic atherosclerosis. 3. Left pacer in place, unchanged. Electronically signed by: Franky Crease MD 05/08/2024 12:51 AM EST RP Workstation: [id number]           LOS: 0 days   Time spent= 35 mins    Burgess JAYSON Dare, MD [facility]  If 7PM-7AM, please contact night-coverage  05/08/2024, 12:49 PM

## 2024-05-08 NOTE — Hospital Course (Addendum)
 Brief Narrative:    89 y.o. female with history of permanent A-fib, complete heart block status post pacemaker placement, cardiomyopathy last EF measured was 30 to 35% in 2018, chronic kidney disease stage III, history of gout, prior stroke, hyperlipidemia was brought to the ER after patient was complaining of chest pain.   Assessment & Plan:   Chest pain - Combination of typical and atypical feature.  Cardiology consulted.  Troponins are flat, EKG unremarkable. Check ESR, CRP. Will add GI cocktail.  Echo showing preserved EF with significant right-sided volume overload. -No evidence of tachypnea or hypoxia to suggest PE?SABRA  Patient is already on Xarelto  and reports of compliance, currently on heparin  drip. Avoiding CTA chest in the setting of renal dysfunction. Will order LE dopplers.   Mild Acute Congestive heart failure with reduced EF 35% History of complete heart block status post pacemaker - Currently on IV Lasix , digoxin .  Leukocytosis - No clear source of infection.  Continue to monitor. Check UA  Permanent atrial fibrillation - Currently on digoxin , metoprolol .  Continue Xarelto  once okay with cardiology, in the meantime on heparin  drip  History of gout - Continue home meds  CKD stage IIIb -Creatinine 1.7  History of CVA Hyperlipidemia - Statin   DVT prophylaxis: Hep Drip    Code Status: Limited: Do not attempt resuscitation (DNR) -DNR-LIMITED -Do Not Intubate/DNI  Family Communication:   Status is: Observation The patient remains OBS appropriate and will d/c before 2 midnights.On going eval for chest pain   PT Follow up Recs:   Subjective: Seen at bedside, tells me that whenever she takes a deep breath she has some shortness chest pain.  Denies any orthopnea or other complaints   Examination:  General exam: Appears calm and comfortable  Respiratory system: Minimal bibasilar crackles Cardiovascular system: S1 & S2 heard, RRR. No JVD, murmurs, rubs, gallops or  clicks. No pedal edema. Gastrointestinal system: Abdomen is nondistended, soft and nontender. No organomegaly or masses felt. Normal bowel sounds heard. Central nervous system: Alert and oriented. No focal neurological deficits. Extremities: Symmetric 5 x 5 power. Skin: No rashes, lesions or ulcers Psychiatry: Judgement and insight appear normal. Mood & affect appropriate.

## 2024-05-09 ENCOUNTER — Other Ambulatory Visit (HOSPITAL_COMMUNITY): Payer: Self-pay

## 2024-05-09 ENCOUNTER — Inpatient Hospital Stay (HOSPITAL_COMMUNITY)

## 2024-05-09 ENCOUNTER — Telehealth (HOSPITAL_COMMUNITY): Payer: Self-pay | Admitting: Pharmacy Technician

## 2024-05-09 DIAGNOSIS — R0789 Other chest pain: Secondary | ICD-10-CM | POA: Diagnosis not present

## 2024-05-09 DIAGNOSIS — R071 Chest pain on breathing: Secondary | ICD-10-CM

## 2024-05-09 LAB — CBC
HCT: 36.1 % (ref 36.0–46.0)
Hemoglobin: 11.6 g/dL — ABNORMAL LOW (ref 12.0–15.0)
MCH: 29.1 pg (ref 26.0–34.0)
MCHC: 32.1 g/dL (ref 30.0–36.0)
MCV: 90.5 fL (ref 80.0–100.0)
Platelets: 229 K/uL (ref 150–400)
RBC: 3.99 MIL/uL (ref 3.87–5.11)
RDW: 13.9 % (ref 11.5–15.5)
WBC: 21.8 K/uL — ABNORMAL HIGH (ref 4.0–10.5)
nRBC: 0 % (ref 0.0–0.2)

## 2024-05-09 LAB — URINALYSIS, ROUTINE W REFLEX MICROSCOPIC
Bilirubin Urine: NEGATIVE
Glucose, UA: NEGATIVE mg/dL
Ketones, ur: NEGATIVE mg/dL
Nitrite: NEGATIVE
Protein, ur: 30 mg/dL — AB
Specific Gravity, Urine: 1.013 (ref 1.005–1.030)
WBC, UA: >50 WBC/hpf (ref 0–5)
pH: 5 (ref 5.0–8.0)

## 2024-05-09 LAB — BASIC METABOLIC PANEL WITH GFR
Anion gap: 14 (ref 5–15)
BUN: 34 mg/dL — ABNORMAL HIGH (ref 8–23)
CO2: 28 mmol/L (ref 22–32)
Calcium: 9.4 mg/dL (ref 8.9–10.3)
Chloride: 95 mmol/L — ABNORMAL LOW (ref 98–111)
Creatinine, Ser: 1.65 mg/dL — ABNORMAL HIGH (ref 0.44–1.00)
GFR, Estimated: 29 mL/min — ABNORMAL LOW (ref >60)
Glucose, Bld: 114 mg/dL — ABNORMAL HIGH (ref 70–99)
Potassium: 3.6 mmol/L (ref 3.5–5.1)
Sodium: 137 mmol/L (ref 135–145)

## 2024-05-09 LAB — PHOSPHORUS: Phosphorus: 3.2 mg/dL (ref 2.5–4.6)

## 2024-05-09 LAB — MAGNESIUM: Magnesium: 2.2 mg/dL (ref 1.7–2.4)

## 2024-05-09 MED ORDER — CEPHALEXIN 750 MG PO CAPS
750.0000 mg | ORAL_CAPSULE | Freq: Three times a day (TID) | ORAL | 0 refills | Status: DC
  Filled 2024-05-09: qty 12, 4d supply, fill #0

## 2024-05-09 MED ORDER — POTASSIUM CHLORIDE CRYS ER 20 MEQ PO TBCR
40.0000 meq | EXTENDED_RELEASE_TABLET | Freq: Once | ORAL | Status: AC
  Administered 2024-05-09: 40 meq via ORAL
  Filled 2024-05-09: qty 2

## 2024-05-09 MED ORDER — CEPHALEXIN 500 MG PO CAPS
500.0000 mg | ORAL_CAPSULE | Freq: Three times a day (TID) | ORAL | 0 refills | Status: AC
  Filled 2024-05-09: qty 12, 4d supply, fill #0

## 2024-05-09 MED ORDER — SODIUM CHLORIDE 0.9 % IV SOLN
1.0000 g | INTRAVENOUS | Status: DC
  Administered 2024-05-09: 1 g via INTRAVENOUS
  Filled 2024-05-09: qty 10

## 2024-05-09 NOTE — Evaluation (Signed)
 Physical Therapy Evaluation Patient Details Name: Tracy Vasquez MRN: [id number] DOB: 05/30/1930 Today's Date: 05/09/2024  History of Present Illness  89 yo female presents with CP.  PMH HTN persistent afib on anticoagulation, tachycardia bradycardia with PPM LBBB hx of CVA osteoporosis gout CKD III  Clinical Impression  Pt admitted with above diagnosis. PTA pt lived at [facility], mod I with/without cane, driving. Pt currently with functional limitations due to the deficits listed below (see PT Problem List). On eval, she demo mod I bed mobility. CGA transfers and amb 250' with cane. LOB x 1 with initial stance, pt self correcting. Pt without c/o pain. Pt will benefit from acute skilled PT to increase their independence and safety with mobility to allow discharge. PT to follow acutely. No follow up services indicated.         If plan is discharge home, recommend the following:     Can travel by private vehicle        Equipment Recommendations None recommended by PT  Recommendations for Other Services       Functional Status Assessment Patient has had a recent decline in their functional status and demonstrates the ability to make significant improvements in function in a reasonable and predictable amount of time.     Precautions / Restrictions Precautions Precautions: Fall Recall of Precautions/Restrictions: Intact      Mobility  Bed Mobility Overal bed mobility: Modified Independent             General bed mobility comments: using bed functions    Transfers Overall transfer level: Needs assistance Equipment used: Ambulation equipment used Transfers: Sit to/from Stand Sit to Stand: Contact guard assist           General transfer comment: LOB with initial stand, pt able to self correct    Ambulation/Gait Ambulation/Gait assistance: Contact guard assist Gait Distance (Feet): 250 Feet Assistive device: Straight cane Gait Pattern/deviations: Step-through  pattern, Decreased stride length Gait velocity: decreased Gait velocity interpretation: 1.31 - 2.62 ft/sec, indicative of limited community ambulator   General Gait Details: Pt reporting fatigue after gait.  Stairs            Wheelchair Mobility     Tilt Bed    Modified Rankin (Stroke Patients Only)       Balance Overall balance assessment: Needs assistance Sitting-balance support: No upper extremity supported, Feet supported Sitting balance-Leahy Scale: Good     Standing balance support: Single extremity supported, No upper extremity supported, During functional activity Standing balance-Leahy Scale: Fair                               Pertinent Vitals/Pain Pain Assessment Pain Assessment: No/denies pain    Home Living Family/patient expects to be discharged to:: Private residence Living Arrangements: Alone Available Help at Discharge: Family;Available PRN/intermittently Type of Home: Independent living facility ([facility]) Home Access: Level entry       Home Layout: One level Home Equipment: Cane - single point;Grab bars - tub/shower;Shower seat - built in;Hand held shower head;Grab bars - toilet      Prior Function Prior Level of Function : Independent/Modified Independent;Driving             Mobility Comments: cane at baseline ADLs Comments: drives, does her own shopping     Extremity/Trunk Assessment   Upper Extremity Assessment Upper Extremity Assessment: Defer to OT evaluation    Lower Extremity Assessment Lower Extremity  Assessment: Overall WFL for tasks assessed    Cervical / Trunk Assessment Cervical / Trunk Assessment: Kyphotic  Communication   Communication Communication: Impaired Factors Affecting Communication: Hearing impaired    Cognition Arousal: Alert Behavior During Therapy: WFL for tasks assessed/performed   PT - Cognitive impairments: No apparent impairments                                  Cueing       General Comments General comments (skin integrity, edema, etc.): VSS on RA    Exercises     Assessment/Plan    PT Assessment Patient needs continued PT services  PT Problem List Decreased activity tolerance;Decreased mobility;Decreased balance       PT Treatment Interventions Therapeutic exercise;Gait training;Balance training;Functional mobility training;Therapeutic activities;Patient/family education    PT Goals (Current goals can be found in the Care Plan section)  Acute Rehab PT Goals Patient Stated Goal: return to apartment PT Goal Formulation: With patient Time For Goal Achievement: 05/23/24 Potential to Achieve Goals: Good    Frequency Min 2X/week     Co-evaluation               AM-PAC PT 6 Clicks Mobility  Outcome Measure Help needed turning from your back to your side while in a flat bed without using bedrails?: None Help needed moving from lying on your back to sitting on the side of a flat bed without using bedrails?: None Help needed moving to and from a bed to a chair (including a wheelchair)?: A Little Help needed standing up from a chair using your arms (e.g., wheelchair or bedside chair)?: A Little Help needed to walk in hospital room?: A Little Help needed climbing 3-5 steps with a railing? : A Little 6 Click Score: 20    End of Session Equipment Utilized During Treatment: Gait belt Activity Tolerance: Patient tolerated treatment well Patient left: in bed;with call bell/phone within reach;with bed alarm set Nurse Communication: Mobility status PT Visit Diagnosis: Unsteadiness on feet (R26.81)    Time: 9250-9197 PT Time Calculation (min) (ACUTE ONLY): 13 min   Charges:   PT Evaluation $PT Eval Low Complexity: 1 Low   PT General Charges $$ ACUTE PT VISIT: 1 Visit         Sari MATSU., PT  Office # [phone]   Erven Sari Shaker 05/09/2024, 8:58 AM

## 2024-05-09 NOTE — Progress Notes (Addendum)
 Progress Note  Patient Name: Charlene Detter Date of Encounter: 05/09/2024 [facility] Cardiologist: Cathlyn Birmingham MD  Interval Summary   Reports feeling much better. Currently chest pain free. Remembers that about 25 years ago she had a similar instance and it was due to inflammation of the muscles in her rib cage and suspects that her chest pain was that.  Vital Signs Vitals:   05/09/24 0435 05/09/24 0435 05/09/24 0435 05/09/24 0922  BP: 96/82 116/62 116/62 (!) 106/49  Pulse: 97 98 98 85  Resp: 16 16 16 17   Temp: 97.6 F (36.4 C) 97.6 F (36.4 C) 97.6 F (36.4 C) 97.6 F (36.4 C)  TempSrc:    Oral  SpO2: 94% 93% 93% 98%  Weight:      Height:        Intake/Output Summary (Last 24 hours) at 05/09/2024 1043 Last data filed at 05/08/2024 1724 Gross per 24 hour  Intake 400.81 ml  Output --  Net 400.81 ml      05/08/2024    4:00 PM 05/07/2024   10:35 PM 03/17/2024    1:55 PM  Last 3 Weights  Weight (lbs) 122 lb 9.2 oz 125 lb 127 lb 4.8 oz  Weight (kg) 55.6 kg 56.7 kg 57.743 kg      Telemetry/ECG  AF rate controlled majority of the time ~ 85-> did have some HR rise most likely related to ambulation.  - Personally Reviewed  Physical Exam  GEN: No acute distress.   Neck: No JVD Cardiac:Irregularly, irregular, no murmurs, rubs, or gallops. Radial pulses 2+ Respiratory: Clear to auscultation bilaterally. GI: Soft, nontender, non-distended  MS: No edema  Assessment & Plan  Mateya Torti is a 89 y.o. female with a hx of hypertension, persistent atrial fibrillation on anticoagulation, Tachy-brady syndrome s/p PPM with GEN change in 2019, non-ischemic cardiomyopathy, LBBB, and hx of CVA who presented to the ED on 11/2 for chest pain. ECG showed no evidence of acute ischemia. Troponin negative. BNP 1033. Received GI cocktail, PO and IV Lasix , started on IV heparin . Cardiology consulted.    HFimEF [2018: EF 30-35% -> 2025: 50-55%] CXR with evidence of volume  overload BNP 1033.  Received IV diuresis in ED. I/Os were not accurately obtained. Weight down 3lbs On exam, appears euvolemic    Continue digoxin  as below Continue metoprolol  tartrate 100 mg twice daily twice daily Restart home diuretic at discharge  Atypical chest pain Chest pain worse with inspiration and coughing, worse with palpation on exam. Based on results and symptomatology most likely MSK in etiology. Do not suspect ACS.  Patient is currently chest pain free.  Continue Voltaren gel as needed   Moderate MR Severe TR Mild to moderate AR Continue to monitor outpatient   Tachy- brady syndrome s/p pacemaker  Persistent versus permanent A-fib Currently mostly rate controlled AF CHA2DS2-VASc 7 PPM estimated longevity ~1 year.   Continue digoxin  0.0625 mg, digoxin  level <0.6 Continue metoprolol  Continue Xarelto  15 mg   Hyperlipidemia 2025 LDL 71   HDL 54 Continue pravastatin  20 mg   Primary Leukocytosis Gout CKD History of CVA    For questions or updates, please contact [facility] Please consult [email or web address] for contact info under       Signed, Leontine LOISE Garrick DEVONNA '  Patient seen and examined, note reviewed with the signed Advanced Practice Provider. I personally reviewed laboratory data, imaging studies and relevant notes. I independently examined the patient and formulated the important aspects of the plan.  I have personally discussed the plan with the patient and/or family. Comments or changes to the note/plan are indicated below.  Atypical chest pain which has resolved.  She is very happy.   Heart disease: Moderate mod regurgitation, severe tricuspid regurgitation, mild to moderate AR-well compensated does not appear to be in heart failure.  Continue current treatment for atrial fibrillation continue digoxin , metoprolol  and Xarelto .  Hyperlipidemia - continue with current statin medication.  Cardiovascular standpoint she can be discharged  to home.   Dellia Donnelly DO, MS [facility] Attending Cardiologist [facility]  [address] [place], [place] [address] [phone] Website: [email or web address]

## 2024-05-09 NOTE — Plan of Care (Signed)

## 2024-05-09 NOTE — Telephone Encounter (Signed)
 Patient Product/process development scientist completed.    The patient is insured through [facility]. Patient has Medicare and is not eligible for a copay card, but may be able to apply for patient assistance or Medicare RX Payment Plan (Patient Must reach out to their plan, if eligible for payment plan), if available.    Ran test claim for Eliquis 5 mg and the current 30 day co-pay is $0.00.   This test claim was processed through [facility]- copay amounts may vary at other pharmacies due to pharmacy/plan contracts, or as the patient moves through the different stages of their insurance plan.     Reyes Sharps, CPHT Pharmacy Technician Patient Advocate Specialist Lead [facility] Patient Advocate Team Direct Number: [phone]  Fax: [phone]

## 2024-05-09 NOTE — Progress Notes (Signed)
 Heart Failure Navigator Progress Note  Assessed for Heart & Vascular TOC clinic readiness.  Patient does not meet criteria due to . clinically not in HF per cards, EF 55-60%. No HF TOC.   Navigator will sign off at this time.   Stephane Haddock, BSN, Scientist, Clinical (histocompatibility And Immunogenetics) Only

## 2024-05-09 NOTE — Discharge Summary (Signed)
 Physician Discharge Summary  Tracy Vasquez FMW:[id number] DOB: [birth year] DOA: 05/07/2024  PCP: Sherre Clapper, MD  Admit date: 05/07/2024 Discharge date: 05/09/2024  Admitted From: Home Disposition: Home  Recommendations for Outpatient Follow-up:  Follow up with PCP in 1-2 weeks Please obtain BMP/CBC in one week your next doctors visit.  Outpatient follow-up with cardiology Resume home medications 4 more days of p.o. Keflex    Discharge Condition: Stable CODE STATUS DNR Diet recommendation: Heart healthy  Brief/Interim Summary: Brief Narrative:    89 y.o. female with history of permanent A-fib, complete heart block status post pacemaker placement, cardiomyopathy last EF measured was 30 to 35% in 2018, chronic kidney disease stage III, history of gout, prior stroke, hyperlipidemia was brought to the ER after patient was complaining of chest pain.  During the hospitalization patient was gently diuresed and eventually transition back to her home regimen.  Chest pain appeared to be musculoskeletal and there was no concerns of ACS.  Patient was seen by cardiology.  Echocardiogram did show elevated pressures on the right side.  Eventually cleared for discharge with outpatient follow-up. Due to persistent leukocytosis she was noted to have UTI therefore received 1 dose of IV Rocephin  in the hospital and will be discharged on 4 more days of p.o. Keflex .   Assessment & Plan:   Chest pain - Suspect likely musculoskeletal in nature.  No suspicion for ACS.  Seen by cardiology team. Low suspicion for thromboembolic disease given patient is already anticoagulated and admits of being compliant.  Mild Acute Congestive heart failure with reduced EF 35% History of complete heart block status post pacemaker - Feeling much better, leg swelling has resolved.  Resume home diuretics, metoprolol  and digoxin   Leukocytosis, UTI -Rocephin  started, will discharge on 4 more days of p.o. Keflex   Permanent  atrial fibrillation - Currently on digoxin , metoprolol .  Continue Xarelto , offered to switch to Eliquis given her age and renal function but patient declined  History of gout - Continue home meds  CKD stage IIIb -Creatinine 1.7  History of CVA Hyperlipidemia - Statin   DVT prophylaxis: Xarelto     Code Status: Limited: Do not attempt resuscitation (DNR) -DNR-LIMITED -Do Not Intubate/DNI  Family Communication: Family updated Discharge  PT Follow up Recs:   Subjective: Really wants to be discharged today and denies any complaints   Examination:  General exam: Appears calm and comfortable  Respiratory system: Minimal bibasilar crackles, resolved Cardiovascular system: S1 & S2 heard, RRR. No JVD, murmurs, rubs, gallops or clicks. No pedal edema. Gastrointestinal system: Abdomen is nondistended, soft and nontender. No organomegaly or masses felt. Normal bowel sounds heard. Central nervous system: Alert and oriented. No focal neurological deficits. Extremities: Symmetric 5 x 5 power. Skin: No rashes, lesions or ulcers Psychiatry: Judgement and insight appear normal. Mood & affect appropriate.    Discharge Diagnoses:  Principal Problem:   Chest pain Active Problems:   Chronic systolic CHF (congestive heart failure) (HCC)   Chronic atrial fibrillation (HCC)   Chronic kidney disease, stage 4 (severe) (HCC)   Mixed hyperlipidemia   History of gout   Leukocytosis      Discharge Exam: Vitals:   05/09/24 0435 05/09/24 0922  BP: 116/62 (!) 106/49  Pulse: 98 85  Resp: 16 17  Temp: 97.6 F (36.4 C) 97.6 F (36.4 C)  SpO2: 93% 98%   Vitals:   05/09/24 0435 05/09/24 0435 05/09/24 0435 05/09/24 0922  BP: 96/82 116/62 116/62 (!) 106/49  Pulse: 97 98 98 85  Resp:  16 16 16 17   Temp: 97.6 F (36.4 C) 97.6 F (36.4 C) 97.6 F (36.4 C) 97.6 F (36.4 C)  TempSrc:    Oral  SpO2: 94% 93% 93% 98%  Weight:      Height:          Discharge Instructions   Allergies  as of 05/09/2024   No Known Allergies      Medication List     TAKE these medications    acetaminophen  500 MG tablet Commonly known as: TYLENOL  Take 1,000 mg by mouth 2 (two) times daily. Take two tablets (1000mg ) by mouth twice a day at 0300 and 1500.   cephALEXin  500 MG capsule Commonly known as: KEFLEX  Take 1 capsule (500 mg total) by mouth 3 (three) times daily for 4 days.   Cholecalciferol 50 MCG (2000 UT) Caps Take 2,000 Units by mouth daily after supper. Take one softgel (2000iu) by mouth daily after supper   digoxin  0.125 MG tablet Commonly known as: LANOXIN  TAKE 1/2 TABLET BY MOUTH EVERY OTHER DAY What changed: additional instructions   febuxostat  40 MG tablet Commonly known as: ULORIC  TAKE 1/2 TABLET BY MOUTH DAILY What changed:  when to take this additional instructions   furosemide  20 MG tablet Commonly known as: LASIX  2 in am and 3 in pm. What changed:  how much to take how to take this when to take this additional instructions   LORazepam  0.5 MG tablet Commonly known as: ATIVAN  Take 1 tablet (0.5 mg total) by mouth at bedtime. What changed:  when to take this reasons to take this   metoprolol  tartrate 100 MG tablet Commonly known as: LOPRESSOR  Take 1 tablet (100 mg total) by mouth 2 (two) times daily.   OVER THE COUNTER MEDICATION Apply 1 Application topically daily as needed (Leg  and back pain). MagniLife Leg and Back Pain Relief Cream   pravastatin  20 MG tablet Commonly known as: PRAVACHOL  TAKE 1 TABLET(20 MG) BY MOUTH DAILY What changed: See the new instructions.   Propylene Glycol 0.6 % Soln Place 1 drop into both eyes in the morning.   Xarelto  15 MG Tabs tablet Generic drug: Rivaroxaban  TAKE ONE TABLET BY MOUTH DAILY with supper What changed: See the new instructions.        No Known Allergies  You were cared for by a hospitalist during your hospital stay. If you have any questions about your discharge medications or the  care you received while you were in the hospital after you are discharged, you can call the unit and asked to speak with the hospitalist on call if the hospitalist that took care of you is not available. Once you are discharged, your primary care physician will handle any further medical issues. Please note that no refills for any discharge medications will be authorized once you are discharged, as it is imperative that you return to your primary care physician (or establish a relationship with a primary care physician if you do not have one) for your aftercare needs so that they can reassess your need for medications and monitor your lab values.  You were cared for by a hospitalist during your hospital stay. If you have any questions about your discharge medications or the care you received while you were in the hospital after you are discharged, you can call the unit and asked to speak with the hospitalist on call if the hospitalist that took care of you is not available. Once you are discharged, your primary care  physician will handle any further medical issues. Please note that NO REFILLS for any discharge medications will be authorized once you are discharged, as it is imperative that you return to your primary care physician (or establish a relationship with a primary care physician if you do not have one) for your aftercare needs so that they can reassess your need for medications and monitor your lab values.  Please request your Prim.MD to go over all Hospital Tests and Procedure/Radiological results at the follow up, please get all Hospital records sent to your Prim MD by signing hospital release before you go home.  Get CBC, CMP, 2 view Chest X ray checked  by Primary MD during your next visit or SNF MD in 5-7 days ( we routinely change or add medications that can affect your baseline labs and fluid status, therefore we recommend that you get the mentioned basic workup next visit with your PCP, your  PCP may decide not to get them or add new tests based on their clinical decision)  On your next visit with your primary care physician please Get Medicines reviewed and adjusted.  If you experience worsening of your admission symptoms, develop shortness of breath, life threatening emergency, suicidal or homicidal thoughts you must seek medical attention immediately by calling 911 or calling your MD immediately  if symptoms less severe.  You Must read complete instructions/literature along with all the possible adverse reactions/side effects for all the Medicines you take and that have been prescribed to you. Take any new Medicines after you have completely understood and accpet all the possible adverse reactions/side effects.   Do not drive, operate heavy machinery, perform activities at heights, swimming or participation in water activities or provide baby sitting services if your were admitted for syncope or siezures until you have seen by Primary MD or a Neurologist and advised to do so again.  Do not drive when taking Pain medications.   Procedures/Studies: ECHOCARDIOGRAM COMPLETE Result Date: 05/08/2024    ECHOCARDIOGRAM REPORT   Patient Name:   CHERYLL KEISLER Date of Exam: 05/08/2024 Medical Rec #:  [id number]     Height:       63.0 in Accession #:    [id number]    Weight:       125.0 lb Date of Birth:  03-09-1930     BSA:          1.584 m Patient Age:    89 years      BP:           101/80 mmHg Patient Gender: F             HR:           87 bpm. Exam Location:  Inpatient Procedure: 2D Echo (Both Spectral and Color Flow Doppler were utilized during            procedure). Indications:    chest pain  History:        Patient has prior history of Echocardiogram examinations. CHF;                 Arrythmias:Atrial Fibrillation.  Sonographer:    Charmaine Gaskins Referring Phys: [id number] ARSHAD N KAKRAKANDY IMPRESSIONS  1. Left ventricular ejection fraction, by estimation, is 50 to 55%. The left ventricle has  low normal function. The left ventricle has no regional wall motion abnormalities. Left ventricular diastolic function could not be evaluated. There is the interventricular septum is flattened in diastole ('D' shaped left ventricle), consistent  with right ventricular volume overload.  2. RVSP estimated at 61 mmHg. Right ventricular systolic function is normal. The right ventricular size is normal.  3. Left atrial size was severely dilated.  4. Lead in RA. Right atrial size was severely dilated.  5. Moderate MR by PISA and ikely functional in setting of dilated LA.SABRA The mitral valve is normal in structure. Moderate mitral valve regurgitation. No evidence of mitral stenosis.  6. Severe TR, likely in setting of leading impingement (#71). Tricuspid valve regurgitation is severe.  7. The aortic valve is normal in structure. There is mild thickening of the aortic valve. Aortic valve regurgitation is mild to moderate. No aortic stenosis is present.  8. The inferior vena cava is dilated in size with <50% respiratory variability, suggesting right atrial pressure of 15 mmHg. Conclusion(s)/Recommendation(s): Recommend TEE to assess both TR and MR. FINDINGS  Left Ventricle: Left ventricular ejection fraction, by estimation, is 50 to 55%. The left ventricle has low normal function. The left ventricle has no regional wall motion abnormalities. The left ventricular internal cavity size was normal in size. There is no left ventricular hypertrophy. The interventricular septum is flattened in diastole ('D' shaped left ventricle), consistent with right ventricular volume overload. Left ventricular diastolic function could not be evaluated due to atrial fibrillation. Left ventricular diastolic function could not be evaluated. Right Ventricle: RVSP estimated at 61 mmHg. The right ventricular size is normal. No increase in right ventricular wall thickness. Right ventricular systolic function is normal. Left Atrium: Left atrial size was  severely dilated. Right Atrium: Lead in RA. Right atrial size was severely dilated. Pericardium: There is no evidence of pericardial effusion. Mitral Valve: Moderate MR by PISA and ikely functional in setting of dilated LA. The mitral valve is normal in structure. Moderate mitral valve regurgitation. No evidence of mitral valve stenosis. MV peak gradient, 12.9 mmHg. The mean mitral valve gradient is 5.0 mmHg. Tricuspid Valve: Severe TR, likely in setting of leading impingement (#71). The tricuspid valve is normal in structure. Tricuspid valve regurgitation is severe. No evidence of tricuspid stenosis. The flow in the hepatic veins is reversed during ventricular systole. Aortic Valve: The aortic valve is normal in structure. There is mild thickening of the aortic valve. Aortic valve regurgitation is mild to moderate. No aortic stenosis is present. Pulmonic Valve: The pulmonic valve was normal in structure. Pulmonic valve regurgitation is mild. No evidence of pulmonic stenosis. Aorta: The aortic root is normal in size and structure. Venous: The inferior vena cava is dilated in size with less than 50% respiratory variability, suggesting right atrial pressure of 15 mmHg. IAS/Shunts: No atrial level shunt detected by color flow Doppler. Additional Comments: A device lead is visualized.  LEFT VENTRICLE PLAX 2D LVIDd:         4.60 cm   Diastology LVIDs:         1.90 cm   LV e' medial:    11.08 cm/s LV PW:         0.80 cm   LV E/e' medial:  11.2 LV IVS:        0.80 cm   LV e' lateral:   14.43 cm/s LVOT diam:     2.00 cm   LV E/e' lateral: 8.6 LVOT Area:     3.14 cm  RIGHT VENTRICLE RV Basal diam:  3.50 cm RV Mid diam:    3.20 cm RV S prime:     12.00 cm/s RVSP:  61.0 mmHg LEFT ATRIUM              Index        RIGHT ATRIUM           Index LA diam:        4.70 cm  2.97 cm/m   RA Pressure: 8.00 mmHg LA Vol (A2C):   127.0 ml 80.20 ml/m  RA Area:     25.80 cm LA Vol (A4C):   117.0 ml 73.89 ml/m  RA Volume:   77.90  ml  49.19 ml/m LA Biplane Vol: 123.0 ml 77.67 ml/m   AORTA Ao Root diam: 3.10 cm Ao Asc diam:  3.60 cm MITRAL VALVE                TRICUSPID VALVE MV Area (PHT): 6.12 cm     TR Peak grad:   53.0 mmHg MV Peak grad:  12.9 mmHg    TR Vmax:        364.00 cm/s MV Mean grad:  5.0 mmHg     Estimated RAP:  8.00 mmHg MV Vmax:       1.80 m/s     RVSP:           61.0 mmHg MV Vmean:      103.2 cm/s MV Decel Time: 124 msec     SHUNTS MV E velocity: 124.00 cm/s  Systemic Diam: 2.00 cm Joelle Azobou Tonleu Electronically signed by Joelle Cedars Tonleu Signature Date/Time: 05/08/2024/11:11:25 AM    Final    DG Chest 2 View Result Date: 05/08/2024 EXAM: 2 VIEW(S) XRAY OF THE CHEST 05/08/2024 12:36:46 AM COMPARISON: 05/17/2022 CLINICAL HISTORY: chest pain chest pain FINDINGS: LINES, TUBES AND DEVICES: Left pacer remains in place, unchanged. LUNGS AND PLEURA: Vascular congestion. No focal pulmonary opacity. No pulmonary edema. No pleural effusion. No pneumothorax. HEART AND MEDIASTINUM: Cardiomegaly. Aortic atherosclerosis. BONES AND SOFT TISSUES: No acute osseous abnormality. IMPRESSION: 1. Cardiomegaly with vascular congestion. 2. Aortic atherosclerosis. 3. Left pacer in place, unchanged. Electronically signed by: Franky Crease MD 05/08/2024 12:51 AM EST RP Workstation: [id number]     The results of significant diagnostics from this hospitalization (including imaging, microbiology, ancillary and laboratory) are listed below for reference.     Microbiology: No results found for this or any previous visit (from the past 240 hours).   Labs: BNP (last 3 results) Recent Labs    05/08/24 0542  BNP 1,033.2*   Basic Metabolic Panel: Recent Labs  Lab 05/07/24 2254 05/07/24 2258 05/08/24 0542 05/09/24 0440  NA 135 136 136 137  K 4.7 4.5 4.1 3.6  CL 96* 99 98 95*  CO2 27  --  24 28  GLUCOSE 114* 117* 104* 114*  BUN 37* 39* 37* 34*  CREATININE 1.62* 1.70* 1.71* 1.65*  CALCIUM 9.1  --  9.0 9.4  MG  --   --   --   2.2  PHOS  --   --   --  3.2   Liver Function Tests: Recent Labs  Lab 05/08/24 0542  AST 17  ALT 10  ALKPHOS 68  BILITOT 1.0  PROT 6.7  ALBUMIN 2.9*   No results for input(s): LIPASE, AMYLASE in the last 168 hours. No results for input(s): AMMONIA in the last 168 hours. CBC: Recent Labs  Lab 05/07/24 2254 05/07/24 2258 05/08/24 0542 05/09/24 0440  WBC 18.4*  --  20.3* 21.8*  NEUTROABS  --   --  15.3*  --   HGB 12.2 12.9  10.9* 11.6*  HCT 38.8 38.0 34.6* 36.1  MCV 92.8  --  92.8 90.5  PLT 242  --  232 229   Cardiac Enzymes: No results for input(s): CKTOTAL, CKMB, CKMBINDEX, TROPONINI in the last 168 hours. BNP: Invalid input(s): POCBNP CBG: No results for input(s): GLUCAP in the last 168 hours. D-Dimer No results for input(s): DDIMER in the last 72 hours. Hgb A1c No results for input(s): HGBA1C in the last 72 hours. Lipid Profile No results for input(s): CHOL, HDL, LDLCALC, TRIG, CHOLHDL, LDLDIRECT in the last 72 hours. Thyroid function studies No results for input(s): TSH, T4TOTAL, T3FREE, THYROIDAB in the last 72 hours.  Invalid input(s): FREET3 Anemia work up No results for input(s): VITAMINB12, FOLATE, FERRITIN, TIBC, IRON, RETICCTPCT in the last 72 hours. Urinalysis    Component Value Date/Time   COLORURINE AMBER (A) 05/09/2024 0419   APPEARANCEUR TURBID (A) 05/09/2024 0419   APPEARANCEUR Cloudy (A) 11/16/2022 1436   LABSPEC 1.013 05/09/2024 0419   PHURINE 5.0 05/09/2024 0419   GLUCOSEU NEGATIVE 05/09/2024 0419   HGBUR LARGE (A) 05/09/2024 0419   BILIRUBINUR NEGATIVE 05/09/2024 0419   BILIRUBINUR negative 11/23/2023 1346   BILIRUBINUR Negative 11/16/2022 1436   KETONESUR NEGATIVE 05/09/2024 0419   PROTEINUR 30 (A) 05/09/2024 0419   UROBILINOGEN 0.2 11/23/2023 1346   UROBILINOGEN 1.0 08/19/2012 2055   NITRITE NEGATIVE 05/09/2024 0419   LEUKOCYTESUR LARGE (A) 05/09/2024 0419   Sepsis  Labs Recent Labs  Lab 05/07/24 2254 05/08/24 0542 05/09/24 0440  WBC 18.4* 20.3* 21.8*   Microbiology No results found for this or any previous visit (from the past 240 hours).   Time coordinating discharge:  I have spent 35 minutes face to face with the patient and on the ward discussing the patients care, assessment, plan and disposition with other care givers. >50% of the time was devoted counseling the patient about the risks and benefits of treatment/Discharge disposition and coordinating care.   SIGNED:   Burgess JAYSON Dare, MD  [facility] 05/09/2024, 11:56 AM   If 7PM-7AM, please contact night-coverage

## 2024-05-09 NOTE — Evaluation (Signed)
 Occupational Therapy Evaluation Patient Details Name: Tracy Vasquez MRN: [id number] DOB: 11-Jul-1929 Today's Date: 05/09/2024   History of Present Illness   89 yo female presents with CP.  PMH HTN persistent afib on anticoagulation, tachycardia bradycardia with PPM LBBB hx of CVA osteoporosis gout CKD III     Clinical Impressions Patient evaluated by Occupational Therapy with no further acute OT needs identified. All education has been completed and the patient has no further questions. See below for any follow-up Occupational Therapy or equipment needs. OT to sign off. Thank you for referral.       If plan is discharge home, recommend the following:         Functional Status Assessment   Patient has had a recent decline in their functional status and demonstrates the ability to make significant improvements in function in a reasonable and predictable amount of time.     Equipment Recommendations   None recommended by OT     Recommendations for Other Services         Precautions/Restrictions   Precautions Precautions: Fall Recall of Precautions/Restrictions: Intact Restrictions Weight Bearing Restrictions Per Provider Order: No     Mobility Bed Mobility Overal bed mobility: Modified Independent                  Transfers Overall transfer level: Modified independent Equipment used: Straight cane                      Balance Overall balance assessment: Mild deficits observed, not formally tested                                         ADL either performed or assessed with clinical judgement   ADL Overall ADL's : Modified independent                                       General ADL Comments: completed LB dressing. toilet transfer with hygiene and sink level grooming     Vision Baseline Vision/History: 0 No visual deficits Ability to See in Adequate Light: 0 Adequate Patient Visual Report: No  change from baseline       Perception         Praxis         Pertinent Vitals/Pain Pain Assessment Pain Assessment: No/denies pain     Extremity/Trunk Assessment Upper Extremity Assessment Upper Extremity Assessment: Defer to OT evaluation   Lower Extremity Assessment Lower Extremity Assessment: Overall WFL for tasks assessed   Cervical / Trunk Assessment Cervical / Trunk Assessment: Kyphotic   Communication Communication Communication: Impaired Factors Affecting Communication: Hearing impaired   Cognition Arousal: Alert Behavior During Therapy: WFL for tasks assessed/performed Cognition: No apparent impairments                               Following commands: Intact       Cueing  General Comments   Cueing Techniques: Verbal cues  VSS on RA   Exercises     Shoulder Instructions      Home Living Family/patient expects to be discharged to:: Private residence Living Arrangements: Alone Available Help at Discharge: Family;Available PRN/intermittently Type of Home: Independent living facility ([facility]) Home Access: Level entry  Home Layout: One level     Bathroom Shower/Tub: Producer, Television/film/video: Handicapped height Bathroom Accessibility: Yes   Home Equipment: Cane - single point;Grab bars - tub/shower;Shower seat - built in;Hand held shower head;Grab bars - toilet  Goes to exercise classes 3x per week        Prior Functioning/Environment Prior Level of Function : Independent/Modified Independent;Driving             Mobility Comments: cane at baseline ADLs Comments: drives, does her own shopping    OT Problem List:     OT Treatment/Interventions:        OT Goals(Current goals can be found in the care plan section)   Acute Rehab OT Goals Patient Stated Goal: to go home   OT Frequency:       Co-evaluation              AM-PAC OT 6 Clicks Daily Activity     Outcome Measure Help  from another person eating meals?: None Help from another person taking care of personal grooming?: None Help from another person toileting, which includes using toliet, bedpan, or urinal?: None Help from another person bathing (including washing, rinsing, drying)?: None Help from another person to put on and taking off regular upper body clothing?: None Help from another person to put on and taking off regular lower body clothing?: None 6 Click Score: 24   End of Session Nurse Communication: Mobility status;Precautions  Activity Tolerance: Patient tolerated treatment well Patient left: in bed;with call bell/phone within reach;with bed alarm set                   Time: 9154-9096 OT Time Calculation (min): 18 min Charges:  OT General Charges $OT Visit: 1 Visit   Ely, OTR/L  Acute Rehabilitation Services Office: [phone] .   [name] 05/09/2024, 9:08 AM

## 2024-05-10 ENCOUNTER — Telehealth: Payer: Self-pay

## 2024-05-10 NOTE — Transitions of Care (Post Inpatient/ED Visit) (Signed)
 05/10/2024  Name: Tracy Vasquez MRN: [id number] DOB: [birth year]  Today's TOC FU Call Status: Today's TOC FU Call Status:: Successful TOC FU Call Completed TOC FU Call Complete Date: 05/10/24 Patient's Name and Date of Birth confirmed.  Transition Care Management Follow-up Telephone Call Date of Discharge: 05/09/24 Discharge Facility: [facility]) Type of Discharge: Inpatient Admission Primary Inpatient Discharge Diagnosis:: chest pain How have you been since you were released from the hospital?: Better Any questions or concerns?: No  Items Reviewed: Did you receive and understand the discharge instructions provided?: Yes Medications obtained,verified, and reconciled?: Yes (Medications Reviewed) Any new allergies since your discharge?: No Dietary orders reviewed?: Yes Do you have support at home?: No  Medications Reviewed Today: Medications Reviewed Today     Reviewed by Emmitt Pan, LPN (Licensed Practical Nurse) on 05/10/24 at 0908  Med List Status: <None>   Medication Order Taking? Sig Documenting Provider Last Dose Status Informant  acetaminophen  (TYLENOL ) 500 MG tablet [id number] Yes Take 1,000 mg by mouth 2 (two) times daily. Take two tablets (1000mg ) by mouth twice a day at 0300 and 1500. [provider]  Active Self, Pharmacy Records  cephALEXin  (KEFLEX ) 500 MG capsule [id number] Yes Take 1 capsule (500 mg total) by mouth 3 (three) times daily for 4 days. Caleen Burgess BROCKS, MD  Active   Cholecalciferol 50 MCG (2000 UT) CAPS [id number] Yes Take 2,000 Units by mouth daily after supper. Take one softgel (2000iu) by mouth daily after supper [provider]  Active Self, Pharmacy Records  digoxin  (LANOXIN ) 0.125 MG tablet [id number] Yes TAKE 1/2 TABLET BY MOUTH EVERY OTHER DAY  Patient taking differently: Take 0.0625 mg by mouth every other day. Take one-half tablet (0.0625mg ) by mouth every other day after supper.   Waddell Danelle ORN, MD  Active Self, Pharmacy  Records  febuxostat  (ULORIC ) 40 MG tablet [id number] Yes TAKE 1/2 TABLET BY MOUTH DAILY  Patient taking differently: Take 20 mg by mouth daily after supper. Take one-half tablet (20mg ) by mouth daily after supper.   Cox, Kirsten, MD  Active Self, Pharmacy Records  furosemide  (LASIX ) 20 MG tablet [id number] Yes 2 in am and 3 in pm.  Patient taking differently: Take 40-60 mg by mouth 2 (two) times daily. Take three tablets (60mg ) by mouth in the morning,  and two tablets  (40mg ) in the evening   Cox, Kirsten, MD  Active Self, Pharmacy Records  LORazepam  (ATIVAN ) 0.5 MG tablet [id number] Yes Take 1 tablet (0.5 mg total) by mouth at bedtime.  Patient taking differently: Take 0.5 mg by mouth at bedtime as needed for sleep.   Cox, Kirsten, MD  Active Self, Pharmacy Records  metoprolol  tartrate (LOPRESSOR ) 100 MG tablet [id number] Yes Take 1 tablet (100 mg total) by mouth 2 (two) times daily. Waddell Danelle ORN, MD  Active Self, Pharmacy Records  OVER THE COUNTER MEDICATION [id number] Yes Apply 1 Application topically daily as needed (Leg  and back pain). MagniLife Leg and Back Pain Relief Cream [provider]  Active Self, Pharmacy Records  pravastatin  (PRAVACHOL ) 20 MG tablet [id number] Yes TAKE 1 TABLET(20 MG) BY MOUTH DAILY  Patient taking differently: Take 20 mg by mouth daily after supper. Take one tablet (20mg ) by mouth every evening after supper.   Sirivol, Mamatha, MD  Active Self, Pharmacy Records  Propylene Glycol 0.6 % SOLN [id number] Yes Place 1 drop into both eyes in the morning. [provider]  Active Self, Pharmacy Records  XARELTO  15 MG TABS tablet  [id number] Yes TAKE ONE TABLET BY MOUTH DAILY with supper  Patient taking differently: Take 15 mg by mouth daily after supper. Take one tablet (15mg ) by mouth daily after supper.   Waddell Danelle ORN, MD  Active Self, Pharmacy Records            Home Care and Equipment/Supplies: Were Home Health Services Ordered?: NA Any new  equipment or medical supplies ordered?: NA  Functional Questionnaire: Do you need assistance with bathing/showering or dressing?: No Do you need assistance with meal preparation?: No Do you need assistance with eating?: No Do you have difficulty maintaining continence: No Do you need assistance with getting out of bed/getting out of a chair/moving?: No Do you have difficulty managing or taking your medications?: No  Follow up appointments reviewed: PCP Follow-up appointment confirmed?: Yes Date of PCP follow-up appointment?: 05/17/24 Follow-up Provider: [facility] Follow-up appointment confirmed?: No Reason Specialist Follow-Up Not Confirmed: Patient has Specialist Provider Number and will Call for Appointment Do you need transportation to your follow-up appointment?: No Do you understand care options if your condition(s) worsen?: Yes-patient verbalized understanding    SIGNATURE Julian Lemmings, LPN [facility] Nurse Health Advisor Direct Dial [phone]

## 2024-05-12 ENCOUNTER — Ambulatory Visit: Payer: Self-pay

## 2024-05-12 NOTE — Telephone Encounter (Signed)
 FYI Only or Action Required?: Action required by provider: Med request.  Patient was last seen in primary care on 11/23/2023 by Sherre Clapper, MD.  Called Nurse Triage reporting Medication Problem.  Symptoms began several days ago.  Interventions attempted: Other: Vagisil .  Symptoms are: gradually worsening.  Triage Disposition: See PCP When Office is Open (Within 3 Days)  Patient/caregiver understands and will follow disposition?: Yes          Message from [facility] C sent at 05/12/2024  8:55 AM EST  Summary: personal discomfort / rx req   Reason for Triage: The patient has been recently seen at the hospital for urinary concerns and shares that they now need a prescription for Vagisil to help with continued discomfort. The patient would like to speak with a member of clinical staff further to discuss more in depth.         Reason for Disposition  Prescription request for new medicine (not a refill)  Answer Assessment - Initial Assessment Questions This RN spoke with pt regarding symptoms. Requesting medication to be sent to pharmacy below. Pt already scheduled for an appointment in office on 05/17/2024.   [facility] - [place], [place] - [address]  [address], [place] [address]     1. NAME of MEDICINE: What medicine(s) are you calling about?     Vagisil 2. QUESTION: What is your question? (e.g., double dose of medicine, side effect)     Requesting a prescription for the medication  3. PRESCRIBER: Who prescribed the medicine? Reason: if prescribed by specialist, call should be referred to that group.     Unsure she states it was an old medication, not on active med list  4. SYMPTOMS: Do you have any symptoms? If Yes, ask: What symptoms are you having?  How bad are the symptoms (e.g., mild, moderate, severe)     Vaginal discomfort  Protocols used: Medication Question Call-A-AH

## 2024-05-12 NOTE — Telephone Encounter (Signed)
 Patient informed vagisil is available without a rx.

## 2024-05-16 ENCOUNTER — Other Ambulatory Visit: Payer: Self-pay | Admitting: Family Medicine

## 2024-05-16 LAB — LAB REPORT - SCANNED: EGFR: 23

## 2024-05-17 ENCOUNTER — Encounter: Payer: Self-pay | Admitting: Family Medicine

## 2024-05-17 ENCOUNTER — Ambulatory Visit: Admitting: Family Medicine

## 2024-05-17 VITALS — BP 136/72 | HR 68 | Temp 97.8°F | Ht 63.0 in | Wt 123.8 lb

## 2024-05-17 DIAGNOSIS — M7918 Myalgia, other site: Secondary | ICD-10-CM | POA: Diagnosis not present

## 2024-05-17 DIAGNOSIS — R0789 Other chest pain: Secondary | ICD-10-CM | POA: Diagnosis not present

## 2024-05-17 DIAGNOSIS — N184 Chronic kidney disease, stage 4 (severe): Secondary | ICD-10-CM | POA: Diagnosis not present

## 2024-05-17 DIAGNOSIS — Z23 Encounter for immunization: Secondary | ICD-10-CM | POA: Diagnosis not present

## 2024-05-17 NOTE — Assessment & Plan Note (Addendum)
 Chest wall pain resolved. Differential diagnosis included costochondritis and pleuritis. Cardiology follow-up scheduled. - Continue with cardiology follow-up on January 7th.

## 2024-05-17 NOTE — Assessment & Plan Note (Addendum)
 Chronic kidney disease with stable kidney function. Recent labs show stable blood count and kidney function.

## 2024-05-17 NOTE — Patient Instructions (Signed)
  VISIT SUMMARY: Today, we followed up on your recent ER visit for chest pain, which has since resolved. We also reviewed your urinary tract infection, kidney function, and general health maintenance.  YOUR PLAN: CHEST PAIN: Your chest pain, which led to an ER visit, has resolved. The pain was likely due to costochondritis or pleuritis. -Continue with your cardiology follow-up on January 7th.  URINARY TRACT INFECTION: You developed a urinary tract infection during your hospital stay, which has been treated and resolved. -Continue with good hygiene practices to prevent future infections.  CHRONIC KIDNEY DISEASE: Your chronic kidney disease remains stable with recent labs showing stable kidney function and blood counts. -Continue with your current management plan and follow up with nephrology as needed.  MUSCULOSKELETAL PAIN: You have occasional musculoskeletal pain, particularly in your back, but no acute intervention is required at this time. -Monitor your symptoms and use over-the-counter pain relief if needed.  GENERAL HEALTH MAINTENANCE: We discussed your vaccinations today. You have not received a flu shot this year and are considering the shingles vaccine series. -You received a high-dose flu shot today. -Consider getting the shingles vaccine series at your pharmacy.                      Contains text generated by [employer].                                 Contains text generated by [employer].

## 2024-05-17 NOTE — Progress Notes (Signed)
 Subjective:  Patient ID: Tracy Vasquez, female    DOB: December 10, 1929  Age: 89 y.o. MRN: [id number]  Chief Complaint  Patient presents with   Hospital follow up   Discussed the use of AI scribe software for clinical note transcription with the patient, who gave verbal consent to proceed.  History of Present Illness Tracy Vasquez is a 89 year old female who presents for follow-up after an ER visit for chest pain.  Chest pain - Experienced acute chest pain leading to an 18-hour emergency room stay - Pain resolved approximately 7 to 8 hours after hospital arrival - Pain localized just to the right of the sternum - Pain exacerbated by deep breathing - No recurrence of chest pain since the ER visit - Similar episode 25 years ago diagnosed as pleuritis or costochondritis - No current chest pain, shortness of breath, or palpitations  Urinary tract infection - Developed urinary tract infection during hospital stay, attributed to prolonged immobility - Treated with a four-day course of antibiotics - Urine has been clear for the past two days - Manages symptoms with hygiene practices - No dysuria, hematuria, or urinary frequency reported  Renal function - History of kidney issues - Recent nephrology follow-up with blood work - Blood counts remain stable  Musculoskeletal symptoms - Occasional skeletal pain, particularly in the back - No myalgia, arthralgia, or back pain at present  General symptoms - No fevers, chills, sweats, earaches, sore throat, nasal congestion, respiratory issues, or gastrointestinal problems        09/28/2023    2:17 PM 10/27/2022    1:38 PM 09/03/2022   10:10 AM 08/06/2021   10:16 AM 03/27/2020    1:47 PM  Depression screen PHQ 2/9  Decreased Interest 0 0 0 0 0  Down, Depressed, Hopeless 0 0 0 0 0  PHQ - 2 Score 0 0 0 0 0        10/01/2023    8:54 PM  Fall Risk   Falls in the past year? 0  Number falls in past yr: 0  Injury with Fall? 0  Risk for fall  due to : History of fall(s)  Follow up Falls evaluation completed    Patient Care Team: Sherre Clapper, MD as PCP - General (Family Medicine) Waddell Danelle ORN, MD (Cardiology) Jerrye Katheryn BROCKS, MD as Consulting Physician (Nephrology)   Review of Systems  Constitutional:  Negative for appetite change, fatigue and fever.  HENT:  Negative for congestion, ear pain, sinus pressure and sore throat.   Respiratory:  Negative for cough, chest tightness, shortness of breath and wheezing.   Cardiovascular:  Negative for chest pain and palpitations.  Gastrointestinal:  Negative for abdominal pain, constipation, diarrhea, nausea and vomiting.  Genitourinary:  Negative for dysuria and hematuria.  Musculoskeletal:  Negative for arthralgias, back pain, joint swelling and myalgias.  Skin:  Negative for rash.  Neurological:  Negative for dizziness, weakness and headaches.  Psychiatric/Behavioral:  Negative for dysphoric mood. The patient is not nervous/anxious.     Current Outpatient Medications on File Prior to Visit  Medication Sig Dispense Refill   acetaminophen  (TYLENOL ) 500 MG tablet Take 1,000 mg by mouth 2 (two) times daily. Take two tablets (1000mg ) by mouth twice a day at 0300 and 1500.     Cholecalciferol 50 MCG (2000 UT) CAPS Take 2,000 Units by mouth daily after supper. Take one softgel (2000iu) by mouth daily after supper     digoxin  (LANOXIN ) 0.125 MG tablet TAKE 1/2 TABLET  BY MOUTH EVERY OTHER DAY (Patient taking differently: Take 0.0625 mg by mouth every other day. Take one-half tablet (0.0625mg ) by mouth every other day after supper.) 45 tablet 3   febuxostat  (ULORIC ) 40 MG tablet TAKE 1/2 TABLET BY MOUTH DAILY 45 tablet 1   furosemide  (LASIX ) 20 MG tablet 2 in am and 3 in pm. (Patient taking differently: Take 40-60 mg by mouth 2 (two) times daily. Take three tablets (60mg ) by mouth in the morning,  and two tablets  (40mg ) in the evening) 1 tablet 0   LORazepam  (ATIVAN ) 0.5 MG tablet Take 1  tablet (0.5 mg total) by mouth at bedtime. (Patient taking differently: Take 0.5 mg by mouth at bedtime as needed for sleep.) 30 tablet 2   metoprolol  tartrate (LOPRESSOR ) 100 MG tablet Take 1 tablet (100 mg total) by mouth 2 (two) times daily. 180 tablet 3   OVER THE COUNTER MEDICATION Apply 1 Application topically daily as needed (Leg  and back pain). MagniLife Leg and Back Pain Relief Cream     pravastatin  (PRAVACHOL ) 20 MG tablet TAKE 1 TABLET(20 MG) BY MOUTH DAILY (Patient taking differently: Take 20 mg by mouth daily after supper. Take one tablet (20mg ) by mouth every evening after supper.) 90 tablet 1   Propylene Glycol 0.6 % SOLN Place 1 drop into both eyes in the morning.     XARELTO  15 MG TABS tablet TAKE ONE TABLET BY MOUTH DAILY with supper (Patient taking differently: Take 15 mg by mouth daily after supper. Take one tablet (15mg ) by mouth daily after supper.) 90 tablet 1   No current facility-administered medications on file prior to visit.   Past Medical History:  Diagnosis Date   Acute gangrenous appendicitis with perforation and peritonitis 11/22/2011   Afib (HCC) 2008   CVA (cerebral infarction) 2008   no residual deficits   Osteoporosis    Tachycardia-bradycardia syndrome (HCC) 01/28/2021   Past Surgical History:  Procedure Laterality Date   INSERT / REPLACE / REMOVE PACEMAKER     LAPAROSCOPIC APPENDECTOMY  11/22/2011   Procedure: APPENDECTOMY LAPAROSCOPIC;  Surgeon: Jina Nephew, MD;  Location: [facility] OR;  Service: General;  Laterality: N/A;   PACEMAKER INSERTION     PPM GENERATOR CHANGEOUT N/A 08/16/2017   Procedure: PPM GENERATOR CHANGEOUT;  Surgeon: Waddell Danelle ORN, MD;  Location: [facility] INVASIVE CV LAB;  Service: Cardiovascular;  Laterality: N/A;   RIGHT/LEFT HEART CATH AND CORONARY ANGIOGRAPHY N/A 02/01/2017   Procedure: Right/Left Heart Cath and Coronary Angiography;  Surgeon: Burnard Debby LABOR, MD;  Location: [facility] INVASIVE CV LAB;  Service: Cardiovascular;  Laterality: N/A;    TONSILLECTOMY AND ADENOIDECTOMY  1935    Family History  Problem Relation Age of Onset   Coronary artery disease Mother    Heart attack Mother    Atrial fibrillation Father    Lung cancer Father    Alcohol abuse Brother    Failure to thrive Brother    Social History   Socioeconomic History   Marital status: Widowed    Spouse name: Not on file   Number of children: 3   Years of education: Not on file   Highest education level: Not on file  Occupational History   Occupation: Retired  Tobacco Use   Smoking status: Former    Current packs/day: 0.00    Types: Cigarettes    Quit date: 1975    Years since quitting: 50.9   Smokeless tobacco: Never  Vaping Use   Vaping status: Never Used  Substance and Sexual Activity   Alcohol use: No   Drug use: No   Sexual activity: Not Currently  Other Topics Concern   Not on file  Social History Narrative   Lives with dtr, moved to [place] from [facility] in 2014, widowed early 2013 (60 years married)   Social Drivers of Corporate Investment Banker Strain: Low Risk  (09/28/2023)   Overall Financial Resource Strain (CARDIA)    Difficulty of Paying Living Expenses: Not hard at all  Food Insecurity: No Food Insecurity (05/08/2024)   Hunger Vital Sign    Worried About Running Out of Food in the Last Year: Never true    Ran Out of Food in the Last Year: Never true  Transportation Needs: No Transportation Needs (05/08/2024)   PRAPARE - Administrator, Civil Service (Medical): No    Lack of Transportation (Non-Medical): No  Physical Activity: Sufficiently Active (11/23/2023)   Exercise Vital Sign    Days of Exercise per Week: 3 days    Minutes of Exercise per Session: 60 min  Stress: No Stress Concern Present (09/28/2023)   [employer] of Occupational Health - Occupational Stress Questionnaire    Feeling of Stress : Not at all  Social Connections: Moderately Isolated (05/08/2024)   Social Connection and Isolation Panel     Frequency of Communication with Friends and Family: More than three times a week    Frequency of Social Gatherings with Friends and Family: More than three times a week    Attends Religious Services: 1 to 4 times per year    Active Member of [employer] or Organizations: No    Attends Banker Meetings: Never    Marital Status: Widowed    Objective:  BP 136/72 (BP Location: Left Arm, Patient Position: Sitting)   Pulse 68   Temp 97.8 F (36.6 C) (Temporal)   Ht 5' 3 (1.6 m)   Wt 123 lb 12.8 oz (56.2 kg)   SpO2 98%   BMI 21.93 kg/m      05/17/2024   11:05 AM 05/09/2024    9:22 AM 05/09/2024    4:35 AM  BP/Weight  Systolic BP 136 106 116   116   96  Diastolic BP 72 49 62   62   82  Wt. (Lbs) 123.8    BMI 21.93 kg/m2      Physical Exam Vitals reviewed.  Constitutional:      Appearance: Normal appearance.  Neck:     Vascular: No carotid bruit.  Cardiovascular:     Rate and Rhythm: Normal rate and regular rhythm.     Heart sounds: Normal heart sounds.  Pulmonary:     Effort: Pulmonary effort is normal. No respiratory distress.     Breath sounds: Normal breath sounds.  Abdominal:     General: Abdomen is flat. Bowel sounds are normal.     Palpations: Abdomen is soft.     Tenderness: There is no abdominal tenderness.  Neurological:     Mental Status: She is alert.  Psychiatric:        Mood and Affect: Mood normal.        Behavior: Behavior normal.     {Perform Simple Foot Exam  Perform Detailed exam:1} {Insert foot Exam (Optional):30965}   Lab Results  Component Value Date   WBC 21.8 (H) 05/09/2024   HGB 11.6 (L) 05/09/2024   HCT 36.1 05/09/2024   PLT 229 05/09/2024   GLUCOSE 114 (H) 05/09/2024  CHOL 144 09/28/2023   TRIG 107 09/28/2023   HDL 54 09/28/2023   LDLCALC 71 09/28/2023   ALT 10 05/08/2024   AST 17 05/08/2024   NA 137 05/09/2024   K 3.6 05/09/2024   CL 95 (L) 05/09/2024   CREATININE 1.65 (H) 05/09/2024   BUN 34 (H) 05/09/2024    CO2 28 05/09/2024   TSH 2.190 03/05/2017   INR 1.4 (H) 05/07/2024   HGBA1C 6.3 (H) 09/28/2023    Results for orders placed or performed during the hospital encounter of 05/07/24  Basic metabolic panel   Collection Time: 05/07/24 10:54 PM  Result Value Ref Range   Sodium 135 135 - 145 mmol/L   Potassium 4.7 3.5 - 5.1 mmol/L   Chloride 96 (L) 98 - 111 mmol/L   CO2 27 22 - 32 mmol/L   Glucose, Bld 114 (H) 70 - 99 mg/dL   BUN 37 (H) 8 - 23 mg/dL   Creatinine, Ser 8.37 (H) 0.44 - 1.00 mg/dL   Calcium 9.1 8.9 - 89.6 mg/dL   GFR, Estimated 30 (L) >60 mL/min   Anion gap 12 5 - 15  CBC   Collection Time: 05/07/24 10:54 PM  Result Value Ref Range   WBC 18.4 (H) 4.0 - 10.5 K/uL   RBC 4.18 3.87 - 5.11 MIL/uL   Hemoglobin 12.2 12.0 - 15.0 g/dL   HCT 61.1 63.9 - 53.9 %   MCV 92.8 80.0 - 100.0 fL   MCH 29.2 26.0 - 34.0 pg   MCHC 31.4 30.0 - 36.0 g/dL   RDW 86.2 88.4 - 84.4 %   Platelets 242 150 - 400 K/uL   nRBC 0.0 0.0 - 0.2 %  Protime-INR (order if Patient is taking Coumadin  / Warfarin)   Collection Time: 05/07/24 10:54 PM  Result Value Ref Range   Prothrombin Time 17.9 (H) 11.4 - 15.2 seconds   INR 1.4 (H) 0.8 - 1.2  Troponin I (High Sensitivity)   Collection Time: 05/07/24 10:54 PM  Result Value Ref Range   Troponin I (High Sensitivity) 11 <18 ng/L  I-stat chem 8, ED (not at [facility], [facility] or [facility])   Collection Time: 05/07/24 10:58 PM  Result Value Ref Range   Sodium 136 135 - 145 mmol/L   Potassium 4.5 3.5 - 5.1 mmol/L   Chloride 99 98 - 111 mmol/L   BUN 39 (H) 8 - 23 mg/dL   Creatinine, Ser 8.29 (H) 0.44 - 1.00 mg/dL   Glucose, Bld 882 (H) 70 - 99 mg/dL   Calcium, Ion 8.88 (L) 1.15 - 1.40 mmol/L   TCO2 27 22 - 32 mmol/L   Hemoglobin 12.9 12.0 - 15.0 g/dL   HCT 61.9 63.9 - 53.9 %  Troponin I (High Sensitivity)   Collection Time: 05/08/24  1:16 AM  Result Value Ref Range   Troponin I (High Sensitivity) 14 <18 ng/L  Basic metabolic panel   Collection Time: 05/08/24  5:42 AM   Result Value Ref Range   Sodium 136 135 - 145 mmol/L   Potassium 4.1 3.5 - 5.1 mmol/L   Chloride 98 98 - 111 mmol/L   CO2 24 22 - 32 mmol/L   Glucose, Bld 104 (H) 70 - 99 mg/dL   BUN 37 (H) 8 - 23 mg/dL   Creatinine, Ser 8.28 (H) 0.44 - 1.00 mg/dL   Calcium 9.0 8.9 - 89.6 mg/dL   GFR, Estimated 28 (L) >60 mL/min   Anion gap 14 5 - 15  Hepatic function  panel   Collection Time: 05/08/24  5:42 AM  Result Value Ref Range   Total Protein 6.7 6.5 - 8.1 g/dL   Albumin 2.9 (L) 3.5 - 5.0 g/dL   AST 17 15 - 41 U/L   ALT 10 0 - 44 U/L   Alkaline Phosphatase 68 38 - 126 U/L   Total Bilirubin 1.0 0.0 - 1.2 mg/dL   Bilirubin, Direct 0.2 0.0 - 0.2 mg/dL   Indirect Bilirubin 0.8 0.3 - 0.9 mg/dL  CBC with Differential/Platelet   Collection Time: 05/08/24  5:42 AM  Result Value Ref Range   WBC 20.3 (H) 4.0 - 10.5 K/uL   RBC 3.73 (L) 3.87 - 5.11 MIL/uL   Hemoglobin 10.9 (L) 12.0 - 15.0 g/dL   HCT 65.3 (L) 63.9 - 53.9 %   MCV 92.8 80.0 - 100.0 fL   MCH 29.2 26.0 - 34.0 pg   MCHC 31.5 30.0 - 36.0 g/dL   RDW 86.0 88.4 - 84.4 %   Platelets 232 150 - 400 K/uL   nRBC 0.0 0.0 - 0.2 %   Neutrophils Relative % 76 %   Neutro Abs 15.3 (H) 1.7 - 7.7 K/uL   Lymphocytes Relative 15 %   Lymphs Abs 3.1 0.7 - 4.0 K/uL   Monocytes Relative 8 %   Monocytes Absolute 1.6 (H) 0.1 - 1.0 K/uL   Eosinophils Relative 0 %   Eosinophils Absolute 0.0 0.0 - 0.5 K/uL   Basophils Relative 0 %   Basophils Absolute 0.1 0.0 - 0.1 K/uL   Immature Granulocytes 1 %   Abs Immature Granulocytes 0.17 (H) 0.00 - 0.07 K/uL  Digoxin  level   Collection Time: 05/08/24  5:42 AM  Result Value Ref Range   Digoxin  Level <0.6 (L) 0.8 - 2.0 ng/mL  APTT   Collection Time: 05/08/24  5:42 AM  Result Value Ref Range   aPTT 36 24 - 36 seconds  Heparin  level (unfractionated)   Collection Time: 05/08/24  5:42 AM  Result Value Ref Range   Heparin  Unfractionated >1.10 (H) 0.30 - 0.70 IU/mL  Brain natriuretic peptide   Collection  Time: 05/08/24  5:42 AM  Result Value Ref Range   B Natriuretic Peptide 1,033.2 (H) 0.0 - 100.0 pg/mL  ECHOCARDIOGRAM COMPLETE   Collection Time: 05/08/24  8:57 AM  Result Value Ref Range   Weight 2,000 oz   Height 63 in   BP 101/80 mmHg   S' Lateral 1.90 cm   Area-P 1/2 6.12 cm2   Est EF 50 - 55%   C-reactive protein   Collection Time: 05/08/24  3:22 PM  Result Value Ref Range   CRP 13.3 (H) <1.0 mg/dL  Sedimentation rate   Collection Time: 05/08/24  3:22 PM  Result Value Ref Range   Sed Rate 63 (H) 0 - 22 mm/hr  Urinalysis, Routine w reflex microscopic -Urine, Clean Catch   Collection Time: 05/09/24  4:19 AM  Result Value Ref Range   Color, Urine AMBER (A) YELLOW   APPearance TURBID (A) CLEAR   Specific Gravity, Urine 1.013 1.005 - 1.030   pH 5.0 5.0 - 8.0   Glucose, UA NEGATIVE NEGATIVE mg/dL   Hgb urine dipstick LARGE (A) NEGATIVE   Bilirubin Urine NEGATIVE NEGATIVE   Ketones, ur NEGATIVE NEGATIVE mg/dL   Protein, ur 30 (A) NEGATIVE mg/dL   Nitrite NEGATIVE NEGATIVE   Leukocytes,Ua LARGE (A) NEGATIVE   RBC / HPF 21-50 0 - 5 RBC/hpf   WBC, UA >50 0 -  5 WBC/hpf   Bacteria, UA MANY (A) NONE SEEN   Squamous Epithelial / HPF 0-5 0 - 5 /HPF   Mucus PRESENT   CBC   Collection Time: 05/09/24  4:40 AM  Result Value Ref Range   WBC 21.8 (H) 4.0 - 10.5 K/uL   RBC 3.99 3.87 - 5.11 MIL/uL   Hemoglobin 11.6 (L) 12.0 - 15.0 g/dL   HCT 63.8 63.9 - 53.9 %   MCV 90.5 80.0 - 100.0 fL   MCH 29.1 26.0 - 34.0 pg   MCHC 32.1 30.0 - 36.0 g/dL   RDW 86.0 88.4 - 84.4 %   Platelets 229 150 - 400 K/uL   nRBC 0.0 0.0 - 0.2 %  Basic metabolic panel with GFR   Collection Time: 05/09/24  4:40 AM  Result Value Ref Range   Sodium 137 135 - 145 mmol/L   Potassium 3.6 3.5 - 5.1 mmol/L   Chloride 95 (L) 98 - 111 mmol/L   CO2 28 22 - 32 mmol/L   Glucose, Bld 114 (H) 70 - 99 mg/dL   BUN 34 (H) 8 - 23 mg/dL   Creatinine, Ser 8.34 (H) 0.44 - 1.00 mg/dL   Calcium 9.4 8.9 - 89.6 mg/dL   GFR,  Estimated 29 (L) >60 mL/min   Anion gap 14 5 - 15  Magnesium   Collection Time: 05/09/24  4:40 AM  Result Value Ref Range   Magnesium 2.2 1.7 - 2.4 mg/dL  Phosphorus   Collection Time: 05/09/24  4:40 AM  Result Value Ref Range   Phosphorus 3.2 2.5 - 4.6 mg/dL  .  Assessment & Plan:   Assessment & Plan Atypical chest pain Chest wall pain resolved. Differential diagnosis included costochondritis and pleuritis. Cardiology follow-up scheduled. - Continue with cardiology follow-up on January 7th.    Chronic kidney disease, stage 4 (severe) (HCC) Chronic kidney disease with stable kidney function. Recent labs show stable blood count and kidney function.    Musculoskeletal pain Intermittent musculoskeletal pain, particularly in the back. No acute intervention required.    Encounter for immunization  Orders:   Flu vaccine HIGH DOSE PF(Fluzone Trivalent)  Body mass index is 21.93 kg/m.   No orders of the defined types were placed in this encounter.   Orders Placed This Encounter  Procedures   Flu vaccine HIGH DOSE PF(Fluzone Trivalent)     I,Marla I Leal-Borjas,acting as a scribe for Abigail Free, MD.,have documented all relevant documentation on the behalf of Abigail Free, MD,as directed by  Abigail Free, MD while in the presence of Abigail Free, MD.   Follow-up: No follow-ups on file.  An After Visit Summary was printed and given to the patient.  Abigail Free, MD [facility] [phone]

## 2024-05-20 ENCOUNTER — Other Ambulatory Visit: Payer: Self-pay | Admitting: Family Medicine

## 2024-05-20 DIAGNOSIS — F5101 Primary insomnia: Secondary | ICD-10-CM

## 2024-05-20 DIAGNOSIS — M7918 Myalgia, other site: Secondary | ICD-10-CM | POA: Insufficient documentation

## 2024-05-20 NOTE — Assessment & Plan Note (Signed)
 Intermittent musculoskeletal pain, particularly in the back. No acute intervention required.

## 2024-05-25 ENCOUNTER — Ambulatory Visit: Payer: Self-pay | Admitting: Internal Medicine

## 2024-05-25 ENCOUNTER — Ambulatory Visit

## 2024-05-25 DIAGNOSIS — I482 Chronic atrial fibrillation, unspecified: Secondary | ICD-10-CM

## 2024-05-25 LAB — CUP PACEART REMOTE DEVICE CHECK
Battery Remaining Longevity: 12 mo
Battery Remaining Percentage: 25 %
Brady Statistic RA Percent Paced: 0 %
Brady Statistic RV Percent Paced: 15 %
Date Time Interrogation Session: 20251120030200
Implantable Lead Connection Status: 753985
Implantable Lead Connection Status: 753985
Implantable Lead Implant Date: 20080827
Implantable Lead Implant Date: 20080827
Implantable Lead Location: 753859
Implantable Lead Location: 753860
Implantable Lead Model: 4135
Implantable Lead Model: 4136
Implantable Lead Serial Number: 28356762
Implantable Lead Serial Number: 28365470
Implantable Pulse Generator Implant Date: 20190211
Lead Channel Impedance Value: 560 Ohm
Lead Channel Pacing Threshold Amplitude: 0.9 V
Lead Channel Pacing Threshold Pulse Width: 0.4 ms
Lead Channel Setting Pacing Amplitude: 1.2 V
Lead Channel Setting Pacing Pulse Width: 0.4 ms
Lead Channel Setting Sensing Sensitivity: 2.5 mV
Pulse Gen Serial Number: 396317
Zone Setting Status: 755011

## 2024-05-29 NOTE — Progress Notes (Signed)
 Remote PPM Transmission

## 2024-06-06 ENCOUNTER — Other Ambulatory Visit: Payer: Self-pay

## 2024-06-10 ENCOUNTER — Other Ambulatory Visit: Payer: Self-pay

## 2024-06-20 ENCOUNTER — Other Ambulatory Visit: Payer: Self-pay

## 2024-06-20 DIAGNOSIS — N184 Chronic kidney disease, stage 4 (severe): Secondary | ICD-10-CM

## 2024-07-07 ENCOUNTER — Other Ambulatory Visit

## 2024-07-07 DIAGNOSIS — N184 Chronic kidney disease, stage 4 (severe): Secondary | ICD-10-CM

## 2024-07-08 LAB — CBC WITH DIFFERENTIAL/PLATELET
Basophils Absolute: 0.1 x10E3/uL (ref 0.0–0.2)
Basos: 1 %
EOS (ABSOLUTE): 0.2 x10E3/uL (ref 0.0–0.4)
Eos: 2 %
Hematocrit: 38.9 % (ref 34.0–46.6)
Hemoglobin: 12.1 g/dL (ref 11.1–15.9)
Immature Grans (Abs): 0 x10E3/uL (ref 0.0–0.1)
Immature Granulocytes: 0 %
Lymphocytes Absolute: 2.3 x10E3/uL (ref 0.7–3.1)
Lymphs: 26 %
MCH: 28.3 pg (ref 26.6–33.0)
MCHC: 31.1 g/dL — ABNORMAL LOW (ref 31.5–35.7)
MCV: 91 fL (ref 79–97)
Monocytes Absolute: 1 x10E3/uL — ABNORMAL HIGH (ref 0.1–0.9)
Monocytes: 11 %
Neutrophils Absolute: 5.3 x10E3/uL (ref 1.4–7.0)
Neutrophils: 60 %
Platelets: 284 x10E3/uL (ref 150–450)
RBC: 4.27 x10E6/uL (ref 3.77–5.28)
RDW: 13.9 % (ref 11.7–15.4)
WBC: 8.8 x10E3/uL (ref 3.4–10.8)

## 2024-07-08 LAB — RENAL FUNCTION PANEL
Albumin: 4 g/dL (ref 3.6–4.6)
BUN/Creatinine Ratio: 22 (ref 12–28)
BUN: 37 mg/dL — ABNORMAL HIGH (ref 10–36)
CO2: 27 mmol/L (ref 20–29)
Calcium: 9.8 mg/dL (ref 8.7–10.3)
Chloride: 95 mmol/L — ABNORMAL LOW (ref 96–106)
Creatinine, Ser: 1.7 mg/dL — ABNORMAL HIGH (ref 0.57–1.00)
Glucose: 111 mg/dL — ABNORMAL HIGH (ref 70–99)
Phosphorus: 4 mg/dL (ref 3.0–4.3)
Potassium: 5.4 mmol/L — ABNORMAL HIGH (ref 3.5–5.2)
Sodium: 138 mmol/L (ref 134–144)
eGFR: 28 mL/min/1.73 — ABNORMAL LOW

## 2024-07-08 LAB — PROT/CREAT RATIO, SERUM
Prot/Creat Ratio, S: 4.53
Total Protein: 7.7 g/dL (ref 6.0–8.5)

## 2024-07-08 LAB — MAGNESIUM: Magnesium: 2.5 mg/dL — ABNORMAL HIGH (ref 1.6–2.3)

## 2024-07-08 LAB — PARATHYROID HORMONE, INTACT (NO CA): PTH: 79 pg/mL — ABNORMAL HIGH (ref 15–65)

## 2024-07-12 ENCOUNTER — Ambulatory Visit: Payer: Self-pay | Admitting: Family Medicine

## 2024-08-24 ENCOUNTER — Encounter

## 2024-09-28 ENCOUNTER — Ambulatory Visit: Admitting: Family Medicine

## 2024-11-23 ENCOUNTER — Encounter

## 2025-02-22 ENCOUNTER — Encounter
